# Patient Record
Sex: Female | Born: 1975 | Race: Black or African American | Hispanic: No | Marital: Single | Smoking: Current every day smoker
Health system: Southern US, Community
[De-identification: ages and names within clinical notes are randomized; demographics above are authoritative.]

---

## 2008-11-14 ENCOUNTER — Ambulatory Visit: Payer: Self-pay | Admitting: General Practice

## 2008-12-13 ENCOUNTER — Inpatient Hospital Stay: Payer: Self-pay | Admitting: Obstetrics and Gynecology

## 2011-05-26 IMAGING — US US OB US >=[ID] SNGL FETUS
1 series · 17 of 28 positions shown · non-contrast
Comparison: none

REASON FOR EXAM: vaginal bleeding   CALL report [id number]
COMMENTS:

[Series 1: us ob us >=(id) sngl fetus · 17 of 67 slices shown]
[im 1/67]
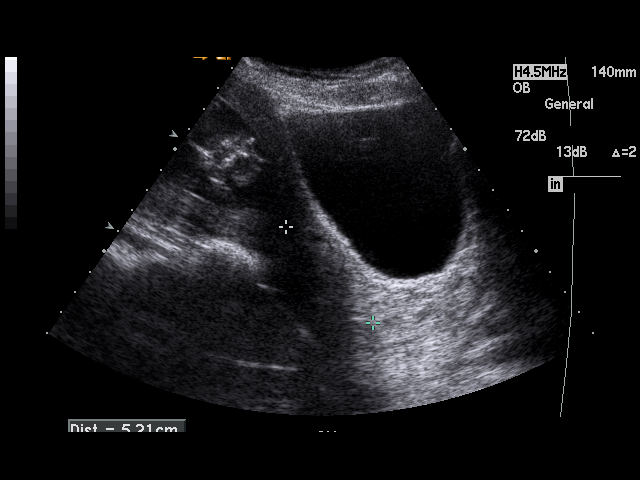
[im 5/67]
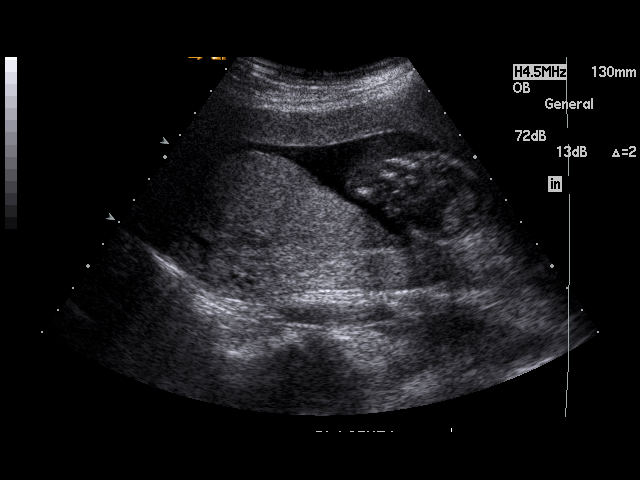
[im 10/67]
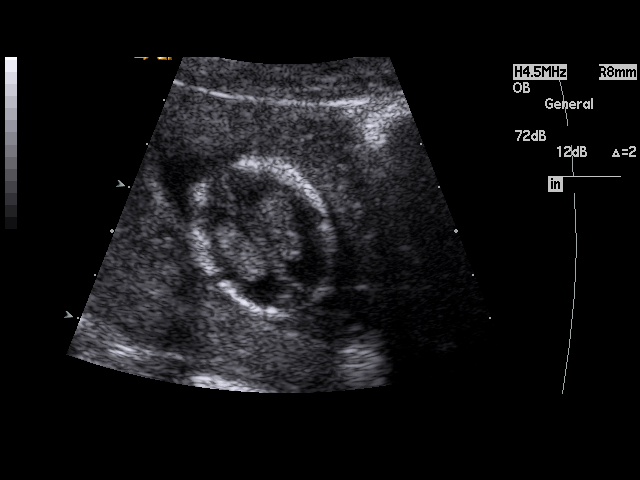
[im 13/67]
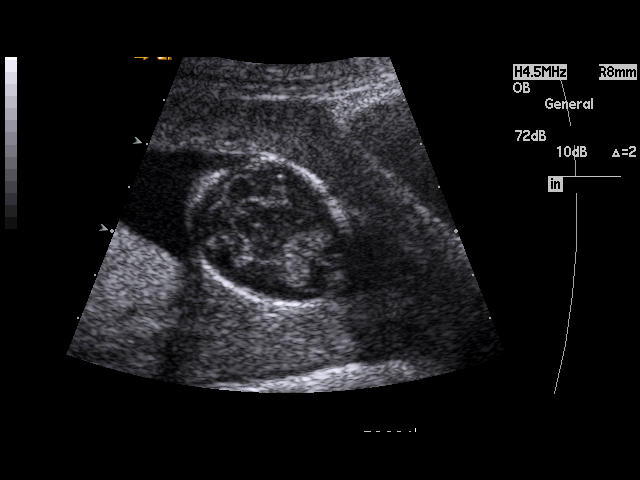
[im 18/67]
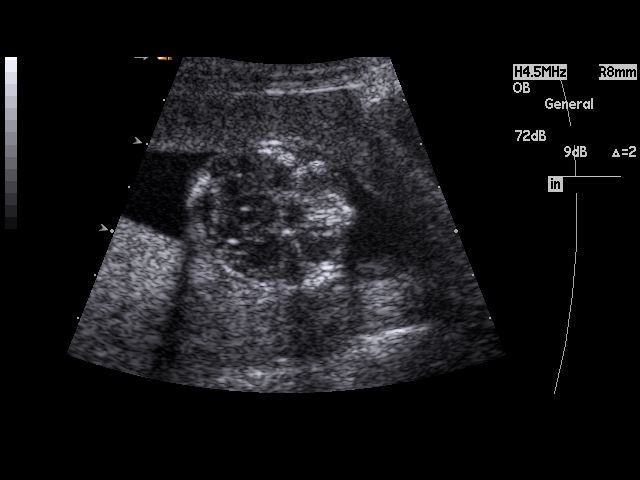
[im 23/67]
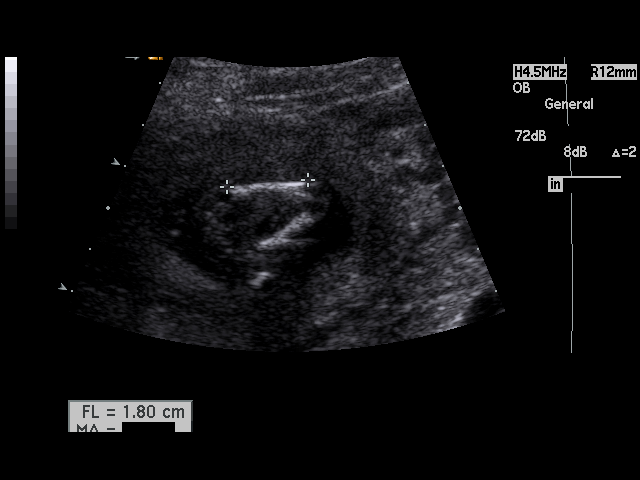
[im 25/67]
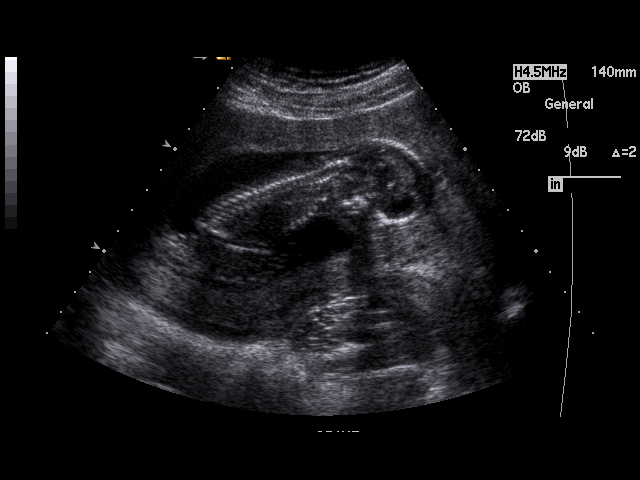
[im 30/67]
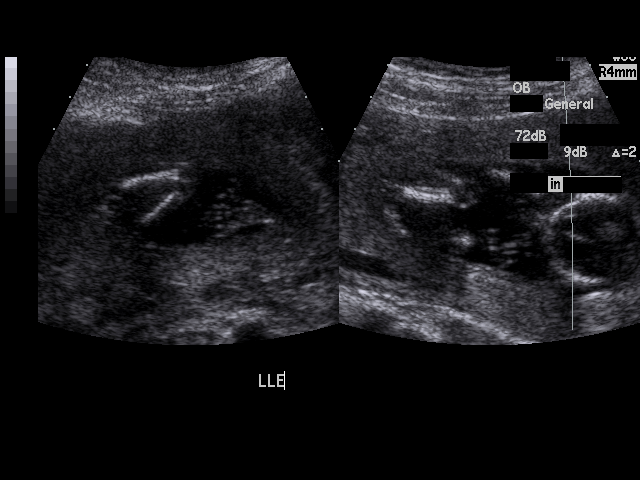
[im 35/67]
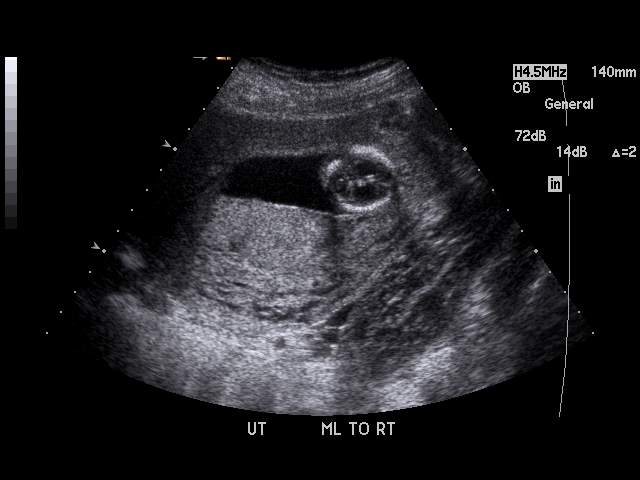
[im 37/67]
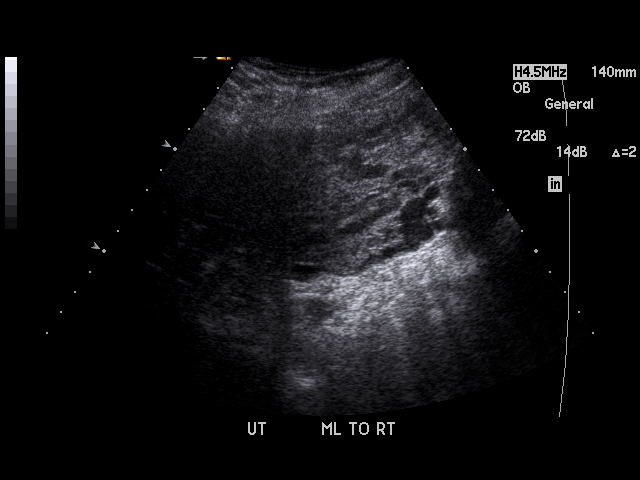
[im 42/67]
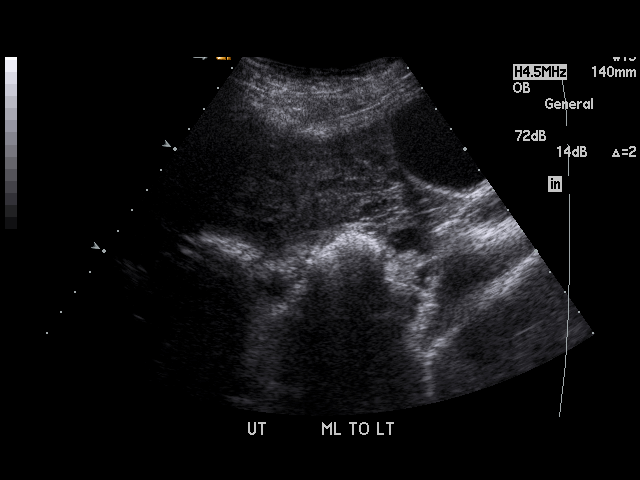
[im 45/67]
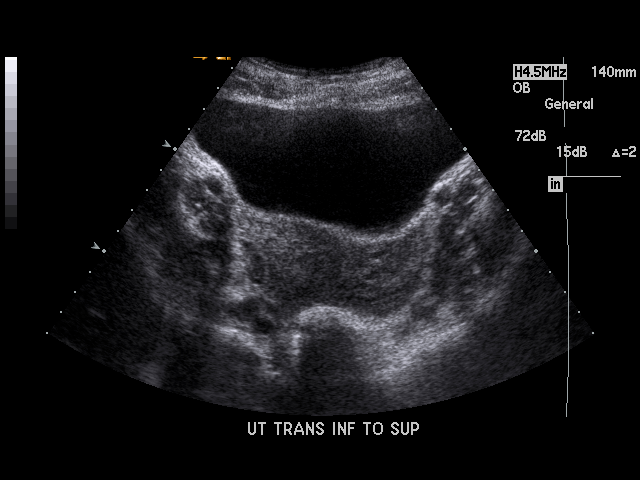
[im 49/67]
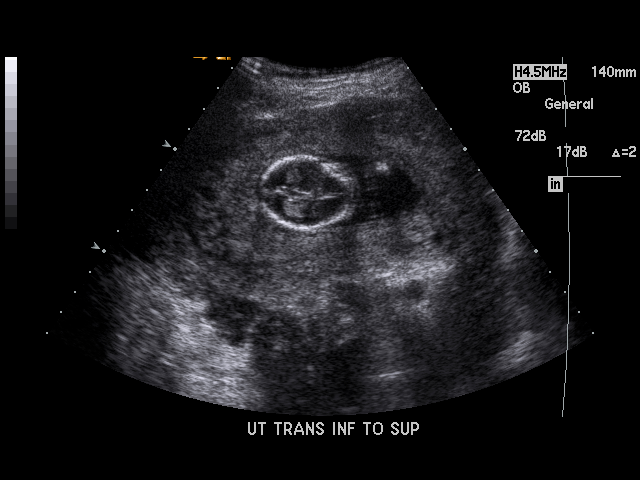
[im 54/67]
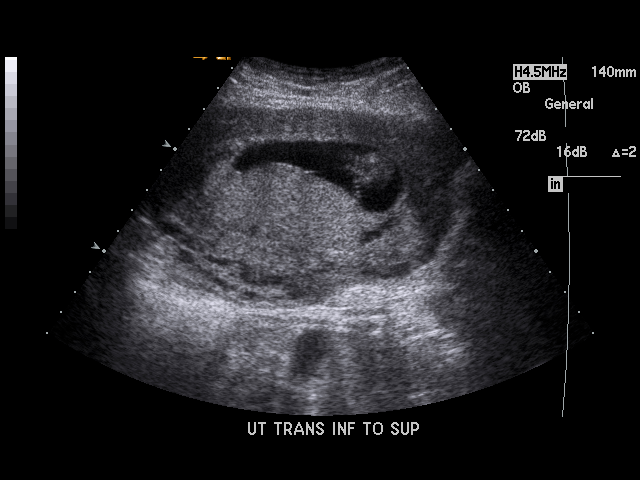
[im 57/67]
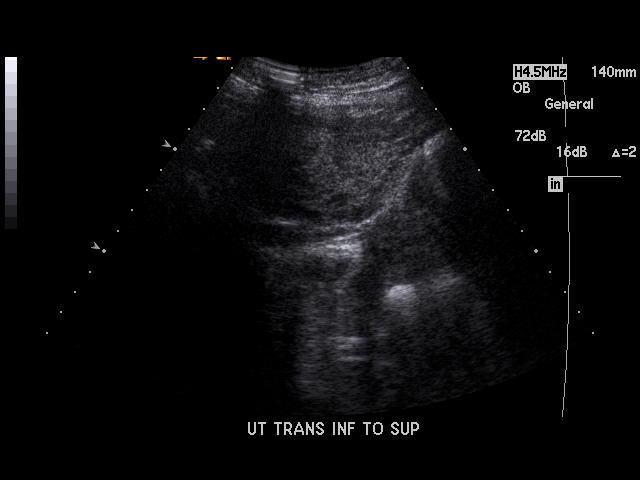
[im 62/67]
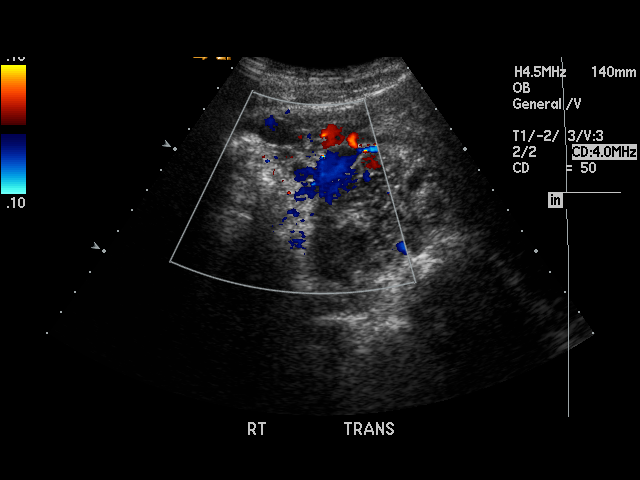
[im 67/67]
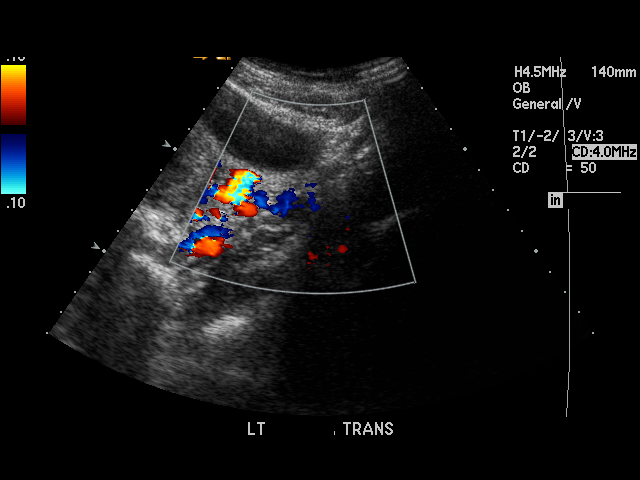

[17 of 28 positions shown; findings below may reference images not displayed]

PROCEDURE:     US  - US OB GREATER/OR EQUAL TO [id number]  - November 14, 2008  [DATE]

RESULT:     Ultrasound of the pelvis utilizing an OB protocol demonstrates a
single intrauterine fetus. The length of the cervix is 5.21 cm. The placenta
is posterior in position. The distance from the lower margin of the placenta
to the internal cervical os is 4.09 cm. Fetal cardiac, trunk and extremity
motion is demonstrated. The fetal heart rate is 153 beats per minute. There
is a cephalic presentation currently. The placenta is posterior in position
and grade 1. Gestational age is calculated to be 15 weeks one day with an
estimated fetal weight of 139 grams + / - 19 grams at this time. The fetus is
too small for accurate assessment of anatomy.  A gastric bubble is
demonstrated. Estimated delivery date sonographically is 05/07/2009.
IMPRESSION: No significant abnormality demonstrated. Single intrauterine fetus as
described. Routine followup is recommended.

## 2012-03-14 ENCOUNTER — Emergency Department: Payer: Self-pay | Admitting: Emergency Medicine

## 2013-05-14 ENCOUNTER — Emergency Department: Payer: Self-pay | Admitting: Emergency Medicine

## 2014-07-29 ENCOUNTER — Encounter: Payer: Self-pay | Admitting: Emergency Medicine

## 2014-07-29 ENCOUNTER — Emergency Department
Admission: EM | Admit: 2014-07-29 | Discharge: 2014-07-29 | Disposition: A | Discharge status: Home / Self Care | Payer: Self-pay | Attending: Emergency Medicine | Admitting: Emergency Medicine

## 2014-07-29 DIAGNOSIS — Z720 Tobacco use: Secondary | ICD-10-CM | POA: Insufficient documentation

## 2014-07-29 DIAGNOSIS — J039 Acute tonsillitis, unspecified: Secondary | ICD-10-CM

## 2014-07-29 MED ORDER — DEXAMETHASONE SODIUM PHOSPHATE 10 MG/ML IJ SOLN
10.0000 mg | Freq: Once | INTRAMUSCULAR | Status: DC
Start: 2014-07-29 — End: 2014-07-29

## 2014-07-29 MED ORDER — PENICILLIN G BENZATHINE & PROC 1200000 UNIT/2ML IM SUSP
1.2000 10*6.[IU] | Freq: Once | INTRAMUSCULAR | Status: DC
  Filled 2014-07-29: qty 2

## 2014-07-29 MED ORDER — LIDOCAINE VISCOUS 2 % MT SOLN: 20.0000 mL | OROMUCOSAL | Status: DC | PRN

## 2014-07-29 MED ORDER — DEXAMETHASONE SODIUM PHOSPHATE 10 MG/ML IJ SOLN
10.0000 mg | Freq: Once | INTRAMUSCULAR | Status: AC
  Administered 2014-07-29: 10 mg via INTRAMUSCULAR

## 2014-07-29 MED ORDER — PENICILLIN G BENZATHINE 1200000 UNIT/2ML IM SUSP
1.2000 10*6.[IU] | Freq: Once | INTRAMUSCULAR | Status: AC
  Administered 2014-07-29: 1.2 10*6.[IU] via INTRAMUSCULAR
  Filled 2014-07-29: qty 2

## 2014-07-29 MED ORDER — PENICILLIN G BENZATHINE & PROC 1200000 UNIT/2ML IM SUSP: 1.2000 10*6.[IU] | Freq: Once | INTRAMUSCULAR | Status: DC

## 2014-07-29 MED ORDER — PREDNISONE 10 MG PO TABS: ORAL_TABLET | ORAL | Status: DC

## 2014-07-29 NOTE — Discharge Instructions (Signed)

## 2014-07-29 NOTE — ED Notes (Signed)
Pt here with c/o sore throat, swollen tonsils x2 weeks. Pt reports difficulty swallowing, some difficulty breathing. Pt ambulatory to room, no distress noted.

## 2014-07-29 NOTE — ED Provider Notes (Signed)
[facility] Emergency Department Provider Note  ____________________________________________  Time seen: Approximately 1:59 PM  I have reviewed the triage vital signs and the nursing notes.   HISTORY  Chief Complaint Sore Throat    HPI Rebecca [name] is a 39 y.o. female who presents to the emergency room with complaints of a sore throat, swollen tonsils, with some difficulty swallowing and breathing. Symptoms onset about 2 weeks ago and have progressively gotten worse. She admits to having fever chills body aches.   History reviewed. No pertinent past medical history.  There are no active problems to display for this patient.   History reviewed. No pertinent past surgical history.  Current Outpatient Rx  Name  Route  Sig  Dispense  Refill  . lidocaine (XYLOCAINE) 2 % solution   Mouth/Throat   Use as directed 20 mLs in the mouth or throat as needed for mouth pain.   100 mL   0   . predniSONE (DELTASONE) 10 MG tablet      Take 6 tablets on day 1 then decrease by 1 tablet daily for six days.   21 tablet   0     Allergies Review of patient's allergies indicates no known allergies.  No family history on file.  Social History History  Substance Use Topics  . Smoking status: Current Every Day Smoker    Types: Cigarettes  . Smokeless tobacco: Not on file  . Alcohol Use: No    Review of Systems Constitutional: Positive fever, chills Eyes: No visual changes. ENT: Positive sore throat with swallowing. Cardiovascular: Denies chest pain. Respiratory: Denies shortness of breath. Gastrointestinal: No abdominal pain.  No nausea, no vomiting.  No diarrhea.  No constipation. Genitourinary: Negative for dysuria. Musculoskeletal: Negative for back pain. Skin: Negative for rash. Neurological: Negative for headaches, focal weakness or numbness.  10-point ROS otherwise negative.  ____________________________________________   PHYSICAL  EXAM:  VITAL SIGNS: ED Triage Vitals  Enc Vitals Group     BP 07/29/14 1344 151/100 mmHg     Pulse Rate 07/29/14 1344 88     Resp 07/29/14 1344 20     Temp 07/29/14 1344 101.1 F (38.4 C)     Temp Source 07/29/14 1344 Oral     SpO2 07/29/14 1344 98 %     Weight 07/29/14 1344 145 lb (65.772 kg)     Height 07/29/14 1344 5\' 5"  (1.651 m)     Head Cir --      Peak Flow --      Pain Score 07/29/14 1345 10     Pain Loc --      Pain Edu? --      Excl. in GC? --     Constitutional: Alert and oriented. Well appearing and in no acute distress. Eyes: Conjunctivae are normal. PERRL. EOMI. Head: Atraumatic. Nose: No congestion/rhinnorhea. Mouth/Throat: Mucous membranes are moist.  Oropharynx . Erythematous with 3+ tonsillar edema with exudate. Neck: No stridor.   Hematological/Lymphatic/Immunilogical: No cervical lymphadenopathy. Cardiovascular: Normal rate, regular rhythm. Grossly normal heart sounds.  Good peripheral circulation. Respiratory: Normal respiratory effort.  No retractions. Lungs CTAB. Gastrointestinal: Soft and nontender. No distention. No abdominal bruits. No CVA tenderness. Musculoskeletal: No lower extremity tenderness nor edema.  No joint effusions. Neurologic:  Normal speech and language. No gross focal neurologic deficits are appreciated. Speech is normal. No gait instability. Skin:  Skin is warm, dry and intact. No rash noted. Psychiatric: Mood and affect are normal. Speech and behavior are normal.  ____________________________________________   LABS (all labs ordered are listed, but only abnormal results are displayed)  Labs Reviewed - No data to display ____________________________________________  EKG  Deferred ____________________________________________  RADIOLOGY  Deferred ____________________________________________   PROCEDURES  Procedure(s) performed: None  Critical Care performed:  No  ____________________________________________   INITIAL IMPRESSION / ASSESSMENT AND PLAN / ED COURSE  Pertinent labs & imaging results that were available during my care of the patient were reviewed by me and considered in my medical decision making (see chart for details).  Bicillin 1.2 million units IM given. Decadron 10 mg IM given. Patient discharged home on 6 day tapering dose of prednisone and viscous lidocaine. She understands to return to the ER with worsening of symptoms. She voices no other emergency medical complaints at this time. ____________________________________________   FINAL CLINICAL IMPRESSION(S) / ED DIAGNOSES  Final diagnoses:  Tonsillitis with exudate      Evangeline Dakin, PA-C 07/29/14 1456  Sharman Cheek, MD 07/29/14 [phone]

## 2016-03-10 ENCOUNTER — Encounter: Payer: Self-pay | Admitting: *Deleted

## 2016-03-10 ENCOUNTER — Emergency Department
Admission: EM | Admit: 2016-03-10 | Discharge: 2016-03-10 | Disposition: A | Discharge status: Home / Self Care | Payer: Self-pay | Attending: Emergency Medicine | Admitting: Emergency Medicine

## 2016-03-10 DIAGNOSIS — F17210 Nicotine dependence, cigarettes, uncomplicated: Secondary | ICD-10-CM | POA: Insufficient documentation

## 2016-03-10 DIAGNOSIS — J111 Influenza due to unidentified influenza virus with other respiratory manifestations: Secondary | ICD-10-CM | POA: Insufficient documentation

## 2016-03-10 MED ORDER — PSEUDOEPH-BROMPHEN-DM 30-2-10 MG/5ML PO SYRP: 10.0000 mL | ORAL_SOLUTION | Freq: Four times a day (QID) | ORAL | 0 refills | Status: DC | PRN

## 2016-03-10 MED ORDER — FLUTICASONE PROPIONATE 50 MCG/ACT NA SUSP: 1.0000 | Freq: Two times a day (BID) | NASAL | 0 refills | Status: DC

## 2016-03-10 MED ORDER — OSELTAMIVIR PHOSPHATE 75 MG PO CAPS: 75.0000 mg | ORAL_CAPSULE | Freq: Two times a day (BID) | ORAL | 0 refills | Status: DC

## 2016-03-10 MED ORDER — ONDANSETRON 4 MG PO TBDP: 4.0000 mg | ORAL_TABLET | Freq: Three times a day (TID) | ORAL | 0 refills | Status: DC | PRN

## 2016-03-10 MED ORDER — ACETAMINOPHEN 325 MG PO TABS
ORAL_TABLET | ORAL | Status: DC
Start: 2016-03-10 — End: 2016-03-10
  Filled 2016-03-10: qty 2

## 2016-03-10 MED ORDER — ACETAMINOPHEN 325 MG PO TABS
650.0000 mg | ORAL_TABLET | Freq: Once | ORAL | Status: AC | PRN
  Administered 2016-03-10: 650 mg via ORAL

## 2016-03-10 NOTE — ED Notes (Signed)
Pt reports she has not taken anything for fever at home. PT in triage requesting food.

## 2016-03-10 NOTE — ED Provider Notes (Signed)
[facility] Emergency Department Provider Note  ____________________________________________  Time seen: Approximately 5:34 PM  I have reviewed the triage vital signs and the nursing notes.   HISTORY  Chief Complaint Influenza    HPI Rebecca [name] is a 41 y.o. female who presents emergency department complaining of sudden onset of fevers and chills, body aches, nasal congestion, sore throat, coughing, abdominal cramps. Patient reports that symptoms hit her suddenly yesterday. She has not taken any medications at home for this complaint. Patient did not get the flu shot this year. She denies any headache, visual changes, chest pain, shortness of breath, diarrhea. Patient states that she has had some nausea. She reports not having a bowel movement since yesterday.   No past medical history on file.  There are no active problems to display for this patient.   No past surgical history on file.  Prior to Admission medications   Medication Sig Start Date End Date Taking? Authorizing Provider  brompheniramine-pseudoephedrine-DM 30-2-10 MG/5ML syrup Take 10 mLs by mouth 4 (four) times daily as needed. 03/10/16   Delorise Royals Payten Beaumier, PA-C  fluticasone (FLONASE) 50 MCG/ACT nasal spray Place 1 spray into both nostrils 2 (two) times daily. 03/10/16   Delorise Royals Sohail Capraro, PA-C  lidocaine (XYLOCAINE) 2 % solution Use as directed 20 mLs in the mouth or throat as needed for mouth pain. 07/29/14   Charmayne Sheer Beers, PA-C  ondansetron (ZOFRAN-ODT) 4 MG disintegrating tablet Take 1 tablet (4 mg total) by mouth every 8 (eight) hours as needed for nausea or vomiting. 03/10/16   Delorise Royals Haiven Nardone, PA-C  oseltamivir (TAMIFLU) 75 MG capsule Take 1 capsule (75 mg total) by mouth 2 (two) times daily. 03/10/16   Delorise Royals Tyree Vandruff, PA-C  predniSONE (DELTASONE) 10 MG tablet Take 6 tablets on day 1 then decrease by 1 tablet daily for six days. 07/29/14   Evangeline Dakin, PA-C     Allergies Patient has no known allergies.  No family history on file.  Social History Social History  Substance Use Topics  . Smoking status: Current Every Day Smoker    Types: Cigarettes  . Smokeless tobacco: Never Used  . Alcohol use No     Review of Systems  Constitutional: Positive fever/chills Eyes: No visual changes.  ENT: Positive for nasal congestion and sore throat. Cardiovascular: no chest pain. Respiratory: Positive cough. No SOB. Gastrointestinal: Positive for abdominal cramps..  Positive for nausea but no vomiting.  No diarrhea.  No constipation. Genitourinary: Negative for dysuria. No hematuria Musculoskeletal: Positive for generalized body aches Skin: Negative for rash, abrasions, lacerations, ecchymosis. Neurological: Negative for headaches, focal weakness or numbness. 10-point ROS otherwise negative.  ____________________________________________   PHYSICAL EXAM:  VITAL SIGNS: ED Triage Vitals  Enc Vitals Group     BP 03/10/16 1724 124/83     Pulse Rate 03/10/16 1724 97     Resp 03/10/16 1724 16     Temp 03/10/16 1724 (!) 102.5 F (39.2 C)     Temp Source 03/10/16 1724 Oral     SpO2 03/10/16 1724 99 %     Weight 03/10/16 1725 135 lb (61.2 kg)     Height 03/10/16 1725 5\' 5"  (1.651 m)     Head Circumference --      Peak Flow --      Pain Score 03/10/16 1725 10     Pain Loc --      Pain Edu? --      Excl. in GC? --  Constitutional: Alert and oriented. Well appearing and in no acute distress. Eyes: Conjunctivae are normal. PERRL. EOMI. Head: Atraumatic. ENT:      Ears: EACs and TMs unremarkable bilaterally.      Nose: Moderate congestion/rhinnorhea.      Mouth/Throat: Mucous membranes are moist. Pharynx is mildly erythematous but nonedematous. Uvula is midline. Tonsils are unremarkable bilaterally. Neck: No stridor.  No cervical spine tenderness to palpation Hematological/Lymphatic/Immunilogical: Diffuse,, nontender anterior cervical  lymphadenopathy. Cardiovascular: Normal rate, regular rhythm. Normal S1 and S2.  Good peripheral circulation. Respiratory: Normal respiratory effort without tachypnea or retractions. Lungs CTAB. Good air entry to the bases with no decreased or absent breath sounds. Gastrointestinal: Bowel sounds 4 quadrants. Soft and nontender to palpation. No guarding or rigidity. No palpable masses. No distention. No CVA tenderness. Musculoskeletal: Full range of motion to all extremities. No gross deformities appreciated. Neurologic:  Normal speech and language. No gross focal neurologic deficits are appreciated.  Skin:  Skin is warm, dry and intact. No rash noted. Psychiatric: Mood and affect are normal. Speech and behavior are normal. Patient exhibits appropriate insight and judgement.   ____________________________________________   LABS (all labs ordered are listed, but only abnormal results are displayed)  Labs Reviewed - No data to display ____________________________________________  EKG   ____________________________________________  RADIOLOGY   No results found.  ____________________________________________    PROCEDURES  Procedure(s) performed:    Procedures    Medications  acetaminophen (TYLENOL) tablet 650 mg (650 mg Oral Given 03/10/16 1729)     ____________________________________________   INITIAL IMPRESSION / ASSESSMENT AND PLAN / ED COURSE  Pertinent labs & imaging results that were available during my care of the patient were reviewed by me and considered in my medical decision making (see chart for details).  Review of the [place] CSRS was performed in accordance of the [facility] prior to dispensing any controlled drugs.     Patient's diagnosis is consistent with Influenza. Patient had sudden onset of influenza-like symptoms. At this time, patient will be diagnosed and treated symptomatically. No testing undertaken at this time.. Patient will be discharged home  with prescriptions for Tamiflu, Flonase, cough syrup, Zofran. Patient is to follow up with primary care as needed or otherwise directed. Patient is given ED precautions to return to the ED for any worsening or new symptoms.     ____________________________________________  FINAL CLINICAL IMPRESSION(S) / ED DIAGNOSES  Final diagnoses:  Influenza      NEW MEDICATIONS STARTED DURING THIS VISIT:  New Prescriptions   BROMPHENIRAMINE-PSEUDOEPHEDRINE-DM 30-2-10 MG/5ML SYRUP    Take 10 mLs by mouth 4 (four) times daily as needed.   FLUTICASONE (FLONASE) 50 MCG/ACT NASAL SPRAY    Place 1 spray into both nostrils 2 (two) times daily.   ONDANSETRON (ZOFRAN-ODT) 4 MG DISINTEGRATING TABLET    Take 1 tablet (4 mg total) by mouth every 8 (eight) hours as needed for nausea or vomiting.   OSELTAMIVIR (TAMIFLU) 75 MG CAPSULE    Take 1 capsule (75 mg total) by mouth 2 (two) times daily.        This chart was dictated using voice recognition software/Dragon. Despite best efforts to proofread, errors can occur which can change the meaning. Any change was purely unintentional.    Racheal PatchesJonathan D Jnai Snellgrove, PA-C 03/10/16 1744    Arnaldo NatalPaul F Malinda, MD 03/10/16 2227

## 2016-03-10 NOTE — ED Notes (Signed)
See triage note  Fever today with body aches

## 2016-03-10 NOTE — ED Triage Notes (Signed)
Pt arrived to ED from home reporting flu like symptoms that began yesterday. Pt reports full body aches and chills, fevers at home and cough. Pt reports having 1 episode of emesis yesterday. Pt also reports she feels  constipated over the past two days. Pt having no SOB at this time.

## 2016-03-10 NOTE — ED Triage Notes (Signed)
Per EMS report, patient c/o flu-like symptoms since last night. Patient has 103.6 temp, c/o headache, body aches, sore throat and cough. Patient states she vomited yesterday.

## 2016-03-23 ENCOUNTER — Encounter: Payer: Self-pay | Admitting: Emergency Medicine

## 2016-03-23 ENCOUNTER — Emergency Department: Payer: Self-pay

## 2016-03-23 ENCOUNTER — Inpatient Hospital Stay
Admission: EM | Admit: 2016-03-23 | Discharge: 2016-03-24 | DRG: 871 | Disposition: A | Discharge status: Home / Self Care | Payer: Self-pay | Attending: Internal Medicine | Admitting: Internal Medicine

## 2016-03-23 DIAGNOSIS — J440 Chronic obstructive pulmonary disease with acute lower respiratory infection: Secondary | ICD-10-CM | POA: Diagnosis present

## 2016-03-23 DIAGNOSIS — F17210 Nicotine dependence, cigarettes, uncomplicated: Secondary | ICD-10-CM | POA: Diagnosis present

## 2016-03-23 DIAGNOSIS — J189 Pneumonia, unspecified organism: Secondary | ICD-10-CM

## 2016-03-23 DIAGNOSIS — R0902 Hypoxemia: Secondary | ICD-10-CM

## 2016-03-23 DIAGNOSIS — J181 Lobar pneumonia, unspecified organism: Secondary | ICD-10-CM

## 2016-03-23 DIAGNOSIS — Z590 Homelessness: Secondary | ICD-10-CM

## 2016-03-23 DIAGNOSIS — J9601 Acute respiratory failure with hypoxia: Secondary | ICD-10-CM | POA: Diagnosis present

## 2016-03-23 DIAGNOSIS — Z23 Encounter for immunization: Secondary | ICD-10-CM

## 2016-03-23 DIAGNOSIS — A419 Sepsis, unspecified organism: Principal | ICD-10-CM | POA: Diagnosis present

## 2016-03-23 DIAGNOSIS — J441 Chronic obstructive pulmonary disease with (acute) exacerbation: Secondary | ICD-10-CM | POA: Diagnosis present

## 2016-03-23 LAB — COMPREHENSIVE METABOLIC PANEL
ALBUMIN: 2.9 g/dL — AB (ref 3.5–5.0)
ALT: 16 U/L (ref 14–54)
AST: 23 U/L (ref 15–41)
Alkaline Phosphatase: 54 U/L (ref 38–126)
Anion gap: 9 (ref 5–15)
BUN: 11 mg/dL (ref 6–20)
CHLORIDE: 102 mmol/L (ref 101–111)
CO2: 27 mmol/L (ref 22–32)
Calcium: 8.9 mg/dL (ref 8.9–10.3)
Creatinine, Ser: 0.91 mg/dL (ref 0.44–1.00)
GFR calc non Af Amer: >60 mL/min (ref >60)
Glucose, Bld: 103 mg/dL — ABNORMAL HIGH (ref 65–99)
POTASSIUM: 3.9 mmol/L (ref 3.5–5.1)
SODIUM: 138 mmol/L (ref 135–145)
Total Bilirubin: 0.7 mg/dL (ref 0.3–1.2)
Total Protein: 8.5 g/dL — ABNORMAL HIGH (ref 6.5–8.1)

## 2016-03-23 LAB — INFLUENZA PANEL BY PCR (TYPE A & B)
INFLBPCR: NEGATIVE
Influenza A By PCR: NEGATIVE

## 2016-03-23 LAB — TSH: TSH: 0.924 u[IU]/mL (ref 0.350–4.500)

## 2016-03-23 LAB — CBC
HCT: 35.6 % (ref 35.0–47.0)
Hemoglobin: 12 g/dL (ref 12.0–16.0)
MCH: 29.2 pg (ref 26.0–34.0)
MCHC: 33.7 g/dL (ref 32.0–36.0)
MCV: 86.8 fL (ref 80.0–100.0)
PLATELETS: 525 10*3/uL — AB (ref 150–440)
RBC: 4.1 MIL/uL (ref 3.80–5.20)
RDW: 14.2 % (ref 11.5–14.5)
WBC: 12.5 10*3/uL — ABNORMAL HIGH (ref 3.6–11.0)

## 2016-03-23 LAB — LACTIC ACID, PLASMA
Lactic Acid, Venous: 0.8 mmol/L (ref 0.5–1.9)
Lactic Acid, Venous: 1.3 mmol/L (ref 0.5–1.9)

## 2016-03-23 LAB — TROPONIN I

## 2016-03-23 MED ORDER — MORPHINE SULFATE (PF) 4 MG/ML IV SOLN
4.0000 mg | Freq: Once | INTRAVENOUS | Status: DC
  Filled 2016-03-23: qty 1

## 2016-03-23 MED ORDER — ACETAMINOPHEN 650 MG RE SUPP
650.0000 mg | Freq: Four times a day (QID) | RECTAL | Status: DC | PRN
Start: 2016-03-23 — End: 2016-03-24

## 2016-03-23 MED ORDER — SODIUM CHLORIDE 0.9% FLUSH
3.0000 mL | Freq: Two times a day (BID) | INTRAVENOUS | Status: DC
  Administered 2016-03-23 – 2016-03-24 (×3): 3 mL via INTRAVENOUS

## 2016-03-23 MED ORDER — SODIUM CHLORIDE 0.9 % IV SOLN
INTRAVENOUS | Status: DC
  Administered 2016-03-23: 11:00:00 via INTRAVENOUS

## 2016-03-23 MED ORDER — ENOXAPARIN SODIUM 40 MG/0.4ML ~~LOC~~ SOLN
40.0000 mg | SUBCUTANEOUS | Status: DC
  Administered 2016-03-23 – 2016-03-24 (×2): 40 mg via SUBCUTANEOUS
  Filled 2016-03-23 (×2): qty 0.4

## 2016-03-23 MED ORDER — CEFTRIAXONE SODIUM-DEXTROSE 1-3.74 GM-% IV SOLR
1.0000 g | Freq: Once | INTRAVENOUS | Status: AC
  Administered 2016-03-23: 1 g via INTRAVENOUS
  Filled 2016-03-23: qty 50

## 2016-03-23 MED ORDER — IPRATROPIUM-ALBUTEROL 0.5-2.5 (3) MG/3ML IN SOLN
3.0000 mL | Freq: Once | RESPIRATORY_TRACT | Status: AC
  Administered 2016-03-23: 3 mL via RESPIRATORY_TRACT
  Filled 2016-03-23: qty 3

## 2016-03-23 MED ORDER — ONDANSETRON HCL 4 MG/2ML IJ SOLN
4.0000 mg | Freq: Once | INTRAMUSCULAR | Status: DC
Start: 2016-03-23 — End: 2016-03-23
  Filled 2016-03-23: qty 2

## 2016-03-23 MED ORDER — AZITHROMYCIN 250 MG PO TABS
250.0000 mg | ORAL_TABLET | Freq: Every day | ORAL | Status: DC
  Administered 2016-03-24: 04:00:00 250 mg via ORAL
  Filled 2016-03-23: qty 1

## 2016-03-23 MED ORDER — METHYLPREDNISOLONE SODIUM SUCC 125 MG IJ SOLR
60.0000 mg | INTRAMUSCULAR | Status: DC
  Administered 2016-03-23 – 2016-03-24 (×2): 60 mg via INTRAVENOUS
  Filled 2016-03-23 (×2): qty 2

## 2016-03-23 MED ORDER — FUROSEMIDE 10 MG/ML IJ SOLN
40.0000 mg | Freq: Once | INTRAMUSCULAR | Status: DC
  Filled 2016-03-23: qty 4

## 2016-03-23 MED ORDER — ONDANSETRON HCL 4 MG PO TABS: 4.0000 mg | ORAL_TABLET | Freq: Four times a day (QID) | ORAL | Status: DC | PRN

## 2016-03-23 MED ORDER — CEFTRIAXONE SODIUM-DEXTROSE 1-3.74 GM-% IV SOLR
1.0000 g | INTRAVENOUS | Status: DC
  Administered 2016-03-24: 1 g via INTRAVENOUS
  Filled 2016-03-23: qty 50

## 2016-03-23 MED ORDER — INFLUENZA VAC SPLIT QUAD 0.5 ML IM SUSY
0.5000 mL | PREFILLED_SYRINGE | INTRAMUSCULAR | Status: AC
  Administered 2016-03-24: 10:00:00 0.5 mL via INTRAMUSCULAR
  Filled 2016-03-23: qty 0.5

## 2016-03-23 MED ORDER — IPRATROPIUM-ALBUTEROL 0.5-2.5 (3) MG/3ML IN SOLN
3.0000 mL | RESPIRATORY_TRACT | Status: AC
  Administered 2016-03-23 (×2): 3 mL via RESPIRATORY_TRACT
  Filled 2016-03-23 (×4): qty 3

## 2016-03-23 MED ORDER — IPRATROPIUM-ALBUTEROL 0.5-2.5 (3) MG/3ML IN SOLN: 3.0000 mL | RESPIRATORY_TRACT | Status: DC

## 2016-03-23 MED ORDER — GUAIFENESIN-DM 100-10 MG/5ML PO SYRP
5.0000 mL | ORAL_SOLUTION | ORAL | Status: DC | PRN
  Administered 2016-03-24 (×2): 5 mL via ORAL
  Filled 2016-03-23 (×2): qty 5

## 2016-03-23 MED ORDER — PNEUMOCOCCAL VAC POLYVALENT 25 MCG/0.5ML IJ INJ
0.5000 mL | INJECTION | INTRAMUSCULAR | Status: AC
  Administered 2016-03-24: 10:00:00 0.5 mL via INTRAMUSCULAR
  Filled 2016-03-23: qty 0.5

## 2016-03-23 MED ORDER — ONDANSETRON HCL 4 MG/2ML IJ SOLN: 4.0000 mg | Freq: Four times a day (QID) | INTRAMUSCULAR | Status: DC | PRN

## 2016-03-23 MED ORDER — IPRATROPIUM-ALBUTEROL 0.5-2.5 (3) MG/3ML IN SOLN
3.0000 mL | Freq: Four times a day (QID) | RESPIRATORY_TRACT | Status: DC | PRN
  Administered 2016-03-24: 3 mL via RESPIRATORY_TRACT

## 2016-03-23 MED ORDER — DEXTROSE 5 % IV SOLN
500.0000 mg | Freq: Once | INTRAVENOUS | Status: AC
  Administered 2016-03-23: 500 mg via INTRAVENOUS
  Filled 2016-03-23: qty 500

## 2016-03-23 MED ORDER — ACETAMINOPHEN 325 MG PO TABS: 650.0000 mg | ORAL_TABLET | Freq: Four times a day (QID) | ORAL | Status: DC | PRN

## 2016-03-23 MED ORDER — DOCUSATE SODIUM 100 MG PO CAPS
100.0000 mg | ORAL_CAPSULE | Freq: Two times a day (BID) | ORAL | Status: DC
  Administered 2016-03-23: 100 mg via ORAL
  Filled 2016-03-23 (×3): qty 1

## 2016-03-23 MED ORDER — DEXTROSE 5 % IV SOLN: 1.0000 g | Freq: Once | INTRAVENOUS | Status: DC

## 2016-03-23 MED ORDER — CEFTRIAXONE SODIUM 1 G IJ SOLR: 1.0000 g | INTRAMUSCULAR | Status: DC

## 2016-03-23 NOTE — ED Notes (Signed)
Patient transported to X-ray 

## 2016-03-23 NOTE — ED Notes (Addendum)
Pt's oxygen saturation dropped to 98%, pt's oxygen increased to 3L.

## 2016-03-23 NOTE — H&P (Signed)
Rebecca [name] is an 41 y.o. female.   Chief Complaint: Shortness of breath HPI: The patient with no chronic medical illnesses presents emergency department complaining of shortness of breath. She states that for the last few days she has been unable to walk without stopping every 5 minutes due to dyspnea. She also thinks that she may have fainted at some point. The patient was treated for the flu last week but since that time has been coughing and bringing up thick white to yellow phlegm. In the emergency department the patient was found to be very tachypneic and rhonchorous. Chest x-ray confirmed bilateral patchy infiltrates as well as right lower lobe consolidation consistent with pneumonia. Oxygen saturations were 89% on room air with further desaturation with ambulation. Thus the emergency department staff called the hospitalist service for admission.  History reviewed. No pertinent past medical history. None  Past Surgical History:  Procedure Laterality Date  . CESAREAN SECTION      Family History  Problem Relation Age of Onset  . Cancer Mother    Social History:  reports that she has been smoking Cigarettes.  She has never used smokeless tobacco. She reports that she does not drink alcohol. Her drug history is not on file.  Allergies: No Known Allergies  Prior to Admission medications   Not on File   None  Results for orders placed or performed during the hospital encounter of 03/23/16 (from the past 48 hour(s))  CBC     Status: Abnormal   Collection Time: 03/23/16  2:09 AM  Result Value Ref Range   WBC 12.5 (H) 3.6 - 11.0 K/uL   RBC 4.10 3.80 - 5.20 MIL/uL   Hemoglobin 12.0 12.0 - 16.0 g/dL   HCT 35.6 35.0 - 47.0 %   MCV 86.8 80.0 - 100.0 fL   MCH 29.2 26.0 - 34.0 pg   MCHC 33.7 32.0 - 36.0 g/dL   RDW 14.2 11.5 - 14.5 %   Platelets 525 (H) 150 - 440 K/uL  Comprehensive metabolic panel     Status: Abnormal   Collection Time: 03/23/16  2:09 AM  Result Value Ref Range    Sodium 138 135 - 145 mmol/L   Potassium 3.9 3.5 - 5.1 mmol/L   Chloride 102 101 - 111 mmol/L   CO2 27 22 - 32 mmol/L   Glucose, Bld 103 (H) 65 - 99 mg/dL   BUN 11 6 - 20 mg/dL   Creatinine, Ser 0.91 0.44 - 1.00 mg/dL   Calcium 8.9 8.9 - 10.3 mg/dL   Total Protein 8.5 (H) 6.5 - 8.1 g/dL   Albumin 2.9 (L) 3.5 - 5.0 g/dL   AST 23 15 - 41 U/L   ALT 16 14 - 54 U/L   Alkaline Phosphatase 54 38 - 126 U/L   Total Bilirubin 0.7 0.3 - 1.2 mg/dL   GFR calc non Af Amer >60 >60 mL/min   GFR calc Af Amer >60 >60 mL/min    Comment: (NOTE) The eGFR has been calculated using the CKD EPI equation. This calculation has not been validated in all clinical situations. eGFR's persistently <60 mL/min signify possible Chronic Kidney Disease.    Anion gap 9 5 - 15  Troponin I     Status: None   Collection Time: 03/23/16  2:09 AM  Result Value Ref Range   Troponin I <0.03 <0.03 ng/mL  Influenza panel by PCR (type A & B)     Status: None   Collection Time: 03/23/16  2:33 AM  Result Value Ref Range   Influenza A By PCR NEGATIVE NEGATIVE   Influenza B By PCR NEGATIVE NEGATIVE    Comment: (NOTE) The Xpert Xpress Flu assay is intended as an aid in the diagnosis of  influenza and should not be used as a sole basis for treatment.  This  assay is FDA approved for nasopharyngeal swab specimens only. Nasal  washings and aspirates are unacceptable for Xpert Xpress Flu testing.   Lactic acid, plasma     Status: None   Collection Time: 03/23/16  6:42 AM  Result Value Ref Range   Lactic Acid, Venous 0.8 0.5 - 1.9 mmol/L   Dg Chest 2 View  Result Date: 03/23/2016 CLINICAL DATA:  Syncope, cough and fever x1 week EXAM: CHEST  2 VIEW COMPARISON:  None. FINDINGS: The heart size and mediastinal contours are within normal limits. Lungs are hyperinflated. Right upper lobe pneumonic consolidation is noted without effusion or pneumothorax. No overt pulmonary edema. The visualized skeletal structures are unremarkable.  IMPRESSION: Patchy airspace disease in the right upper lobe consistent with pneumonia. Emphysematous hyperinflation of lungs. Electronically Signed   By: Ashley Royalty M.D.   On: 03/23/2016 02:36    Review of Systems  Constitutional: Negative for chills and fever.  HENT: Negative for sore throat and tinnitus.   Eyes: Negative for blurred vision and redness.  Respiratory: Positive for cough, sputum production and shortness of breath.   Cardiovascular: Negative for chest pain, palpitations, orthopnea and PND.  Gastrointestinal: Negative for abdominal pain, diarrhea, nausea and vomiting.  Genitourinary: Negative for dysuria, frequency and urgency.  Musculoskeletal: Negative for joint pain and myalgias.  Skin: Negative for rash.       No lesions  Neurological: Negative for speech change, focal weakness and weakness.  Endo/Heme/Allergies: Does not bruise/bleed easily.       No temperature intolerance  Psychiatric/Behavioral: Negative for depression and suicidal ideas.    Blood pressure 114/87, pulse 98, temperature 98.6 F (37 C), temperature source Oral, resp. rate (!) 27, height _0  (1.651 m), weight 61.2 kg (135 lb), last menstrual period 03/10/2016, SpO2 93 %. Physical Exam  Vitals reviewed. Constitutional: She is oriented to person, place, and time. She appears well-developed and well-nourished. No distress.  HENT:  Head: Normocephalic and atraumatic.  Mouth/Throat: Oropharynx is clear and moist.  Eyes: Conjunctivae and EOM are normal. Pupils are equal, round, and reactive to light. No scleral icterus.  Neck: Normal range of motion. Neck supple. No JVD present. No tracheal deviation present. No thyromegaly present.  Cardiovascular: Normal rate, regular rhythm and normal heart sounds.  Exam reveals no gallop and no friction rub.   No murmur heard. Respiratory: Effort normal. Tachypnea noted. She has rhonchi in the left lower field.  GI: Soft. Bowel sounds are normal. She exhibits no  distension. There is no tenderness.  Genitourinary:  Genitourinary Comments: Deferred  Musculoskeletal: Normal range of motion. She exhibits no edema.  Lymphadenopathy:    She has no cervical adenopathy.  Neurological: She is alert and oriented to person, place, and time. No cranial nerve deficit. She exhibits normal muscle tone.  Skin: Skin is warm and dry. No rash noted. No erythema.  Psychiatric: She has a normal mood and affect. Her behavior is normal. Judgment and thought content normal.     Assessment/Plan This is a 40 year old female admitted for sepsis. 1. Sepsis: The patient meets criteria via intermittent tachycardia, tachypnea and leukocytosis. Likely source is pneumonia. She is hemodynamically  stable. Follow blood cultures for growth and sensitivities. 2. Pneumonia: Community-acquired; ceftriaxone and azithromycin on board. The patient is homeless and may be immunocompromised. Check HIV status. Supplemental oxygen as needed. 3. DVT prophylaxis: Lovenox 4. GI prophylaxis: None The patient is a full code. Time spent on admission orders and patient care approximately 45 minutes  Harrie Foreman, MD 03/23/2016, 7:32 AM

## 2016-03-23 NOTE — Plan of Care (Signed)
Problem: Physical Regulation: Goal: Signs and symptoms of infection will decrease Outcome: Progressing Pt admitted from the ED. Denies pain. VSS. Dyspnea with exertion. O2 sats 92%-93% on 2L O2 per [place].

## 2016-03-23 NOTE — ED Triage Notes (Signed)
Pt arrived via ems. Pt reports having a syncopal episode and her sister finding her on the ground. Pt reports having a cough and fever for 1 week. Pt alert and oriented upon arrival.  Pt very anxious and states feeling short of breath. Pt placed on 2L via nasal canula after an O2 reading of 90%.

## 2016-03-23 NOTE — ED Provider Notes (Signed)
[facility] Emergency Department Provider Note   ____________________________________________   First MD Initiated Contact with Patient 03/23/16 0206     (approximate)  I have reviewed the triage vital signs and the nursing notes.   HISTORY  Chief Complaint Shortness of Breath    HPI Rebecca [name] is a 41 y.o. female who comes into the hospital today with some chest pain, shortness of breath and heart racing. She reports that she's been having symptoms for the past couple of days. The patient has been sick since last week. She had temperatures to 103.6. She states she was given Tylenol and she was seen here and sent home. The symptoms went away but she reports that they came back. She has taken nothing for her chest pain. She has no insurance or she is unable to follow-up. The patient has had some nausea, vomiting and diarrhea. She reports her pain as a 10 out of 10 in intensity. It is at the mid chest. As to talk or cough her chest tightens up. The patient is here for evaluation.  History reviewed. No pertinent past medical history.  Patient Active Problem List   Diagnosis Date Noted  . Sepsis (HCC) 03/23/2016    History reviewed. No pertinent surgical history.  Prior to Admission medications   Not on File    Allergies Patient has no known allergies.  No family history on file.  Social History Social History  Substance Use Topics  . Smoking status: Current Every Day Smoker    Types: Cigarettes  . Smokeless tobacco: Never Used  . Alcohol use No    Review of Systems Constitutional:  fever/chills Eyes: No visual changes. ENT: No sore throat. Cardiovascular:  chest pain. Respiratory: cough, shortness of breath. Gastrointestinal: No abdominal pain.  No nausea, no vomiting.  No diarrhea.  No constipation. Genitourinary: Negative for dysuria. Musculoskeletal: Negative for back pain. Skin: Negative for rash. Neurological: Negative for  headaches, focal weakness or numbness.  10-point ROS otherwise negative.  ____________________________________________   PHYSICAL EXAM:  VITAL SIGNS: ED Triage Vitals  Enc Vitals Group     BP 03/23/16 0153 (!) 155/109     Pulse Rate 03/23/16 0153 (!) 107     Resp 03/23/16 0230 13     Temp 03/23/16 0153 100 F (37.8 C)     Temp Source 03/23/16 0153 Oral     SpO2 03/23/16 0153 91 %     Weight 03/23/16 0201 135 lb (61.2 kg)     Height 03/23/16 0201 5\' 5"  (1.651 m)     Head Circumference --      Peak Flow --      Pain Score 03/23/16 0202 10     Pain Loc --      Pain Edu? --      Excl. in GC? --     Constitutional: Alert and oriented. Well appearing and in no acute distress. Eyes: Conjunctivae are normal. PERRL. EOMI. Head: Atraumatic. Nose: No congestion/rhinnorhea. Mouth/Throat: Mucous membranes are moist.  Oropharynx non-erythematous. Cardiovascular: Normal rate, regular rhythm. Grossly normal heart sounds.  Good peripheral circulation. Respiratory: Normal respiratory effort.  No retractions. The patient has some expiratory wheezes in the left lobes. Gastrointestinal: Soft and nontender. No distention. Positive bowel sounds Musculoskeletal: No lower extremity tenderness nor edema.   Neurologic:  Normal speech and language.  Skin:  Skin is warm, dry and intact.  Psychiatric: Mood and affect are normal.   ____________________________________________   LABS (all labs  ordered are listed, but only abnormal results are displayed)  Labs Reviewed  CBC - Abnormal; Notable for the following:       Result Value   WBC 12.5 (*)    Platelets 525 (*)    All other components within normal limits  COMPREHENSIVE METABOLIC PANEL - Abnormal; Notable for the following:    Glucose, Bld 103 (*)    Total Protein 8.5 (*)    Albumin 2.9 (*)    All other components within normal limits  CULTURE, BLOOD (ROUTINE X 2)  CULTURE, BLOOD (ROUTINE X 2)  INFLUENZA PANEL BY PCR (TYPE A & B)    TROPONIN I  LACTIC ACID, PLASMA  LACTIC ACID, PLASMA   ____________________________________________  EKG  ED ECG REPORT I, Rebecka Apley, the attending physician, personally viewed and interpreted this ECG.   Date: 03/23/2016  EKG Time: 152  Rate: 105  Rhythm: sinus tachycardia  Axis: normal  Intervals:none  ST&T Change: none  ____________________________________________  RADIOLOGY  CXR ____________________________________________   PROCEDURES  Procedure(s) performed: None  Procedures  Critical Care performed: No  ____________________________________________   INITIAL IMPRESSION / ASSESSMENT AND PLAN / ED COURSE  Pertinent labs & imaging results that were available during my care of the patient were reviewed by me and considered in my medical decision making (see chart for details).  This is a 41 year old female who comes into the hospital today with cough, chest pain and feeling unwell. She reports that she is feeling short of breath as well. The patient does have some wheezing so I did give her a DuoNeb treatment. I ordered a chest x-ray which showed some patchy airspace disease in the right upper lobe. The patient did have some hypoxia so she was placed on O2. I'll give the patient some ceftriaxone and azithromycin for her pneumonia but she remained hypoxic after DuoNeb treatment. Her O2 sats were in the 89% range. The decision was made to admit the patient. She did have some mild tachycardia but that had improved. She does have some Sirs criteria but at this time does not meet sepsis criteria.  Clinical Course as of Mar 23 657  Wed Mar 23, 2016  0320 Patchy airspace disease in the right upper lobe consistent with pneumonia. Emphysematous hyperinflation of lungs.   DG Chest 2 View [AW]    Clinical Course User Index [AW] Rebecka Apley, MD     ____________________________________________   FINAL CLINICAL IMPRESSION(S) / ED DIAGNOSES  Final  diagnoses:  Community acquired pneumonia of right upper lobe of lung (HCC)  Hypoxia      NEW MEDICATIONS STARTED DURING THIS VISIT:  New Prescriptions   No medications on file     Note:  This document was prepared using Dragon voice recognition software and may include unintentional dictation errors.    Rebecka Apley, MD 03/23/16 [phone]

## 2016-03-23 NOTE — Care Management Note (Signed)
Case Management Note  Patient Details  Name: Rebecca [name] MRN: [id number] Date of Birth: [birth year]  Subjective/Objective:                  Met with patient to discuss discharge planning. Patient is homeless- CSW consult requested. She is using a friend/drug dealer's address (per patient) [address] [place] [address] and a friend named Dorita Sciara for phone [phone]. She states she still uses drugs from time to time. She has no transportation or PCP. She is not on any prescribed medications. She states she walks to wherever she goes and stays at random houses. 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 She pays 30$/month for cell phone on "prostitution money".  She has three daughter, one as young at 39, but all "were placed in foster care due to her drug addiction". Patient was not on O2 when I met with her.   Action/Plan: Referral to [facility] and Medication Management. This RNCM shared contact number for [facility] and register of deeds office as she states she was born in [place]- to assist with her birth certificate and to help her with a job/schooling. No further RNCM needs however if antibiotics are prescribed and patient discharges on Friday/weekend RNCM to follow for [facility] assistance. Patient is aware that we cannot assist her with narcotics.   Expected Discharge Date:                  Expected Discharge Plan:     In-House Referral:  Clinical Social Work  Discharge planning Services  CM Consult, Medication Assistance, [facility]  Post Acute Care Choice:    Choice offered to:  Patient  DME Arranged:    DME Agency:     HH Arranged:    [place] Agency:     Status of Service:  In process, will continue to follow  If discussed at Long Length of Stay Meetings, dates discussed:    Additional Comments:  Marshell Garfinkel, RN 03/23/2016, 1:40 PM

## 2016-03-24 LAB — HEMOGLOBIN A1C
Hgb A1c MFr Bld: 5.9 % — ABNORMAL HIGH (ref 4.8–5.6)
Mean Plasma Glucose: 123 mg/dL

## 2016-03-24 LAB — HIV ANTIBODY (ROUTINE TESTING W REFLEX): HIV Screen 4th Generation wRfx: NONREACTIVE

## 2016-03-24 MED ORDER — ALBUTEROL SULFATE HFA 108 (90 BASE) MCG/ACT IN AERS: 2.0000 | INHALATION_SPRAY | RESPIRATORY_TRACT | 0 refills | Status: DC | PRN

## 2016-03-24 MED ORDER — ALBUTEROL SULFATE (2.5 MG/3ML) 0.083% IN NEBU
3.0000 mL | INHALATION_SOLUTION | Freq: Four times a day (QID) | RESPIRATORY_TRACT | Status: DC
  Filled 2016-03-24 (×2): qty 3

## 2016-03-24 MED ORDER — LEVOFLOXACIN 750 MG PO TABS: 750.0000 mg | ORAL_TABLET | Freq: Every day | ORAL | 0 refills | Status: DC

## 2016-03-24 MED ORDER — PREDNISONE 20 MG PO TABS: 40.0000 mg | ORAL_TABLET | Freq: Every day | ORAL | 0 refills | Status: DC

## 2016-03-24 NOTE — Plan of Care (Signed)
Problem: Activity: Goal: Ability to tolerate increased activity will improve Outcome: Progressing Ambulated with 2L oxygen around nurses station x 2 without difficulty

## 2016-03-24 NOTE — Discharge Instructions (Signed)
QUIT SMOKING ° °Resume diet and activity as before °

## 2016-03-25 NOTE — Discharge Summary (Signed)
[facility] [place] [facility]   PATIENT NAME: Rebecca [name]    MR#:  [id number]  DATE OF BIRTH:  Jul 10, 1975  DATE OF ADMISSION:  03/23/2016 ADMITTING PHYSICIAN: Arnaldo Natal, MD  DATE OF DISCHARGE: 03/24/2016  2:39 PM  PRIMARY CARE PHYSICIAN: Pcp Not In System   ADMISSION DIAGNOSIS:  Hypoxia [R09.02] Community acquired pneumonia of right upper lobe of lung (HCC) [J18.1]  DISCHARGE DIAGNOSIS:  Active Problems:   Sepsis (HCC)   SECONDARY DIAGNOSIS:  History reviewed. No pertinent past medical history.   ADMITTING HISTORY  Chief Complaint: Shortness of breath HPI: The patient with no chronic medical illnesses presents emergency department complaining of shortness of breath. She states that for the last few days she has been unable to walk without stopping every 5 minutes due to dyspnea. She also thinks that she may have fainted at some point. The patient was treated for the flu last week but since that time has been coughing and bringing up thick white to yellow phlegm. In the emergency department the patient was found to be very tachypneic and rhonchorous. Chest x-ray confirmed bilateral patchy infiltrates as well as right lower lobe consolidation consistent with pneumonia. Oxygen saturations were 89% on room air with further desaturation with ambulation. Thus the emergency department staff called the hospitalist service for admission.  HOSPITAL COURSE:   * sepsis present on admission * community pneumonia * acute hypoxic respiratory failure * COPD exacerbation  Patient was treated with IV steroids, nebulizers and IV antibiotics. Oxygen was slowly weaned. By day of discharge patient is on waiting in the hallway on room air with saturations more than 95%. Wheezing has resolved. She feels close to normal other than having a cough. Patient is being discharged home on oral prednisone, Levaquin and Habitrol inhaler. Counseled to quit smoking.  Stable for  discharge home.  CONSULTS OBTAINED:    DRUG ALLERGIES:  No Known Allergies  DISCHARGE MEDICATIONS:   Discharge Medication List as of 03/24/2016 12:41 PM    START taking these medications   Details  albuterol (PROVENTIL HFA;VENTOLIN HFA) 108 (90 Base) MCG/ACT inhaler Inhale 2 puffs into the lungs every 4 (four) hours as needed for wheezing or shortness of breath., Starting Thu 03/24/2016, Print    levofloxacin (LEVAQUIN) 750 MG tablet Take 1 tablet (750 mg total) by mouth daily., Starting Thu 03/24/2016, Print    predniSONE (DELTASONE) 20 MG tablet Take 2 tablets (40 mg total) by mouth daily with breakfast., Starting Thu 03/24/2016, Print        Today   VITAL SIGNS:  Blood pressure (!) 145/95, pulse (!) 101, temperature 98.7 F (37.1 C), temperature source Oral, resp. rate 16, height 5\' 5"  (1.651 m), weight 60.8 kg (134 lb 1.6 oz), last menstrual period 03/10/2016, SpO2 95 %.  I/O:  No intake or output data in the 24 hours ending 03/25/16 1418  PHYSICAL EXAMINATION:  Physical Exam  GENERAL:  41 y.o.-year-old patient lying in the bed with no acute distress.  LUNGS: Normal breath sounds bilaterally, no wheezing, rales,rhonchi or crepitation. No use of accessory muscles of respiration.  CARDIOVASCULAR: S1, S2 normal. No murmurs, rubs, or gallops.  ABDOMEN: Soft, non-tender, non-distended. Bowel sounds present. No organomegaly or mass.  NEUROLOGIC: Moves all 4 extremities. PSYCHIATRIC: The patient is alert and oriented x 3.  SKIN: No obvious rash, lesion, or ulcer.   DATA REVIEW:   CBC  Recent Labs Lab 03/23/16 0209  WBC 12.5*  HGB 12.0  HCT  35.6  PLT 525*    Chemistries   Recent Labs Lab 03/23/16 0209  NA 138  K 3.9  CL 102  CO2 27  GLUCOSE 103*  BUN 11  CREATININE 0.91  CALCIUM 8.9  AST 23  ALT 16  ALKPHOS 54  BILITOT 0.7    Cardiac Enzymes  Recent Labs Lab 03/23/16 0209  TROPONINI <0.03    Microbiology Results  Results for orders placed or  performed during the hospital encounter of 03/23/16  Blood culture (routine x 2)     Status: None (Preliminary result)   Collection Time: 03/23/16  3:53 AM  Result Value Ref Range Status   Specimen Description BLOOD LEFT ANTECUBITAL  Final   Special Requests BOTTLES DRAWN AEROBIC AND ANAEROBIC  BCHV  Final   Culture NO GROWTH 2 DAYS  Final   Report Status PENDING  Incomplete  Blood culture (routine x 2)     Status: None (Preliminary result)   Collection Time: 03/23/16  3:53 AM  Result Value Ref Range Status   Specimen Description BLOOD RIGHT HAND  Final   Special Requests BOTTLES DRAWN AEROBIC AND ANAEROBIC  BCAV  Final   Culture NO GROWTH 2 DAYS  Final   Report Status PENDING  Incomplete    RADIOLOGY:  No results found.  Follow up with PCP in 1 week.  Management plans discussed with the patient, family and they are in agreement.  CODE STATUS:  Code Status History    Date Active Date Inactive Code Status Order ID Comments User Context   03/23/2016 10:14 AM 03/24/2016  5:46 PM Full Code [id number]  Arnaldo NatalMichael S Diamond, MD Inpatient      TOTAL TIME TAKING CARE OF THIS PATIENT ON DAY OF DISCHARGE: more than 30 minutes.   Milagros LollSudini, Milfred Krammes R M.D on 03/25/2016 at 2:18 PM  Between 7am to 6pm - Pager - [phone]  After 6pm go to [email or web address] - password [email or web address]  [facility] [place] [facility]  Office  [phone]  CC: Primary care physician; Pcp Not In System  Note: This dictation was prepared with Dragon dictation along with smaller phrase technology. Any transcriptional errors that result from this process are unintentional.

## 2016-03-28 LAB — CULTURE, BLOOD (ROUTINE X 2)
CULTURE: NO GROWTH
CULTURE: NO GROWTH

## 2017-05-12 ENCOUNTER — Emergency Department
Admission: EM | Admit: 2017-05-12 | Discharge: 2017-05-13 | Disposition: A | Discharge status: Other Acute Inpatient Hospital | Payer: Self-pay | Attending: Emergency Medicine | Admitting: Emergency Medicine

## 2017-05-12 ENCOUNTER — Encounter: Payer: Self-pay | Admitting: Emergency Medicine

## 2017-05-12 DIAGNOSIS — F1910 Other psychoactive substance abuse, uncomplicated: Secondary | ICD-10-CM

## 2017-05-12 DIAGNOSIS — F1994 Other psychoactive substance use, unspecified with psychoactive substance-induced mood disorder: Secondary | ICD-10-CM | POA: Insufficient documentation

## 2017-05-12 DIAGNOSIS — F1410 Cocaine abuse, uncomplicated: Secondary | ICD-10-CM | POA: Insufficient documentation

## 2017-05-12 DIAGNOSIS — F17210 Nicotine dependence, cigarettes, uncomplicated: Secondary | ICD-10-CM | POA: Insufficient documentation

## 2017-05-12 DIAGNOSIS — F329 Major depressive disorder, single episode, unspecified: Secondary | ICD-10-CM | POA: Insufficient documentation

## 2017-05-12 DIAGNOSIS — R45851 Suicidal ideations: Secondary | ICD-10-CM

## 2017-05-12 LAB — COMPREHENSIVE METABOLIC PANEL
ALK PHOS: 94 U/L (ref 38–126)
ALT: 15 U/L (ref 14–54)
AST: 22 U/L (ref 15–41)
Albumin: 3.8 g/dL (ref 3.5–5.0)
Anion gap: 8 (ref 5–15)
BUN: 15 mg/dL (ref 6–20)
CHLORIDE: 107 mmol/L (ref 101–111)
CO2: 25 mmol/L (ref 22–32)
Calcium: 9.2 mg/dL (ref 8.9–10.3)
Creatinine, Ser: 0.87 mg/dL (ref 0.44–1.00)
GFR calc non Af Amer: >60 mL/min (ref >60)
GLUCOSE: 94 mg/dL (ref 65–99)
Potassium: 3.8 mmol/L (ref 3.5–5.1)
SODIUM: 140 mmol/L (ref 135–145)
Total Bilirubin: 0.5 mg/dL (ref 0.3–1.2)
Total Protein: 7.5 g/dL (ref 6.5–8.1)

## 2017-05-12 LAB — URINE DRUG SCREEN, QUALITATIVE (ARMC ONLY)
AMPHETAMINES, UR SCREEN: NOT DETECTED
BENZODIAZEPINE, UR SCRN: NOT DETECTED
Barbiturates, Ur Screen: NOT DETECTED
Cannabinoid 50 Ng, Ur ~~LOC~~: NOT DETECTED
Cocaine Metabolite,Ur ~~LOC~~: POSITIVE — AB
MDMA (Ecstasy)Ur Screen: NOT DETECTED
METHADONE SCREEN, URINE: NOT DETECTED
Opiate, Ur Screen: NOT DETECTED
Phencyclidine (PCP) Ur S: NOT DETECTED
Tricyclic, Ur Screen: NOT DETECTED

## 2017-05-12 LAB — CBC
HCT: 41.5 % (ref 35.0–47.0)
Hemoglobin: 13.7 g/dL (ref 12.0–16.0)
MCH: 29.5 pg (ref 26.0–34.0)
MCHC: 33.1 g/dL (ref 32.0–36.0)
MCV: 89.3 fL (ref 80.0–100.0)
PLATELETS: 344 10*3/uL (ref 150–440)
RBC: 4.65 MIL/uL (ref 3.80–5.20)
RDW: 15.5 % — AB (ref 11.5–14.5)
WBC: 4.9 10*3/uL (ref 3.6–11.0)

## 2017-05-12 LAB — SALICYLATE LEVEL

## 2017-05-12 LAB — ETHANOL

## 2017-05-12 LAB — ACETAMINOPHEN LEVEL

## 2017-05-12 LAB — POCT PREGNANCY, URINE: PREG TEST UR: NEGATIVE

## 2017-05-12 MED ORDER — NICOTINE 21 MG/24HR TD PT24
21.0000 mg | MEDICATED_PATCH | Freq: Once | TRANSDERMAL | Status: DC
Start: 2017-05-12 — End: 2017-05-13
  Administered 2017-05-12: 21 mg via TRANSDERMAL
  Filled 2017-05-12: qty 1

## 2017-05-12 MED ORDER — IBUPROFEN 600 MG PO TABS
600.0000 mg | ORAL_TABLET | Freq: Once | ORAL | Status: AC
  Administered 2017-05-12: 600 mg via ORAL
  Filled 2017-05-12: qty 1

## 2017-05-12 NOTE — ED Notes (Signed)
IVC PAPERS INITIATED BY MD SCHAEVITZ/RN AMY T. AND ODS SECURITY MADE AWARE.

## 2017-05-12 NOTE — ED Notes (Signed)

## 2017-05-12 NOTE — ED Notes (Signed)

## 2017-05-12 NOTE — ED Notes (Signed)
MD Clapacs is currently at bedside

## 2017-05-12 NOTE — ED Notes (Signed)
BEHAVIORAL HEALTH ROUNDING Patient sleeping: No. Patient alert and oriented: yes Behavior appropriate: Yes.  ; If no, describe:  Nutrition and fluids offered: yes Toileting and hygiene offered: Yes  Sitter present: q15 minute observations and security monitoring Law enforcement present: Yes    
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
## 2017-05-12 NOTE — ED Triage Notes (Signed)
Patient presents to the ED with suicidal ideation x 1 week.  Patient is tearful in triage.  Patient reports her mother passed away 2 years ago and she feels alone.  Patient reports she is addicted to cocaine.  Patient denies any physical pain or issues.

## 2017-05-12 NOTE — ED Notes (Signed)
Pt. Moved in BHU #5.  Pt. Given tour of unit and advised of security cameras and 15 min safety checks.

## 2017-05-12 NOTE — BH Assessment (Addendum)
Pt info faxed to [facility] for detox services referral along with signed consent. [phone]

## 2017-05-12 NOTE — Consult Note (Signed)
[facility] Consult   Reason for Consult: Consult for 42 year old woman who comes to the emergency room woman comes to the emergency room with concerns about substance abuse Referring Physician: Clearnce Hasten Patient Identification: Rebecca [name] MRN:  [id number] Principal Diagnosis: Cocaine abuse [facility]) Diagnosis:   Patient Active Problem List   Diagnosis Date Noted  . Cocaine abuse ([place]) [F14.10] 05/12/2017  . Substance induced mood disorder ([place]) [F19.94] 05/12/2017  . Sepsis ([place]) [A41.9] 03/23/2016    Total Time spent with patient: 1 hour  Subjective:   Rebecca [name] is a 42 y.o. female patient admitted with "I had suicidal thoughts and I really want to stop using cocaine".  HPI: Patient seen chart reviewed.  43 year old woman presents to the emergency room initially saying she was having suicidal ideation.  On interview I found the patient smiling upbeat and denies that she actually wants to die.  She says that she said that she had suicidal ideation because she is tired of being out in the streets using drugs and wants to get some help.  She says she is tired of using crack cocaine all the time.  Says she uses crack cocaine daily.  Longest sobriety in the past was only when she was incarcerated.  Denies that she is using any other drugs regularly.  Patient says her mood is irritable and down and negative much of the time.  Sleep is irregular.  Appetite irregular.  No report of any psychotic symptoms.  Not receiving any kind of mental health treatment.  Social history: Patient lives out on the streets going from place to place because of her drug addiction.  Her mother died a couple years ago leaving her with no family that she feels she trusts.  Patient has 3 children 2 of them adults the other one not in her custody.  Medical history: Has had sore throats and pneumonia in the past no ongoing medical issues.  Substance abuse history: Cocaine addiction for years.   Never been engage seriously in any treatment.  Never made any serious attempt to stop using.  Little or no familiarity with modalities of treatment.  Denies other drug abuse    Past Psychiatric History: No history of psychiatric treatment or medication.  She says a couple days ago she took a handful of pills for no really logical reason.  She says she thinks it was because she wanted to get help although she did not actually tell anybody about it at the time.  Denies having actually made any effort to try to kill herself denies violence.  Risk to Self: Is patient at risk for suicide?: Yes Risk to Others:   Prior Inpatient Therapy:   Prior Outpatient Therapy:    Past Medical History: History reviewed. No pertinent past medical history.  Past Surgical History:  Procedure Laterality Date  . CESAREAN SECTION     Family History:  Family History  Problem Relation Age of Onset  . Cancer Mother    Family Psychiatric  History: None known Social History:  Social History   Substance and Sexual Activity  Alcohol Use Yes   Comment: Bottle of wine/week     Social History   Substance and Sexual Activity  Drug Use Yes  . Frequency: 3.0 times per week  . Types: Cocaine, "Crack" cocaine    Social History   Socioeconomic History  . Marital status: Single    Spouse name: Not on file  . Number of children: Not on file  .  Years of education: Not on file  . Highest education level: Not on file  Occupational History  . Not on file  Social Needs  . Financial resource strain: Not on file  . Food insecurity:    Worry: Not on file    Inability: Not on file  . Transportation needs:    Medical: Not on file    Non-medical: Not on file  Tobacco Use  . Smoking status: Current Every Day Smoker    Packs/day: 0.50    Years: 15.00    Pack years: 7.50    Types: Cigarettes  . Smokeless tobacco: Never Used  Substance and Sexual Activity  . Alcohol use: Yes    Comment: Bottle of wine/week  .  Drug use: Yes    Frequency: 3.0 times per week    Types: Cocaine, "Crack" cocaine  . Sexual activity: Not on file  Lifestyle  . Physical activity:    Days per week: Not on file    Minutes per session: Not on file  . Stress: Not on file  Relationships  . Social connections:    Talks on phone: Not on file    Gets together: Not on file    Attends religious service: Not on file    Active member of club or organization: Not on file    Attends meetings of clubs or organizations: Not on file    Relationship status: Not on file  Other Topics Concern  . Not on file  Social History Narrative  . Not on file   Additional Social History:    Allergies:  No Known Allergies  Labs:  Results for orders placed or performed during the hospital encounter of 05/12/17 (from the past 48 hour(s))  Comprehensive metabolic panel     Status: None   Collection Time: 05/12/17  3:15 PM  Result Value Ref Range   Sodium 140 135 - 145 mmol/L   Potassium 3.8 3.5 - 5.1 mmol/L   Chloride 107 101 - 111 mmol/L   CO2 25 22 - 32 mmol/L   Glucose, Bld 94 65 - 99 mg/dL   BUN 15 6 - 20 mg/dL   Creatinine, Ser 0.87 0.44 - 1.00 mg/dL   Calcium 9.2 8.9 - 10.3 mg/dL   Total Protein 7.5 6.5 - 8.1 g/dL   Albumin 3.8 3.5 - 5.0 g/dL   AST 22 15 - 41 U/L   ALT 15 14 - 54 U/L   Alkaline Phosphatase 94 38 - 126 U/L   Total Bilirubin 0.5 0.3 - 1.2 mg/dL   GFR calc non Af Amer >60 >60 mL/min   GFR calc Af Amer >60 >60 mL/min    Comment: (NOTE) The eGFR has been calculated using the CKD EPI equation. This calculation has not been validated in all clinical situations. eGFR's persistently <60 mL/min signify possible Chronic Kidney Disease.    Anion gap 8 5 - 15    Comment: Performed at [facility], [address] [place], [place] [address]  Ethanol     Status: None   Collection Time: 05/12/17  3:15 PM  Result Value Ref Range   Alcohol, Ethyl (B) <10 <10 mg/dL    Comment:        LOWEST DETECTABLE LIMIT  FOR SERUM ALCOHOL IS 10 mg/dL FOR MEDICAL PURPOSES ONLY Performed at [facility], [address] [place], [place] [address]   Salicylate level     Status: None   Collection Time: 05/12/17  3:15 PM  Result Value Ref Range   Salicylate Lvl <0.7 2.8 - 30.0 mg/dL    Comment: Performed at [facility], [address] [place], [place] [address]  Acetaminophen level     Status: Abnormal   Collection Time: 05/12/17  3:15 PM  Result Value Ref Range   Acetaminophen (Tylenol), Serum <10 (L) 10 - 30 ug/mL    Comment:        THERAPEUTIC CONCENTRATIONS VARY SIGNIFICANTLY. A RANGE OF 10-30 ug/mL MAY BE AN EFFECTIVE CONCENTRATION FOR MANY PATIENTS. HOWEVER, SOME ARE BEST TREATED AT CONCENTRATIONS OUTSIDE THIS RANGE. ACETAMINOPHEN CONCENTRATIONS >150 ug/mL AT 4 HOURS AFTER INGESTION AND >50 ug/mL AT 12 HOURS AFTER INGESTION ARE OFTEN ASSOCIATED WITH TOXIC REACTIONS. Performed at [facility], [address] [place], [place] [address]   cbc     Status: Abnormal   Collection Time: 05/12/17  3:15 PM  Result Value Ref Range   WBC 4.9 3.6 - 11.0 K/uL   RBC 4.65 3.80 - 5.20 MIL/uL   Hemoglobin 13.7 12.0 - 16.0 g/dL   HCT 41.5 35.0 - 47.0 %   MCV 89.3 80.0 - 100.0 fL   MCH 29.5 26.0 - 34.0 pg   MCHC 33.1 32.0 - 36.0 g/dL   RDW 15.5 (H) 11.5 - 14.5 %   Platelets 344 150 - 440 K/uL    Comment: Performed at [facility], [address], [place], [place] [address]  Urine Drug Screen, Qualitative     Status: Abnormal   Collection Time: 05/12/17  6:06 PM  Result Value Ref Range   Tricyclic, Ur Screen NONE DETECTED NONE DETECTED   Amphetamines, Ur Screen NONE DETECTED NONE DETECTED   MDMA (Ecstasy)Ur Screen NONE DETECTED NONE DETECTED   Cocaine Metabolite,Ur [place] POSITIVE (A) NONE DETECTED   Opiate, Ur Screen NONE DETECTED NONE DETECTED   Phencyclidine (PCP) Ur S NONE DETECTED NONE DETECTED   Cannabinoid 50 Ng, Ur [place] NONE DETECTED NONE DETECTED    Barbiturates, Ur Screen NONE DETECTED NONE DETECTED   Benzodiazepine, Ur Scrn NONE DETECTED NONE DETECTED   Methadone Scn, Ur NONE DETECTED NONE DETECTED    Comment: (NOTE) Tricyclics + metabolites, urine    Cutoff 1000 ng/mL Amphetamines + metabolites, urine  Cutoff 1000 ng/mL MDMA (Ecstasy), urine              Cutoff 500 ng/mL Cocaine Metabolite, urine          Cutoff 300 ng/mL Opiate + metabolites, urine        Cutoff 300 ng/mL Phencyclidine (PCP), urine         Cutoff 25 ng/mL Cannabinoid, urine                 Cutoff 50 ng/mL Barbiturates + metabolites, urine  Cutoff 200 ng/mL Benzodiazepine, urine              Cutoff 200 ng/mL Methadone, urine                   Cutoff 300 ng/mL The urine drug screen provides only a preliminary, unconfirmed analytical test result and should not be used for non-medical purposes. Clinical consideration and professional judgment should be applied to any positive drug screen result due to possible interfering substances. A more specific alternate chemical method must be used in order to obtain a confirmed analytical result. Gas chromatography / mass spectrometry (GC/MS) is the preferred confirmat ory method. Performed at [facility], [address], [place], [place] [address]  Pregnancy, urine POC     Status: None   Collection Time: 05/12/17  6:13 PM  Result Value Ref Range   Preg Test, Ur NEGATIVE NEGATIVE    Comment:        THE SENSITIVITY OF THIS METHODOLOGY IS >24 mIU/mL     Current Facility-Administered Medications  Medication Dose Route Frequency Provider Last Rate Last Dose  . nicotine (NICODERM CQ - dosed in mg/24 hours) patch 21 mg  21 mg Transdermal Once Orbie Pyo, MD   21 mg at 05/12/17 1722   Current Outpatient Medications  Medication Sig Dispense Refill  . albuterol (PROVENTIL HFA;VENTOLIN HFA) 108 (90 Base) MCG/ACT inhaler Inhale 2 puffs into the lungs every 4 (four) hours as needed for wheezing or  shortness of breath. 1 Inhaler 0  . levofloxacin (LEVAQUIN) 750 MG tablet Take 1 tablet (750 mg total) by mouth daily. 5 tablet 0  . predniSONE (DELTASONE) 20 MG tablet Take 2 tablets (40 mg total) by mouth daily with breakfast. 8 tablet 0    Musculoskeletal: Strength & Muscle Tone: within normal limits Gait & Station: normal Patient leans: N/A  Psychiatric Specialty Exam: Physical Exam  Nursing note and vitals reviewed. Constitutional: She appears well-developed and well-nourished.  HENT:  Head: Normocephalic and atraumatic.  Eyes: Pupils are equal, round, and reactive to light. Conjunctivae are normal.  Neck: Normal range of motion.  Cardiovascular: Normal heart sounds.  Respiratory: Effort normal.  GI: Soft.  Musculoskeletal: Normal range of motion.  Neurological: She is alert.  Skin: Skin is warm and dry.  Psychiatric: Her speech is normal and behavior is normal. Thought content normal. Cognition and memory are normal. She expresses impulsivity.    Review of Systems  Constitutional: Negative.   HENT: Negative.   Eyes: Negative.   Respiratory: Negative.   Cardiovascular: Negative.   Gastrointestinal: Negative.   Musculoskeletal: Negative.   Skin: Negative.   Neurological: Negative.   Psychiatric/Behavioral: Positive for substance abuse. Negative for depression, hallucinations, memory loss and suicidal ideas. The patient has insomnia. The patient is not nervous/anxious.     Blood pressure (!) 190/111, pulse 84, temperature 98.9 F (37.2 C), resp. rate 16, height 5' 5"  (1.651 m), weight 61.2 kg (135 lb), last menstrual period 04/11/2017, SpO2 98 %.Body mass index is 22.47 kg/m.  General Appearance: Disheveled  Eye Contact:  Fair  Speech:  Clear and Coherent  Volume:  Decreased  Mood:  Euthymic  Affect:  Congruent  Thought Process:  Goal Directed  Orientation:  Full (Time, Place, and Person)  Thought Content:  Logical  Suicidal Thoughts:  No  Homicidal Thoughts:  No   Memory:  Immediate;   Fair Recent;   Fair Remote;   Fair  Judgement:  Impaired  Insight:  Shallow  Psychomotor Activity:  Decreased  Concentration:  Concentration: Fair  Recall:  [employer] of Knowledge:  Fair  Language:  Fair  Akathisia:  No  Handed:  Right  AIMS (if indicated):     Assets:  Desire for Improvement  ADL's:  Impaired  Cognition:  WNL  Sleep:        Treatment Plan Summary: Plan 42 year old woman who came to the hospital initially saying she was having suicidal ideation but during my interview it became clear to me that she did not actually want to die and was not seriously thinking of killing herself.  Instead she makes it very clear that what she wants his substance abuse treatment.  Patient has limited  experience or knowledge of appropriate substance abuse treatment.  Not currently psychotic.  Does not meet commitment criteria.  I have discontinued the IV C.  Case reviewed with emergency room physician and TTS and with the patient.  We can look into whether there is any kind of appropriate substance abuse referral that can be made at this time but she does not require inpatient psychiatric admission or specific medicine.  Disposition: No evidence of imminent risk to self or others at present.   Patient does not meet criteria for psychiatric inpatient admission. Supportive therapy provided about ongoing stressors.  Alethia Berthold, MD 05/12/2017 7:07 PM

## 2017-05-12 NOTE — ED Provider Notes (Addendum)
[facility] Emergency Department Provider Note  ____________________________________________   First MD Initiated Contact with Patient 05/12/17 1512     (approximate)  I have reviewed the triage vital signs and the nursing notes.   HISTORY  Chief Complaint Suicidal   HPI Rebecca [name] is a 42 y.o. female who is presenting to the emergency department today with suicidal ideation.  Says that she is still struggling with the death of her mother, who died 2 years ago.  Says that 2 days ago she last used crack cocaine and became very depressed after she "ran out."  Says that she smokes the crack and does not use IV drugs.  She says that at that point she took multiple pills but she is unsure of which pills she took.  Says that she took the pills in an attempt to kill herself.  She has now come in for further evaluation.   History reviewed. No pertinent past medical history.  Patient Active Problem List   Diagnosis Date Noted  . Sepsis (H[facility]) 03/23/2016    Past Surgical History:  Procedure Laterality Date  . CESAREAN SECTION      Prior to Admission medications   Medication Sig Start Date End Date Taking? Authorizing Provider  albuterol (PROVENTIL HFA;VENTOLIN HFA) 108 (90 Base) MCG/ACT inhaler Inhale 2 puffs into the lungs every 4 (four) hours as needed for wheezing or shortness of breath. 03/24/16   Milagros Loll, MD  levofloxacin (LEVAQUIN) 750 MG tablet Take 1 tablet (750 mg total) by mouth daily. 03/24/16   Milagros Loll, MD  predniSONE (DELTASONE) 20 MG tablet Take 2 tablets (40 mg total) by mouth daily with breakfast. 03/24/16   Milagros Loll, MD    Allergies Patient has no known allergies.  Family History  Problem Relation Age of Onset  . Cancer Mother     Social History Social History   Tobacco Use  . Smoking status: Current Every Day Smoker    Packs/day: 0.50    Years: 15.00    Pack years: 7.50    Types: Cigarettes  . Smokeless  tobacco: Never Used  Substance Use Topics  . Alcohol use: Yes    Comment: Bottle of wine/week  . Drug use: Yes    Frequency: 3.0 times per week    Types: Cocaine, "Crack" cocaine    Review of Systems  Constitutional: No fever/chills Eyes: No visual changes. ENT: No sore throat. Cardiovascular: Denies chest pain. Respiratory: Denies shortness of breath. Gastrointestinal: No abdominal pain.  No nausea, no vomiting.  No diarrhea.  No constipation. Genitourinary: Negative for dysuria. Musculoskeletal: Negative for back pain. Skin: Negative for rash. Neurological: Negative for headaches, focal weakness or numbness.   ____________________________________________   PHYSICAL EXAM:  VITAL SIGNS: ED Triage Vitals  Enc Vitals Group     BP 05/12/17 1454 (!) 190/111     Pulse Rate 05/12/17 1454 84     Resp 05/12/17 1454 16     Temp 05/12/17 1454 98.9 F (37.2 C)     Temp src --      SpO2 05/12/17 1454 98 %     Weight 05/12/17 1455 135 lb (61.2 kg)     Height 05/12/17 1455 5\' 5"  (1.651 m)     Head Circumference --      Peak Flow --      Pain Score 05/12/17 1455 0     Pain Loc --      Pain Edu? --  Excl. in GC? --     Constitutional: Alert and oriented. Well appearing and in no acute distress. Eyes: Conjunctivae are normal.  Head: Atraumatic. Nose: No congestion/rhinnorhea. Mouth/Throat: Mucous membranes are moist.  Neck: No stridor.   Cardiovascular: Normal rate, regular rhythm. Grossly normal heart sounds. Respiratory: Normal respiratory effort.  No retractions. Lungs CTAB. Gastrointestinal: Soft and nontender. No distention. Musculoskeletal: No lower extremity tenderness nor edema.   Neurologic:  Normal speech and language. No gross focal neurologic deficits are appreciated. Skin:  Skin is warm, dry and intact. No rash noted. Psychiatric: Mood and affect are normal. Speech and behavior are normal.  ____________________________________________   LABS (all labs  ordered are listed, but only abnormal results are displayed)  Labs Reviewed  CBC - Abnormal; Notable for the following components:      Result Value   RDW 15.5 (*)    All other components within normal limits  COMPREHENSIVE METABOLIC PANEL  ETHANOL  SALICYLATE LEVEL  ACETAMINOPHEN LEVEL  URINE DRUG SCREEN, QUALITATIVE ([facility] ONLY)  POC URINE PREG, ED   ____________________________________________  EKG   ____________________________________________  RADIOLOGY   ____________________________________________   PROCEDURES  Procedure(s) performed:   Procedures  Critical Care performed:   ____________________________________________   INITIAL IMPRESSION / ASSESSMENT AND PLAN / ED COURSE  Pertinent labs & imaging results that were available during my care of the patient were reviewed by me and considered in my medical decision making (see chart for details).  DDX: Psychosis, schizophrenia, bipolar disorder, depression, adjustment disorder, polysubstance abuse, suicidal ideation, suicide attempt As part of my medical decision making, I reviewed the following data within the electronic MEDICAL RECORD NUMBER Notes from prior ED visits  Patient will be placed under IVC and will be evaluated by psychiatry.  Patient is understanding of this plan and willing to comply.  We will also trend the patient's blood pressure.  Patient does not of any history of hypertension. ____________________________________________   FINAL CLINICAL IMPRESSION(S) / ED DIAGNOSES  Suicide attempt.  Suicidal ideation.    NEW MEDICATIONS STARTED DURING THIS VISIT:  New Prescriptions   No medications on file     Note:  This document was prepared using Dragon voice recognition software and may include unintentional dictation errors.     Myrna BlazerSchaevitz, Mikyah Alamo Matthew, MD 05/12/17 [phone]   ED ECG REPORT I, Arelia Longestavid M Theta Leaf, the attending physician, personally viewed and interpreted this ECG.   Date:  05/12/2017  EKG Time: 1943  Rate: 87  Rhythm: normal sinus rhythm  Axis: normal  Intervals:none  ST&T Change: no st segment elevation or depression.  No abnormal t wave inversions.     Myrna BlazerSchaevitz, Enio Hornback Matthew, MD 05/22/17 [phone]

## 2017-05-12 NOTE — ED Notes (Signed)
BEHAVIORAL HEALTH ROUNDING  Patient sleeping: No.  Patient alert and oriented: yes  Behavior appropriate: Yes. ; If no, describe:  Nutrition and fluids offered: Yes  Toileting and hygiene offered: Yes  Sitter present: not applicable, Q 15 min safety rounds and observation.  Law enforcement present: Yes ODS  

## 2017-05-13 MED ORDER — HYDROCHLOROTHIAZIDE 12.5 MG PO CAPS
12.5000 mg | ORAL_CAPSULE | Freq: Every day | ORAL | Status: DC
  Administered 2017-05-13: 12.5 mg via ORAL
  Filled 2017-05-13 (×2): qty 1

## 2017-05-13 MED ORDER — HYDROCHLOROTHIAZIDE 12.5 MG PO CAPS: 12.5000 mg | ORAL_CAPSULE | Freq: Every day | ORAL | 1 refills | Status: DC

## 2017-05-13 MED ORDER — IBUPROFEN 600 MG PO TABS
600.0000 mg | ORAL_TABLET | Freq: Once | ORAL | Status: AC
  Administered 2017-05-13: 600 mg via ORAL
  Filled 2017-05-13: qty 1

## 2017-05-13 MED ORDER — IBUPROFEN 600 MG PO TABS
ORAL_TABLET | ORAL | Status: AC
  Administered 2017-05-13: 600 mg via ORAL
  Filled 2017-05-13: qty 1

## 2017-05-13 MED ORDER — IBUPROFEN 600 MG PO TABS: 600.0000 mg | ORAL_TABLET | Freq: Once | ORAL | Status: DC

## 2017-05-13 NOTE — ED Notes (Signed)
EDP notified of patient's elevated BP. Medication administered as ordered.  Pt reported a 6/10 for tooth pain. Medication for pain administered as ordered. Maintained on 15 minute checks and observation by security camera for safety.

## 2017-05-13 NOTE — ED Notes (Signed)
Pt. Stated "My tooth is hurting again".  Pt. Stated "Can I have some ibuprofen".  Dr. Manson PasseyBrown informed.

## 2017-05-13 NOTE — BH Assessment (Signed)
Patient has been accepted to [facility]  Patient assigned to their detox facility Accepting physician is Dr. Lahoma RockerSherman.  Was told by [facility] their was no need to call report.  Representative was Kayleen Memosarol W.   ER Staff is aware of it:  [facility], ER Sectary.  Amy B., Patient's Nurse  Address: [facility] [address] [place], [place] [id number]

## 2017-05-13 NOTE — ED Notes (Signed)
Patient discharged voluntarily to [facility]. Prescription given to transport service (for hypertension). All belongings sent with patient. BP elevated. EDP aware. Medication given prior to discharge.

## 2017-05-13 NOTE — BH Assessment (Signed)
Writer followed up with RTS. Learned that new admits will occur late Sunday or possibly Monday. Writer faxed referral information to [facility]) after learning of two bed openings [phone]). Consent signed and placed in ED chart

## 2017-05-13 NOTE — BH Assessment (Signed)
Writer followed up with referral to [facility] ([phone]). Per [facility], patient information is pending review and they had questions about the patient being homeless and her not having transportation back to [place]. Writer spoke with patient about her plans following discharge, in the event she is accepted to [facility]. Patient states, she is okay with not coming back to [facility], she "whether not come back here."  Writer called [facility] [phone]) and updated them what the patient stated. Per [facility], the doctor will review the referral and will call TTS with disposition.

## 2019-12-24 ENCOUNTER — Encounter: Payer: Self-pay | Admitting: Physician Assistant

## 2019-12-24 ENCOUNTER — Emergency Department
Admission: EM | Admit: 2019-12-24 | Discharge: 2019-12-24 | Disposition: A | Discharge status: Home / Self Care | Payer: Self-pay | Attending: Emergency Medicine | Admitting: Emergency Medicine

## 2019-12-24 ENCOUNTER — Emergency Department: Payer: Self-pay

## 2019-12-24 ENCOUNTER — Other Ambulatory Visit: Payer: Self-pay

## 2019-12-24 DIAGNOSIS — J069 Acute upper respiratory infection, unspecified: Secondary | ICD-10-CM | POA: Insufficient documentation

## 2019-12-24 DIAGNOSIS — Z20822 Contact with and (suspected) exposure to covid-19: Secondary | ICD-10-CM | POA: Insufficient documentation

## 2019-12-24 DIAGNOSIS — F17210 Nicotine dependence, cigarettes, uncomplicated: Secondary | ICD-10-CM | POA: Insufficient documentation

## 2019-12-24 LAB — CBC WITH DIFFERENTIAL/PLATELET
Abs Immature Granulocytes: 0.04 10*3/uL (ref 0.00–0.07)
Basophils Absolute: 0 10*3/uL (ref 0.0–0.1)
Basophils Relative: 0 %
Eosinophils Absolute: 0.1 10*3/uL (ref 0.0–0.5)
Eosinophils Relative: 1 %
HCT: 43 % (ref 36.0–46.0)
Hemoglobin: 14.4 g/dL (ref 12.0–15.0)
Immature Granulocytes: 0 %
Lymphocytes Relative: 15 %
Lymphs Abs: 1.6 10*3/uL (ref 0.7–4.0)
MCH: 30.7 pg (ref 26.0–34.0)
MCHC: 33.5 g/dL (ref 30.0–36.0)
MCV: 91.7 fL (ref 80.0–100.0)
Monocytes Absolute: 0.7 10*3/uL (ref 0.1–1.0)
Monocytes Relative: 6 %
Neutro Abs: 8.2 10*3/uL — ABNORMAL HIGH (ref 1.7–7.7)
Neutrophils Relative %: 78 %
Platelets: 335 10*3/uL (ref 150–400)
RBC: 4.69 MIL/uL (ref 3.87–5.11)
RDW: 13.2 % (ref 11.5–15.5)
WBC: 10.6 10*3/uL — ABNORMAL HIGH (ref 4.0–10.5)
nRBC: 0 % (ref 0.0–0.2)

## 2019-12-24 LAB — BASIC METABOLIC PANEL
Anion gap: 13 (ref 5–15)
BUN: 11 mg/dL (ref 6–20)
CO2: 24 mmol/L (ref 22–32)
Calcium: 9 mg/dL (ref 8.9–10.3)
Chloride: 102 mmol/L (ref 98–111)
Creatinine, Ser: 0.83 mg/dL (ref 0.44–1.00)
GFR, Estimated: >60 mL/min (ref >60)
Glucose, Bld: 131 mg/dL — ABNORMAL HIGH (ref 70–99)
Potassium: 2.9 mmol/L — ABNORMAL LOW (ref 3.5–5.1)
Sodium: 139 mmol/L (ref 135–145)

## 2019-12-24 LAB — RESPIRATORY PANEL BY RT PCR (FLU A&B, COVID)
Influenza A by PCR: NEGATIVE
Influenza B by PCR: NEGATIVE
SARS Coronavirus 2 by RT PCR: NEGATIVE

## 2019-12-24 MED ORDER — HYDROCHLOROTHIAZIDE 12.5 MG PO CAPS: 12.5000 mg | ORAL_CAPSULE | Freq: Every day | ORAL | 3 refills | Status: DC

## 2019-12-24 MED ORDER — PREDNISONE 20 MG PO TABS
60.0000 mg | ORAL_TABLET | Freq: Once | ORAL | Status: AC
  Administered 2019-12-24: 60 mg via ORAL
  Filled 2019-12-24: qty 3

## 2019-12-24 MED ORDER — ALBUTEROL SULFATE HFA 108 (90 BASE) MCG/ACT IN AERS: 2.0000 | INHALATION_SPRAY | RESPIRATORY_TRACT | 1 refills | Status: DC | PRN

## 2019-12-24 MED ORDER — ACETAMINOPHEN 325 MG PO TABS
650.0000 mg | ORAL_TABLET | Freq: Once | ORAL | Status: AC
  Administered 2019-12-24: 650 mg via ORAL
  Filled 2019-12-24: qty 2

## 2019-12-24 MED ORDER — BENZONATATE 100 MG PO CAPS
200.0000 mg | ORAL_CAPSULE | Freq: Once | ORAL | Status: AC
  Administered 2019-12-24: 200 mg via ORAL
  Filled 2019-12-24: qty 2

## 2019-12-24 MED ORDER — PREDNISONE 20 MG PO TABS: 40.0000 mg | ORAL_TABLET | Freq: Every day | ORAL | 0 refills | Status: AC

## 2019-12-24 MED ORDER — ALBUTEROL SULFATE HFA 108 (90 BASE) MCG/ACT IN AERS
2.0000 | INHALATION_SPRAY | Freq: Four times a day (QID) | RESPIRATORY_TRACT | Status: DC
  Administered 2019-12-24: 2 via RESPIRATORY_TRACT
  Filled 2019-12-24: qty 6.7

## 2019-12-24 MED ORDER — POTASSIUM CHLORIDE CRYS ER 20 MEQ PO TBCR
20.0000 meq | EXTENDED_RELEASE_TABLET | Freq: Once | ORAL | Status: AC
  Administered 2019-12-24: 20 meq via ORAL
  Filled 2019-12-24: qty 1

## 2019-12-24 NOTE — ED Triage Notes (Signed)
Pt reports that she started with a cough 2 days ago, productive and yellow in color. Pt reports receiving her J&J vaccine in June. Denies any known exposure. Pt reports last tested in Oct and was negative

## 2019-12-24 NOTE — ED Triage Notes (Signed)
Pt in via EMS from home with c/o a cough since last pm, 98% RA, HR 10, 98.3 temp, 148/94. Pt with hx of asthma

## 2019-12-24 NOTE — Discharge Instructions (Signed)
Your exam, labs, and CXR are normal at this time. Take the prescription meds as directed. Follow-up with a local community clinic for ongoing symptoms.

## 2019-12-24 NOTE — ED Provider Notes (Signed)
[facility] Emergency Department Provider Note ____________________________________________  Time seen: 1121  I have reviewed the triage vital signs and the nursing notes.  HISTORY  Chief Complaint  Cough  HPI Rebecca [name] is a 44 y.o. female presents to the ED of EMS from "home" with a 2-day complaint of productive cough. She reports being in transition after her recent release from prison. The patient received [employer] Covid vaccine in June. She denies any recent sick contacts, recent travel or other high risk Covid exposures. She tested for Covid in October, prior from release from prison, and that test was negative. She gives a history of asthma.  She denies any frank chest pain, abdominal pain, nausea vomiting, or dizziness patient also denies any taste/smell sensation.  History reviewed. No pertinent past medical history.  Patient Active Problem List   Diagnosis Date Noted  . Cocaine abuse (HCC) 05/12/2017  . Substance induced mood disorder (HCC) 05/12/2017  . Sepsis (HCC) 03/23/2016    Past Surgical History:  Procedure Laterality Date  . CESAREAN SECTION      Prior to Admission medications   Medication Sig Start Date End Date Taking? Authorizing Provider  albuterol (PROVENTIL HFA;VENTOLIN HFA) 108 (90 Base) MCG/ACT inhaler Inhale 2 puffs into the lungs every 4 (four) hours as needed for wheezing or shortness of breath. 03/24/16   Milagros Loll, MD  hydrochlorothiazide (MICROZIDE) 12.5 MG capsule Take 1 capsule (12.5 mg total) by mouth daily. 05/13/17   Nita Sickle, MD    Allergies Patient has no known allergies.  Family History  Problem Relation Age of Onset  . Cancer Mother     Social History Social History   Tobacco Use  . Smoking status: Current Every Day Smoker    Packs/day: 0.50    Years: 15.00    Pack years: 7.50    Types: Cigarettes  . Smokeless tobacco: Never Used  Substance Use Topics  . Alcohol use: Yes     Comment: Bottle of wine/week  . Drug use: Yes    Frequency: 3.0 times per week    Types: Cocaine, "Crack" cocaine    Review of Systems  Constitutional: Negative for fever. Eyes: Negative for visual changes. ENT: Negative for sore throat. Cardiovascular: Negative for chest pain. Respiratory: Negative for shortness of breath. Reports cough as above. Gastrointestinal: Negative for abdominal pain, vomiting and diarrhea. Genitourinary: Negative for dysuria. Musculoskeletal: Negative for back pain. Skin: Negative for rash. Neurological: Negative for headaches, focal weakness or numbness. ____________________________________________  PHYSICAL EXAM:  VITAL SIGNS: ED Triage Vitals  Enc Vitals Group     BP 12/24/19 1109 (!) 146/83     Pulse Rate 12/24/19 1109 (!) 103     Resp 12/24/19 1109 20     Temp 12/24/19 1109 99.5 F (37.5 C)     Temp Source 12/24/19 1109 Oral     SpO2 12/24/19 1109 97 %     Weight 12/24/19 1110 155 lb (70.3 kg)     Height 12/24/19 1110 5\' 5"  (1.651 m)     Head Circumference --      Peak Flow --      Pain Score 12/24/19 1113 10     Pain Loc --      Pain Edu? --      Excl. in GC? --     Constitutional: Alert and oriented. Well appearing and in no distress. Head: Normocephalic and atraumatic. Eyes: Conjunctivae are normal. Normal extraocular movements Cardiovascular: Normal rate, regular  rhythm. Normal distal pulses. Respiratory: Normal respiratory effort. No rales/rhonchi. Mild end-expiratory wheeze noted Gastrointestinal: Soft and nontender. No distention. Musculoskeletal: Nontender with normal range of motion in all extremities.  Neurologic:  Normal gait without ataxia. Normal speech and language. No gross focal neurologic deficits are appreciated. Skin:  Skin is warm, dry and intact. No rash noted. Psychiatric: Mood and affect are normal. Patient exhibits appropriate insight and judgment. ____________________________________________   LABS  (pertinent positives/negatives) Labs Reviewed  BASIC METABOLIC PANEL - Abnormal; Notable for the following components:      Result Value   Potassium 2.9 (*)    Glucose, Bld 131 (*)    All other components within normal limits  CBC WITH DIFFERENTIAL/PLATELET - Abnormal; Notable for the following components:   WBC 10.6 (*)    Neutro Abs 8.2 (*)    All other components within normal limits  RESPIRATORY PANEL BY RT PCR (FLU A&B, COVID)  ____________________________________________   RADIOLOGY  CXR  IMPRESSION: No active disease. ____________________________________________  PROCEDURES  Tylenol 650 mg PO Potassium CR 20 mEq PO Prednisone 60 mg PO Tessalon Perle 200 mg PO  Procedures ____________________________________________  INITIAL IMPRESSION / ASSESSMENT AND PLAN / ED COURSE  DDX: CAP, COVID, flu, viral URI  Patient with ED evaluation of a 2-day complaint of productive cough.  Patient clinically was stable without signs of tachycardia, fever, or respiratory distress.  There is also no indication of any acute dehydration or septic appearance. She was found to have a mild hypokalemia at 2.9, and was given PO potassium to correct K+.  Patient's x-rays negative for any acute intrathoracic findings.  She is reassured by her negative viral screen, and will be treated for viral etiology.  Social work consult was also placed for the patient, to offer resources in the community for primary care, medicine, and housing.  ----------------------------------------- 6:14 PM on 12/24/2019 ----------------------------------------- Patient's female companion, adenovirus of as patient's wife, presents to transfer the patient home after discharge. Social worker Restaurant manager, fast food) called back to relay that she had compiled a list of resources for the patient, she would email them to me so that I can print them and present them to the patient at discharge.  Rebecca [name] was evaluated in Emergency  Department on 12/24/2019 for the symptoms described in the history of present illness. She was evaluated in the context of the global COVID-19 pandemic, which necessitated consideration that the patient might be at risk for infection with the SARS-CoV-2 virus that causes COVID-19. Institutional protocols and algorithms that pertain to the evaluation of patients at risk for COVID-19 are in a state of rapid change based on information released by regulatory bodies including the CDC and federal and state organizations. These policies and algorithms were followed during the patient's care in the ED. ____________________________________________  FINAL CLINICAL IMPRESSION(S) / ED DIAGNOSES  Final diagnoses:  Viral URI with cough      Jadian Karman, Charlesetta Ivory, PA-C 12/24/19 1815    Dionne Bucy, MD 12/25/19 [phone]

## 2019-12-24 NOTE — Clinical Social Work Note (Signed)
Transition of Care [facility]) - Emergency Department Mini Assessment  Patient Details  Name: Rebecca [name] MRN: [id number] Date of Birth: 20-May-1975  Transition of Care [facility]) CM/SW Contact:    Villa Herb, LCSWA Phone Number: [phone] 12/24/2019, 6:25 PM  Clinical Narrative: TOC received consult for pt needing housing resources. CSW spoke with pt to confirm that pt was interested in going to a shelter, pt agreeable. CSW informed pt that will call back once finding more information about shelters. CSW called [employer], [employer] for the [employer], [employer], and the [employer]. CSW left multiple messages and called facilities multiple times. CSW was unable to get in touch with any facility due to it being after 5pm. CSW spoke with on call [facility] supervisor Dagoberto Reef who confirms that if pt is in need of placement TOC will have to follow in the morning due to it being after 5pm.   CSW spoke with pt regarding inability to find a shelter for tonight and if pt would be accepting of CSW getting transportation setup back to pts residence at [address] [place], [place], [address]. Pt states that she will be acceptable to getting transportation back to her residence. CSW to compile list of local resources for pt to have available at discharge.  CSW spoke with pts PA Emelda Brothers Menshew about having resources to provide to pt. Ms. Tomasa Blase asked that CSW send resource document over and she would print and provide to pt. CSW informed that pt does not need transportation and that pt has someone at ED to provide transportation to residence. TOC signing off.   ED Mini Assessment: What brought you to the Emergency Department? : Cough  Barriers to Discharge: Inadequate or no insurance, ED Barriers Resolved  Barrier interventions: Administrator, Civil Service provided  Means of departure: Car  Interventions which prevented an admission or readmission: Other (must enter  comment) Soil scientist resources provided)  Nurse, children's and Communications   Patient states their goals for this hospitalization and ongoing recovery are:: Return home   Choice offered to / list presented to : NA  Admission diagnosis:  cough ems Patient Active Problem List   Diagnosis Date Noted  . Cocaine abuse (HCC) 05/12/2017  . Substance induced mood disorder (HCC) 05/12/2017  . Sepsis (HCC) 03/23/2016   PCP:  Patient, No Pcp Per Pharmacy:  No Pharmacies Listed

## 2019-12-24 NOTE — ED Notes (Signed)
Social worker speaking to patient on phone.

## 2020-10-21 ENCOUNTER — Emergency Department: Payer: Self-pay

## 2020-10-21 ENCOUNTER — Encounter: Payer: Self-pay | Admitting: *Deleted

## 2020-10-21 ENCOUNTER — Other Ambulatory Visit: Payer: Self-pay

## 2020-10-21 DIAGNOSIS — Z5321 Procedure and treatment not carried out due to patient leaving prior to being seen by health care provider: Secondary | ICD-10-CM | POA: Insufficient documentation

## 2020-10-21 DIAGNOSIS — R21 Rash and other nonspecific skin eruption: Secondary | ICD-10-CM | POA: Insufficient documentation

## 2020-10-21 DIAGNOSIS — R059 Cough, unspecified: Secondary | ICD-10-CM | POA: Insufficient documentation

## 2020-10-21 LAB — BASIC METABOLIC PANEL
Anion gap: 7 (ref 5–15)
BUN: 18 mg/dL (ref 6–20)
CO2: 26 mmol/L (ref 22–32)
Calcium: 9.3 mg/dL (ref 8.9–10.3)
Chloride: 102 mmol/L (ref 98–111)
Creatinine, Ser: 0.87 mg/dL (ref 0.44–1.00)
GFR, Estimated: >60 mL/min (ref >60)
Glucose, Bld: 107 mg/dL — ABNORMAL HIGH (ref 70–99)
Potassium: 4.3 mmol/L (ref 3.5–5.1)
Sodium: 135 mmol/L (ref 135–145)

## 2020-10-21 LAB — CBC
HCT: 37.2 % (ref 36.0–46.0)
Hemoglobin: 12.4 g/dL (ref 12.0–15.0)
MCH: 27.9 pg (ref 26.0–34.0)
MCHC: 33.3 g/dL (ref 30.0–36.0)
MCV: 83.6 fL (ref 80.0–100.0)
Platelets: 346 10*3/uL (ref 150–400)
RBC: 4.45 MIL/uL (ref 3.87–5.11)
RDW: 15.9 % — ABNORMAL HIGH (ref 11.5–15.5)
WBC: 7.5 10*3/uL (ref 4.0–10.5)
nRBC: 0 % (ref 0.0–0.2)

## 2020-10-21 NOTE — ED Triage Notes (Signed)
Pt has a cough for 2 weeks.  States coughing up clear and green phlegm.  Pt also has a rash on both legs. No itching  rash began today.  Pt alert  speech clear

## 2020-10-22 ENCOUNTER — Emergency Department
Admission: EM | Admit: 2020-10-22 | Discharge: 2020-10-22 | Disposition: A | Discharge status: Home / Self Care | Payer: Self-pay | Attending: Emergency Medicine | Admitting: Emergency Medicine

## 2020-10-22 NOTE — ED Notes (Signed)
No answer when called several times from lobby 

## 2020-11-20 ENCOUNTER — Encounter (HOSPITAL_COMMUNITY): Payer: Self-pay | Admitting: Radiology

## 2022-06-17 ENCOUNTER — Inpatient Hospital Stay (HOSPITAL_COMMUNITY): Payer: Medicaid Other

## 2022-06-17 ENCOUNTER — Emergency Department: Payer: Medicaid Other

## 2022-06-17 ENCOUNTER — Inpatient Hospital Stay (HOSPITAL_COMMUNITY)
Admission: EM | Admit: 2022-06-17 | Discharge: 2022-07-09 | DRG: 004 | Disposition: E | Payer: Medicaid Other | Source: Other Acute Inpatient Hospital | Attending: Internal Medicine | Admitting: Internal Medicine

## 2022-06-17 ENCOUNTER — Encounter (HOSPITAL_COMMUNITY): Payer: Self-pay

## 2022-06-17 ENCOUNTER — Emergency Department
Admission: EM | Admit: 2022-06-17 | Discharge: 2022-06-17 | Disposition: A | Discharge status: Home / Self Care | Payer: Medicaid Other | Attending: Emergency Medicine | Admitting: Emergency Medicine

## 2022-06-17 DIAGNOSIS — N390 Urinary tract infection, site not specified: Secondary | ICD-10-CM | POA: Diagnosis present

## 2022-06-17 DIAGNOSIS — G08 Intracranial and intraspinal phlebitis and thrombophlebitis: Secondary | ICD-10-CM | POA: Diagnosis not present

## 2022-06-17 DIAGNOSIS — G935 Compression of brain: Secondary | ICD-10-CM | POA: Diagnosis present

## 2022-06-17 DIAGNOSIS — I614 Nontraumatic intracerebral hemorrhage in cerebellum: Secondary | ICD-10-CM | POA: Diagnosis not present

## 2022-06-17 DIAGNOSIS — I953 Hypotension of hemodialysis: Secondary | ICD-10-CM | POA: Diagnosis not present

## 2022-06-17 DIAGNOSIS — T405X1A Poisoning by cocaine, accidental (unintentional), initial encounter: Secondary | ICD-10-CM | POA: Diagnosis present

## 2022-06-17 DIAGNOSIS — S06895A Other specified intracranial injury with loss of consciousness greater than 24 hours with return to pre-existing conscious level, initial encounter: Secondary | ICD-10-CM | POA: Insufficient documentation

## 2022-06-17 DIAGNOSIS — S0990XA Unspecified injury of head, initial encounter: Secondary | ICD-10-CM | POA: Diagnosis present

## 2022-06-17 DIAGNOSIS — N179 Acute kidney failure, unspecified: Secondary | ICD-10-CM | POA: Diagnosis present

## 2022-06-17 DIAGNOSIS — Z9689 Presence of other specified functional implants: Secondary | ICD-10-CM | POA: Diagnosis not present

## 2022-06-17 DIAGNOSIS — S06316A Contusion and laceration of right cerebrum with loss of consciousness greater than 24 hours without return to pre-existing conscious level with patient surviving, initial encounter: Secondary | ICD-10-CM

## 2022-06-17 DIAGNOSIS — E872 Acidosis, unspecified: Secondary | ICD-10-CM | POA: Diagnosis present

## 2022-06-17 DIAGNOSIS — S0292XA Unspecified fracture of facial bones, initial encounter for closed fracture: Secondary | ICD-10-CM | POA: Insufficient documentation

## 2022-06-17 DIAGNOSIS — R569 Unspecified convulsions: Secondary | ICD-10-CM | POA: Diagnosis not present

## 2022-06-17 DIAGNOSIS — J9602 Acute respiratory failure with hypercapnia: Secondary | ICD-10-CM | POA: Diagnosis not present

## 2022-06-17 DIAGNOSIS — T502X5A Adverse effect of carbonic-anhydrase inhibitors, benzothiadiazides and other diuretics, initial encounter: Secondary | ICD-10-CM | POA: Diagnosis not present

## 2022-06-17 DIAGNOSIS — G8194 Hemiplegia, unspecified affecting left nondominant side: Secondary | ICD-10-CM | POA: Diagnosis present

## 2022-06-17 DIAGNOSIS — I63433 Cerebral infarction due to embolism of bilateral posterior cerebral arteries: Secondary | ICD-10-CM | POA: Diagnosis not present

## 2022-06-17 DIAGNOSIS — Z66 Do not resuscitate: Secondary | ICD-10-CM | POA: Diagnosis not present

## 2022-06-17 DIAGNOSIS — D6489 Other specified anemias: Secondary | ICD-10-CM | POA: Diagnosis present

## 2022-06-17 DIAGNOSIS — R29726 NIHSS score 26: Secondary | ICD-10-CM | POA: Diagnosis present

## 2022-06-17 DIAGNOSIS — T17998A Other foreign object in respiratory tract, part unspecified causing other injury, initial encounter: Secondary | ICD-10-CM | POA: Diagnosis not present

## 2022-06-17 DIAGNOSIS — Z8619 Personal history of other infectious and parasitic diseases: Secondary | ICD-10-CM

## 2022-06-17 DIAGNOSIS — J939 Pneumothorax, unspecified: Secondary | ICD-10-CM | POA: Diagnosis not present

## 2022-06-17 DIAGNOSIS — F1410 Cocaine abuse, uncomplicated: Secondary | ICD-10-CM | POA: Diagnosis present

## 2022-06-17 DIAGNOSIS — X58XXXA Exposure to other specified factors, initial encounter: Secondary | ICD-10-CM | POA: Diagnosis not present

## 2022-06-17 DIAGNOSIS — I82C11 Acute embolism and thrombosis of right internal jugular vein: Secondary | ICD-10-CM | POA: Diagnosis present

## 2022-06-17 DIAGNOSIS — I5032 Chronic diastolic (congestive) heart failure: Secondary | ICD-10-CM | POA: Diagnosis present

## 2022-06-17 DIAGNOSIS — G902 Horner's syndrome: Secondary | ICD-10-CM | POA: Diagnosis present

## 2022-06-17 DIAGNOSIS — J988 Other specified respiratory disorders: Secondary | ICD-10-CM

## 2022-06-17 DIAGNOSIS — I4719 Other supraventricular tachycardia: Secondary | ICD-10-CM | POA: Diagnosis not present

## 2022-06-17 DIAGNOSIS — I110 Hypertensive heart disease with heart failure: Secondary | ICD-10-CM | POA: Diagnosis present

## 2022-06-17 DIAGNOSIS — B954 Other streptococcus as the cause of diseases classified elsewhere: Secondary | ICD-10-CM | POA: Diagnosis present

## 2022-06-17 DIAGNOSIS — R402432 Glasgow coma scale score 3-8, at arrival to emergency department: Secondary | ICD-10-CM | POA: Diagnosis not present

## 2022-06-17 DIAGNOSIS — I161 Hypertensive emergency: Secondary | ICD-10-CM | POA: Diagnosis not present

## 2022-06-17 DIAGNOSIS — E861 Hypovolemia: Secondary | ICD-10-CM | POA: Diagnosis present

## 2022-06-17 DIAGNOSIS — Z515 Encounter for palliative care: Secondary | ICD-10-CM

## 2022-06-17 DIAGNOSIS — R0681 Apnea, not elsewhere classified: Secondary | ICD-10-CM | POA: Diagnosis not present

## 2022-06-17 DIAGNOSIS — J9601 Acute respiratory failure with hypoxia: Secondary | ICD-10-CM

## 2022-06-17 DIAGNOSIS — G936 Cerebral edema: Secondary | ICD-10-CM | POA: Diagnosis present

## 2022-06-17 DIAGNOSIS — E878 Other disorders of electrolyte and fluid balance, not elsewhere classified: Secondary | ICD-10-CM | POA: Diagnosis not present

## 2022-06-17 DIAGNOSIS — H5702 Anisocoria: Secondary | ICD-10-CM | POA: Diagnosis present

## 2022-06-17 DIAGNOSIS — I629 Nontraumatic intracranial hemorrhage, unspecified: Secondary | ICD-10-CM | POA: Diagnosis not present

## 2022-06-17 DIAGNOSIS — D509 Iron deficiency anemia, unspecified: Secondary | ICD-10-CM | POA: Diagnosis not present

## 2022-06-17 DIAGNOSIS — R4182 Altered mental status, unspecified: Secondary | ICD-10-CM | POA: Diagnosis present

## 2022-06-17 DIAGNOSIS — I158 Other secondary hypertension: Secondary | ICD-10-CM | POA: Diagnosis present

## 2022-06-17 DIAGNOSIS — E870 Hyperosmolality and hypernatremia: Secondary | ICD-10-CM | POA: Diagnosis not present

## 2022-06-17 DIAGNOSIS — F1490 Cocaine use, unspecified, uncomplicated: Secondary | ICD-10-CM

## 2022-06-17 DIAGNOSIS — R1310 Dysphagia, unspecified: Secondary | ICD-10-CM | POA: Diagnosis not present

## 2022-06-17 DIAGNOSIS — I2489 Other forms of acute ischemic heart disease: Secondary | ICD-10-CM | POA: Diagnosis not present

## 2022-06-17 DIAGNOSIS — I6389 Other cerebral infarction: Secondary | ICD-10-CM

## 2022-06-17 DIAGNOSIS — E860 Dehydration: Secondary | ICD-10-CM | POA: Diagnosis present

## 2022-06-17 DIAGNOSIS — S06366A Traumatic hemorrhage of cerebrum, unspecified, with loss of consciousness greater than 24 hours without return to pre-existing conscious level with patient surviving, initial encounter: Secondary | ICD-10-CM | POA: Diagnosis not present

## 2022-06-17 DIAGNOSIS — E871 Hypo-osmolality and hyponatremia: Secondary | ICD-10-CM | POA: Diagnosis not present

## 2022-06-17 DIAGNOSIS — B9562 Methicillin resistant Staphylococcus aureus infection as the cause of diseases classified elsewhere: Secondary | ICD-10-CM | POA: Diagnosis not present

## 2022-06-17 DIAGNOSIS — G9349 Other encephalopathy: Secondary | ICD-10-CM | POA: Diagnosis present

## 2022-06-17 DIAGNOSIS — I63411 Cerebral infarction due to embolism of right middle cerebral artery: Secondary | ICD-10-CM | POA: Diagnosis not present

## 2022-06-17 DIAGNOSIS — E876 Hypokalemia: Secondary | ICD-10-CM | POA: Diagnosis present

## 2022-06-17 DIAGNOSIS — G9340 Encephalopathy, unspecified: Secondary | ICD-10-CM | POA: Diagnosis not present

## 2022-06-17 DIAGNOSIS — J15212 Pneumonia due to Methicillin resistant Staphylococcus aureus: Secondary | ICD-10-CM | POA: Diagnosis not present

## 2022-06-17 DIAGNOSIS — Z79899 Other long term (current) drug therapy: Secondary | ICD-10-CM

## 2022-06-17 DIAGNOSIS — I610 Nontraumatic intracerebral hemorrhage in hemisphere, subcortical: Secondary | ICD-10-CM | POA: Diagnosis present

## 2022-06-17 DIAGNOSIS — I619 Nontraumatic intracerebral hemorrhage, unspecified: Principal | ICD-10-CM | POA: Diagnosis present

## 2022-06-17 DIAGNOSIS — J690 Pneumonitis due to inhalation of food and vomit: Secondary | ICD-10-CM | POA: Diagnosis not present

## 2022-06-17 DIAGNOSIS — Z7189 Other specified counseling: Secondary | ICD-10-CM | POA: Diagnosis not present

## 2022-06-17 DIAGNOSIS — F17210 Nicotine dependence, cigarettes, uncomplicated: Secondary | ICD-10-CM | POA: Diagnosis present

## 2022-06-17 DIAGNOSIS — R29723 NIHSS score 23: Secondary | ICD-10-CM | POA: Diagnosis not present

## 2022-06-17 LAB — PROTIME-INR
INR: 1.3 — ABNORMAL HIGH (ref 0.8–1.2)
Prothrombin Time: 16.6 seconds — ABNORMAL HIGH (ref 11.4–15.2)

## 2022-06-17 LAB — CBC WITH DIFFERENTIAL/PLATELET
Abs Immature Granulocytes: 0.3 10*3/uL — ABNORMAL HIGH (ref 0.00–0.07)
Basophils Absolute: 0.1 10*3/uL (ref 0.0–0.1)
Basophils Relative: 0 %
Eosinophils Absolute: 0 10*3/uL (ref 0.0–0.5)
Eosinophils Relative: 0 %
HCT: 43.6 % (ref 36.0–46.0)
Hemoglobin: 13.3 g/dL (ref 12.0–15.0)
Immature Granulocytes: 1 %
Lymphocytes Relative: 4 %
Lymphs Abs: 0.8 10*3/uL (ref 0.7–4.0)
MCH: 21.9 pg — ABNORMAL LOW (ref 26.0–34.0)
MCHC: 30.5 g/dL (ref 30.0–36.0)
MCV: 71.9 fL — ABNORMAL LOW (ref 80.0–100.0)
Monocytes Absolute: 1.1 10*3/uL — ABNORMAL HIGH (ref 0.1–1.0)
Monocytes Relative: 5 %
Neutro Abs: 19.9 10*3/uL — ABNORMAL HIGH (ref 1.7–7.7)
Neutrophils Relative %: 90 %
Platelets: 432 10*3/uL — ABNORMAL HIGH (ref 150–400)
RBC: 6.06 MIL/uL — ABNORMAL HIGH (ref 3.87–5.11)
RDW: 19.9 % — ABNORMAL HIGH (ref 11.5–15.5)
WBC: 22.2 10*3/uL — ABNORMAL HIGH (ref 4.0–10.5)
nRBC: 0 % (ref 0.0–0.2)

## 2022-06-17 LAB — POCT I-STAT 7, (LYTES, BLD GAS, ICA,H+H)
Acid-base deficit: 6 mmol/L — ABNORMAL HIGH (ref 0.0–2.0)
Bicarbonate: 18.7 mmol/L — ABNORMAL LOW (ref 20.0–28.0)
Calcium, Ion: 1.26 mmol/L (ref 1.15–1.40)
HCT: 33 % — ABNORMAL LOW (ref 36.0–46.0)
Hemoglobin: 11.2 g/dL — ABNORMAL LOW (ref 12.0–15.0)
O2 Saturation: 95 %
Patient temperature: 99.9
Potassium: 3.4 mmol/L — ABNORMAL LOW (ref 3.5–5.1)
Sodium: 149 mmol/L — ABNORMAL HIGH (ref 135–145)
TCO2: 20 mmol/L — ABNORMAL LOW (ref 22–32)
pCO2 arterial: 35.3 mmHg (ref 32–48)
pH, Arterial: 7.336 — ABNORMAL LOW (ref 7.35–7.45)
pO2, Arterial: 86 mmHg (ref 83–108)

## 2022-06-17 LAB — COMPREHENSIVE METABOLIC PANEL
ALT: 14 U/L (ref 0–44)
AST: 29 U/L (ref 15–41)
Albumin: 3.8 g/dL (ref 3.5–5.0)
Alkaline Phosphatase: 78 U/L (ref 38–126)
Anion gap: 16 — ABNORMAL HIGH (ref 5–15)
BUN: 43 mg/dL — ABNORMAL HIGH (ref 6–20)
CO2: 22 mmol/L (ref 22–32)
Calcium: 9.6 mg/dL (ref 8.9–10.3)
Chloride: 103 mmol/L (ref 98–111)
Creatinine, Ser: 2.03 mg/dL — ABNORMAL HIGH (ref 0.44–1.00)
GFR, Estimated: 30 mL/min — ABNORMAL LOW (ref >60)
Glucose, Bld: 118 mg/dL — ABNORMAL HIGH (ref 70–99)
Potassium: 3.2 mmol/L — ABNORMAL LOW (ref 3.5–5.1)
Sodium: 139 mmol/L (ref 135–145)
Total Bilirubin: 1.5 mg/dL — ABNORMAL HIGH (ref 0.3–1.2)
Total Protein: 8.9 g/dL — ABNORMAL HIGH (ref 6.5–8.1)

## 2022-06-17 LAB — BASIC METABOLIC PANEL
Anion gap: 11 (ref 5–15)
Anion gap: 7 (ref 5–15)
Anion gap: 7 (ref 5–15)
BUN: 39 mg/dL — ABNORMAL HIGH (ref 6–20)
BUN: 37 mg/dL — ABNORMAL HIGH (ref 6–20)
BUN: 37 mg/dL — ABNORMAL HIGH (ref 6–20)
BUN: 46 mg/dL — ABNORMAL HIGH (ref 6–20)
CO2: 21 mmol/L — ABNORMAL LOW (ref 22–32)
CO2: 20 mmol/L — ABNORMAL LOW (ref 22–32)
CO2: 19 mmol/L — ABNORMAL LOW (ref 22–32)
CO2: 20 mmol/L — ABNORMAL LOW (ref 22–32)
Calcium: 8.6 mg/dL — ABNORMAL LOW (ref 8.9–10.3)
Calcium: 8.4 mg/dL — ABNORMAL LOW (ref 8.9–10.3)
Calcium: 8.8 mg/dL — ABNORMAL LOW (ref 8.9–10.3)
Calcium: 8.7 mg/dL — ABNORMAL LOW (ref 8.9–10.3)
Chloride: 112 mmol/L — ABNORMAL HIGH (ref 98–111)
Chloride: 124 mmol/L — ABNORMAL HIGH (ref 98–111)
Chloride: 129 mmol/L — ABNORMAL HIGH (ref 98–111)
Chloride: >130 mmol/L (ref 98–111)
Creatinine, Ser: 2.15 mg/dL — ABNORMAL HIGH (ref 0.44–1.00)
Creatinine, Ser: 1.3 mg/dL — ABNORMAL HIGH (ref 0.44–1.00)
Creatinine, Ser: 1.32 mg/dL — ABNORMAL HIGH (ref 0.44–1.00)
Creatinine, Ser: 1.26 mg/dL — ABNORMAL HIGH (ref 0.44–1.00)
GFR, Estimated: 28 mL/min — ABNORMAL LOW (ref >60)
GFR, Estimated: 51 mL/min — ABNORMAL LOW (ref >60)
GFR, Estimated: 50 mL/min — ABNORMAL LOW (ref >60)
GFR, Estimated: 53 mL/min — ABNORMAL LOW (ref >60)
Glucose, Bld: 106 mg/dL — ABNORMAL HIGH (ref 70–99)
Glucose, Bld: 102 mg/dL — ABNORMAL HIGH (ref 70–99)
Glucose, Bld: 111 mg/dL — ABNORMAL HIGH (ref 70–99)
Glucose, Bld: 116 mg/dL — ABNORMAL HIGH (ref 70–99)
Potassium: 3.2 mmol/L — ABNORMAL LOW (ref 3.5–5.1)
Potassium: 3.5 mmol/L (ref 3.5–5.1)
Potassium: 4.6 mmol/L (ref 3.5–5.1)
Potassium: 4.6 mmol/L (ref 3.5–5.1)
Sodium: 144 mmol/L (ref 135–145)
Sodium: 151 mmol/L — ABNORMAL HIGH (ref 135–145)
Sodium: 155 mmol/L — ABNORMAL HIGH (ref 135–145)
Sodium: 157 mmol/L — ABNORMAL HIGH (ref 135–145)

## 2022-06-17 LAB — URINALYSIS, W/ REFLEX TO CULTURE (INFECTION SUSPECTED)
Bilirubin Urine: NEGATIVE
Glucose, UA: NEGATIVE mg/dL
Hgb urine dipstick: NEGATIVE
Ketones, ur: NEGATIVE mg/dL
Nitrite: NEGATIVE
Protein, ur: >=300 mg/dL — AB
Specific Gravity, Urine: 1.02 (ref 1.005–1.030)
pH: 5 (ref 5.0–8.0)

## 2022-06-17 LAB — BLOOD GAS, ARTERIAL
Acid-base deficit: 2.5 mmol/L — ABNORMAL HIGH (ref 0.0–2.0)
Bicarbonate: 21.7 mmol/L (ref 20.0–28.0)
FIO2: 80 %
MECHVT: 450 mL
Mechanical Rate: 30
O2 Saturation: 96.1 %
PEEP: 5 cmH2O
Patient temperature: 37
pCO2 arterial: 35 mmHg (ref 32–48)
pH, Arterial: 7.4 (ref 7.35–7.45)
pO2, Arterial: 78 mmHg — ABNORMAL LOW (ref 83–108)

## 2022-06-17 LAB — SALICYLATE LEVEL: Salicylate Lvl: <7 mg/dL — ABNORMAL LOW (ref 7.0–30.0)

## 2022-06-17 LAB — LACTIC ACID, PLASMA
Lactic Acid, Venous: 4.2 mmol/L (ref 0.5–1.9)
Lactic Acid, Venous: 1.2 mmol/L (ref 0.5–1.9)
Lactic Acid, Venous: 2 mmol/L (ref 0.5–1.9)

## 2022-06-17 LAB — URINE DRUG SCREEN, QUALITATIVE (ARMC ONLY)
Amphetamines, Ur Screen: NOT DETECTED
Barbiturates, Ur Screen: NOT DETECTED
Benzodiazepine, Ur Scrn: NOT DETECTED
Cannabinoid 50 Ng, Ur ~~LOC~~: NOT DETECTED
Cocaine Metabolite,Ur ~~LOC~~: POSITIVE — AB
MDMA (Ecstasy)Ur Screen: NOT DETECTED
Methadone Scn, Ur: NOT DETECTED
Opiate, Ur Screen: NOT DETECTED
Phencyclidine (PCP) Ur S: NOT DETECTED
Tricyclic, Ur Screen: NOT DETECTED

## 2022-06-17 LAB — ECHOCARDIOGRAM COMPLETE
AR max vel: 2.85 cm2
AV Area VTI: 2.39 cm2
AV Area mean vel: 2.41 cm2
AV Mean grad: 4 mmHg
AV Peak grad: 6.2 mmHg
Ao pk vel: 1.24 m/s
Area-P 1/2: 4.06 cm2
Calc EF: 51.7 %
Height: 65 in
MV VTI: 1.72 cm2
S' Lateral: 3.3 cm
Single Plane A2C EF: 50.4 %
Single Plane A4C EF: 51.2 %
Weight: 2123.47 oz

## 2022-06-17 LAB — GLUCOSE, CAPILLARY
Glucose-Capillary: 114 mg/dL — ABNORMAL HIGH (ref 70–99)
Glucose-Capillary: 108 mg/dL — ABNORMAL HIGH (ref 70–99)
Glucose-Capillary: 100 mg/dL — ABNORMAL HIGH (ref 70–99)
Glucose-Capillary: 95 mg/dL (ref 70–99)
Glucose-Capillary: 96 mg/dL (ref 70–99)
Glucose-Capillary: 106 mg/dL — ABNORMAL HIGH (ref 70–99)

## 2022-06-17 LAB — LIPASE, BLOOD: Lipase: 26 U/L (ref 11–51)

## 2022-06-17 LAB — TROPONIN I (HIGH SENSITIVITY)
Troponin I (High Sensitivity): 708 ng/L (ref <18)
Troponin I (High Sensitivity): 536 ng/L (ref <18)

## 2022-06-17 LAB — SODIUM: Sodium: 151 mmol/L — ABNORMAL HIGH (ref 135–145)

## 2022-06-17 LAB — POC URINE PREG, ED: Preg Test, Ur: NEGATIVE

## 2022-06-17 LAB — MAGNESIUM: Magnesium: 2 mg/dL (ref 1.7–2.4)

## 2022-06-17 LAB — CK: Total CK: 67 U/L (ref 38–234)

## 2022-06-17 LAB — APTT: aPTT: 32 seconds (ref 24–36)

## 2022-06-17 LAB — HIV ANTIBODY (ROUTINE TESTING W REFLEX): HIV Screen 4th Generation wRfx: NONREACTIVE

## 2022-06-17 LAB — MRSA NEXT GEN BY PCR, NASAL: MRSA by PCR Next Gen: DETECTED — AB

## 2022-06-17 LAB — ACETAMINOPHEN LEVEL: Acetaminophen (Tylenol), Serum: <10 ug/mL — ABNORMAL LOW (ref 10–30)

## 2022-06-17 LAB — ETHANOL: Alcohol, Ethyl (B): <10 mg/dL (ref <10)

## 2022-06-17 MED ORDER — CHLORHEXIDINE GLUCONATE CLOTH 2 % EX PADS
6.0000 | MEDICATED_PAD | Freq: Every day | CUTANEOUS | Status: DC
  Administered 2022-06-17 – 2022-06-30 (×15): 6 via TOPICAL

## 2022-06-17 MED ORDER — ROCURONIUM BROMIDE 10 MG/ML (PF) SYRINGE
PREFILLED_SYRINGE | INTRAVENOUS | Status: AC
  Filled 2022-06-17: qty 10

## 2022-06-17 MED ORDER — DOCUSATE SODIUM 50 MG/5ML PO LIQD
100.0000 mg | Freq: Two times a day (BID) | ORAL | Status: DC
  Administered 2022-06-17 – 2022-06-27 (×14): 100 mg
  Filled 2022-06-17 (×16): qty 10

## 2022-06-17 MED ORDER — MIDAZOLAM BOLUS VIA INFUSION
0.0000 mg | INTRAVENOUS | Status: DC | PRN
  Administered 2022-06-19: 2 mg via INTRAVENOUS

## 2022-06-17 MED ORDER — SODIUM CHLORIDE 0.9 % IV SOLN
500.0000 mg | INTRAVENOUS | Status: DC
  Administered 2022-06-17 – 2022-06-19 (×3): 500 mg via INTRAVENOUS
  Filled 2022-06-17 (×4): qty 5

## 2022-06-17 MED ORDER — ORAL CARE MOUTH RINSE
15.0000 mL | OROMUCOSAL | Status: DC
  Administered 2022-06-17 – 2022-07-01 (×164): 15 mL via OROMUCOSAL

## 2022-06-17 MED ORDER — MIDAZOLAM-SODIUM CHLORIDE 100-0.9 MG/100ML-% IV SOLN: 0.0000 mg/h | INTRAVENOUS | Status: DC

## 2022-06-17 MED ORDER — SODIUM CHLORIDE 3 % IV SOLN
INTRAVENOUS | Status: DC
  Filled 2022-06-17 (×4): qty 500

## 2022-06-17 MED ORDER — NOREPINEPHRINE 4 MG/250ML-% IV SOLN
INTRAVENOUS | Status: AC
  Administered 2022-06-17: 10 ug/min
  Filled 2022-06-17: qty 250

## 2022-06-17 MED ORDER — FENTANYL CITRATE PF 50 MCG/ML IJ SOSY: 50.0000 ug | PREFILLED_SYRINGE | Freq: Once | INTRAMUSCULAR | Status: DC

## 2022-06-17 MED ORDER — NICARDIPINE HCL IN NACL 20-0.86 MG/200ML-% IV SOLN
INTRAVENOUS | Status: AC
  Administered 2022-06-17: 5 mg/h via INTRAVENOUS
  Filled 2022-06-17: qty 200

## 2022-06-17 MED ORDER — ACETAMINOPHEN 325 MG PO TABS
650.0000 mg | ORAL_TABLET | ORAL | Status: DC | PRN
  Administered 2022-06-18 – 2022-06-29 (×6): 650 mg
  Filled 2022-06-17 (×7): qty 2

## 2022-06-17 MED ORDER — PROSOURCE TF20 ENFIT COMPATIBL EN LIQD
60.0000 mL | Freq: Every day | ENTERAL | Status: DC
  Administered 2022-06-17 – 2022-07-01 (×15): 60 mL
  Filled 2022-06-17 (×15): qty 60

## 2022-06-17 MED ORDER — FENTANYL 2500MCG IN NS 250ML (10MCG/ML) PREMIX INFUSION: 50.0000 ug/h | INTRAVENOUS | Status: DC

## 2022-06-17 MED ORDER — ACETAMINOPHEN 500 MG PO TABS
ORAL_TABLET | ORAL | Status: AC
  Administered 2022-06-17: 1000 mg via NASOGASTRIC
  Filled 2022-06-17: qty 2

## 2022-06-17 MED ORDER — VITAL 1.5 CAL PO LIQD
1000.0000 mL | ORAL | Status: DC
  Administered 2022-06-17 – 2022-06-30 (×12): 1000 mL
  Filled 2022-06-17: qty 1000

## 2022-06-17 MED ORDER — METRONIDAZOLE 500 MG/100ML IV SOLN
500.0000 mg | Freq: Once | INTRAVENOUS | Status: AC
  Administered 2022-06-17: 500 mg via INTRAVENOUS
  Filled 2022-06-17: qty 100

## 2022-06-17 MED ORDER — EPINEPHRINE 1 MG/10ML IJ SOSY
PREFILLED_SYRINGE | INTRAMUSCULAR | Status: DC | PRN
  Administered 2022-06-17: .1 mg via INTRAVENOUS

## 2022-06-17 MED ORDER — ACETAMINOPHEN 160 MG/5ML PO SOLN
650.0000 mg | ORAL | Status: DC | PRN
  Administered 2022-06-17 – 2022-07-01 (×10): 650 mg
  Filled 2022-06-17 (×8): qty 20.3

## 2022-06-17 MED ORDER — KETAMINE HCL 10 MG/ML IJ SOLN
INTRAMUSCULAR | Status: DC | PRN
  Administered 2022-06-17: 75 mg via INTRAVENOUS

## 2022-06-17 MED ORDER — POLYETHYLENE GLYCOL 3350 17 G PO PACK
17.0000 g | PACK | Freq: Every day | ORAL | Status: DC
  Administered 2022-06-18 – 2022-06-21 (×4): 17 g
  Filled 2022-06-17 (×5): qty 1

## 2022-06-17 MED ORDER — ACETAMINOPHEN 650 MG RE SUPP: 650.0000 mg | RECTAL | Status: DC | PRN

## 2022-06-17 MED ORDER — KETAMINE HCL 10 MG/ML IJ SOLN
INTRAMUSCULAR | Status: AC
  Filled 2022-06-17: qty 1

## 2022-06-17 MED ORDER — STROKE: EARLY STAGES OF RECOVERY BOOK: Freq: Once | Status: DC

## 2022-06-17 MED ORDER — LABETALOL HCL 5 MG/ML IV SOLN
20.0000 mg | INTRAVENOUS | Status: DC | PRN
  Administered 2022-06-18 (×2): 20 mg via INTRAVENOUS
  Filled 2022-06-17 (×2): qty 4

## 2022-06-17 MED ORDER — PANTOPRAZOLE SODIUM 40 MG IV SOLR
40.0000 mg | Freq: Every day | INTRAVENOUS | Status: DC
  Administered 2022-06-17 – 2022-06-30 (×14): 40 mg via INTRAVENOUS
  Filled 2022-06-17 (×11): qty 10

## 2022-06-17 MED ORDER — POTASSIUM CHLORIDE 20 MEQ PO PACK
40.0000 meq | PACK | Freq: Once | ORAL | Status: AC
  Administered 2022-06-17: 40 meq
  Filled 2022-06-17: qty 2

## 2022-06-17 MED ORDER — EPINEPHRINE 1 MG/10ML IJ SOSY
PREFILLED_SYRINGE | INTRAMUSCULAR | Status: AC
  Filled 2022-06-17: qty 10

## 2022-06-17 MED ORDER — ORAL CARE MOUTH RINSE: 15.0000 mL | OROMUCOSAL | Status: DC | PRN

## 2022-06-17 MED ORDER — SODIUM CHLORIDE 0.9 % IV SOLN
2.0000 g | INTRAVENOUS | Status: DC
  Administered 2022-06-17 – 2022-06-20 (×4): 2 g via INTRAVENOUS
  Filled 2022-06-17 (×4): qty 20

## 2022-06-17 MED ORDER — FENTANYL BOLUS VIA INFUSION: 50.0000 ug | INTRAVENOUS | Status: DC | PRN

## 2022-06-17 MED ORDER — ACETAMINOPHEN 500 MG PO TABS: 1000.0000 mg | ORAL_TABLET | Freq: Once | ORAL | Status: AC

## 2022-06-17 MED ORDER — NICARDIPINE HCL IN NACL 20-0.86 MG/200ML-% IV SOLN: 3.0000 mg/h | INTRAVENOUS | Status: DC

## 2022-06-17 MED ORDER — SODIUM CHLORIDE 0.9 % IV SOLN
1.0000 g | Freq: Once | INTRAVENOUS | Status: AC
  Administered 2022-06-17: 1 g via INTRAVENOUS
  Filled 2022-06-17: qty 10

## 2022-06-17 MED ORDER — HYDRALAZINE HCL 20 MG/ML IJ SOLN
20.0000 mg | Freq: Four times a day (QID) | INTRAMUSCULAR | Status: DC | PRN
  Administered 2022-06-18: 20 mg via INTRAVENOUS
  Filled 2022-06-17: qty 1

## 2022-06-17 MED ORDER — SODIUM CHLORIDE 0.9 % IV BOLUS
500.0000 mL | Freq: Once | INTRAVENOUS | Status: AC
  Administered 2022-06-17: 500 mL via INTRAVENOUS

## 2022-06-17 MED ORDER — IOHEXOL 350 MG/ML SOLN
75.0000 mL | Freq: Once | INTRAVENOUS | Status: AC | PRN
  Administered 2022-06-17: 75 mL via INTRAVENOUS

## 2022-06-17 MED ORDER — FENTANYL 2500MCG IN NS 250ML (10MCG/ML) PREMIX INFUSION
INTRAVENOUS | Status: AC
  Administered 2022-06-17: 50 ug/h via INTRAVENOUS
  Filled 2022-06-17: qty 250

## 2022-06-17 MED ORDER — SODIUM CHLORIDE 3 % IV SOLN
INTRAVENOUS | Status: DC
  Filled 2022-06-17 (×2): qty 500

## 2022-06-17 MED ORDER — FENTANYL 2500MCG IN NS 250ML (10MCG/ML) PREMIX INFUSION
50.0000 ug/h | INTRAVENOUS | Status: DC
  Administered 2022-06-17: 75 ug/h via INTRAVENOUS
  Administered 2022-06-18 – 2022-06-19 (×3): 125 ug/h via INTRAVENOUS
  Administered 2022-06-21 – 2022-06-22 (×2): 100 ug/h via INTRAVENOUS
  Administered 2022-06-23: 150 ug/h via INTRAVENOUS
  Administered 2022-06-24: 75 ug/h via INTRAVENOUS
  Administered 2022-06-25: 50 ug/h via INTRAVENOUS
  Administered 2022-06-26: 200 ug/h via INTRAVENOUS
  Filled 2022-06-17 (×10): qty 250

## 2022-06-17 MED ORDER — FENTANYL BOLUS VIA INFUSION
50.0000 ug | INTRAVENOUS | Status: DC | PRN
  Administered 2022-06-18: 50 ug via INTRAVENOUS
  Administered 2022-06-18 (×2): 100 ug via INTRAVENOUS
  Administered 2022-06-18: 50 ug via INTRAVENOUS
  Administered 2022-06-18 – 2022-06-19 (×2): 100 ug via INTRAVENOUS
  Administered 2022-06-19: 50 ug via INTRAVENOUS
  Administered 2022-06-20: 100 ug via INTRAVENOUS
  Administered 2022-06-21: 50 ug via INTRAVENOUS
  Administered 2022-06-22 (×2): 100 ug via INTRAVENOUS
  Administered 2022-06-24 (×4): 50 ug via INTRAVENOUS
  Administered 2022-06-25 – 2022-06-27 (×4): 100 ug via INTRAVENOUS
  Administered 2022-06-27 (×2): 50 ug via INTRAVENOUS

## 2022-06-17 MED ORDER — ROCURONIUM BROMIDE 50 MG/5ML IV SOLN
INTRAVENOUS | Status: DC | PRN
  Administered 2022-06-17: 75 mg via INTRAVENOUS

## 2022-06-17 MED ORDER — VITAL HIGH PROTEIN PO LIQD: 1000.0000 mL | ORAL | Status: DC

## 2022-06-17 MED ORDER — PROPOFOL 1000 MG/100ML IV EMUL
INTRAVENOUS | Status: AC
  Filled 2022-06-17: qty 100

## 2022-06-17 MED ORDER — SENNOSIDES-DOCUSATE SODIUM 8.6-50 MG PO TABS
1.0000 | ORAL_TABLET | Freq: Two times a day (BID) | ORAL | Status: DC
  Administered 2022-06-17 – 2022-06-26 (×14): 1
  Filled 2022-06-17 (×15): qty 1

## 2022-06-17 MED ORDER — SODIUM CHLORIDE 0.9 % IV BOLUS
1000.0000 mL | Freq: Once | INTRAVENOUS | Status: AC
  Administered 2022-06-17: 1000 mL via INTRAVENOUS

## 2022-06-17 NOTE — ED Notes (Signed)
CODE STROKE called to [facility])

## 2022-06-17 NOTE — Hospital Course (Signed)
Cocaine-induced hypertensive lobar hemorrhage

## 2022-06-17 NOTE — ED Provider Notes (Signed)
[facility] Provider Note   Event Date/Time   First MD Initiated Contact with Patient 06/17/22 0154     (approximate) History  Altered Mental Status  HPI Rebecca [name] is a 47 y.o. female with reported history of cocaine abuse who presents via EMS after being found unresponsive at home.  Per EMS, patient had been checked on once today and thought that she was sleeping.  They also note a history in the patient of substance abuse including cocaine.  Patient arrives GCS of 8 and further history/review of systems are unable to be obtained at this time secondary to clinical severity ROS: Unable to assess   Physical Exam  Triage Vital Signs: ED Triage Vitals [06/17/22 0146]  Enc Vitals Group     BP 93/61     Pulse Rate (!) 122     Resp 20     Temp      Temp src      SpO2      Weight      Height      Head Circumference      Peak Flow      Pain Score      Pain Loc      Pain Edu?      Excl. in GC?    Most recent vital signs: Vitals:   06/17/22 0500 06/17/22 0515  BP: (!) 123/93 105/82  Pulse: (!) 118 (!) 121  Resp: (!) 34 (!) 30  Temp: 100.1 F (37.8 C) 100 F (37.8 C)  SpO2: 99% 99%   General: Unresponsive on stretcher with vomitus/frothing sputum around the mouth CV:  Good peripheral perfusion.  Resp:  Increased effort.  Abd:  No distention.  Other:  Middle-aged African-American female laying in bed unresponsive with 15 L nonrebreather in place ED Results / Procedures / Treatments  Labs (all labs ordered are listed, but only abnormal results are displayed) Labs Reviewed  COMPREHENSIVE METABOLIC PANEL - Abnormal; Notable for the following components:      Result Value   Potassium 3.2 (*)    Glucose, Bld 118 (*)    BUN 43 (*)    Creatinine, Ser 2.03 (*)    Total Protein 8.9 (*)    Total Bilirubin 1.5 (*)    GFR, Estimated 30 (*)    Anion gap 16 (*)    All other components within normal limits  ACETAMINOPHEN LEVEL - Abnormal; Notable  for the following components:   Acetaminophen (Tylenol), Serum <10 (*)    All other components within normal limits  LACTIC ACID, PLASMA - Abnormal; Notable for the following components:   Lactic Acid, Venous 4.2 (*)    All other components within normal limits  CBC WITH DIFFERENTIAL/PLATELET - Abnormal; Notable for the following components:   WBC 22.2 (*)    RBC 6.06 (*)    MCV 71.9 (*)    MCH 21.9 (*)    RDW 19.9 (*)    Platelets 432 (*)    Neutro Abs 19.9 (*)    Monocytes Absolute 1.1 (*)    Abs Immature Granulocytes 0.30 (*)    All other components within normal limits  BLOOD GAS, ARTERIAL - Abnormal; Notable for the following components:   pO2, Arterial 78 (*)    Acid-base deficit 2.5 (*)    All other components within normal limits  URINALYSIS, W/ REFLEX TO CULTURE (INFECTION SUSPECTED) - Abnormal; Notable for the following components:   Color, Urine AMBER (*)  APPearance HAZY (*)    Protein, ur >=300 (*)    Leukocytes,Ua MODERATE (*)    Bacteria, UA MANY (*)    All other components within normal limits  URINE DRUG SCREEN, QUALITATIVE ([facility] ONLY) - Abnormal; Notable for the following components:   Cocaine Metabolite,Ur Center POSITIVE (*)    All other components within normal limits  SALICYLATE LEVEL - Abnormal; Notable for the following components:   Salicylate Lvl <7.0 (*)    All other components within normal limits  PROTIME-INR - Abnormal; Notable for the following components:   Prothrombin Time 16.6 (*)    INR 1.3 (*)    All other components within normal limits  TROPONIN I (HIGH SENSITIVITY) - Abnormal; Notable for the following components:   Troponin I (High Sensitivity) 708 (*)    All other components within normal limits  URINE CULTURE  ETHANOL  LIPASE, BLOOD  APTT  LACTIC ACID, PLASMA  SODIUM  SODIUM  SODIUM  SODIUM  SODIUM  SODIUM  POC URINE PREG, ED  TYPE AND SCREEN  TROPONIN I (HIGH SENSITIVITY)   EKG ED ECG REPORT I, Merwyn Katos, the  attending physician, personally viewed and interpreted this ECG. Date: 06/17/2022 EKG Time: 0139 Rate: 114 Rhythm: Tachycardic sinus rhythm QRS Axis: normal Intervals: normal ST/T Wave abnormalities: normal Narrative Interpretation: Tachycardic sinus rhythm.  No evidence of acute ischemia RADIOLOGY ED MD interpretation: CT of the head without contrast interpreted independently by me and shows right-sided intraparenchymal hemorrhage with associated edema and mild midline shift of 5 mm from right to left -Agree with radiology assessment Official radiology report(s): CT VENOGRAM HEAD  Result Date: 06/17/2022 CLINICAL DATA:  Intracranial hemorrhage. EXAM: CT VENOGRAM HEAD TECHNIQUE: Venographic phase images of the brain were obtained following the administration of intravenous contrast. Multiplanar reformats and maximum intensity projections were generated. RADIATION DOSE REDUCTION: This exam was performed according to the departmental dose-optimization program which includes automated exposure control, adjustment of the mA and/or kV according to patient size and/or use of iterative reconstruction technique. CONTRAST:  75mL OMNIPAQUE IOHEXOL 350 MG/ML SOLN COMPARISON:  None Available. FINDINGS: Filling defect of the right transverse sinus and right transverse sigmoid junction which are somewhat large and tubular for arachnoid granulations, although an arachnoid granulation at the left transverse sigmoid junction appears nearly similar. The location would also not explain the acute hemorrhage. No visible cortical or central venous thrombosis. IMPRESSION: No cortical or central venous thrombosis to explain the ICH. There are filling defects at the right transverse and transverse sigmoid dural sinuses which are prominent for arachnoid granulations but not definitive for thrombus. Attention at follow-up imaging. Electronically Signed   By: Tiburcio Pea M.D.   On: 06/17/2022 05:30   CT ANGIO HEAD NECK W  WO CM  Result Date: 06/17/2022 CLINICAL DATA:  Found unresponsive. Intracranial hemorrhage. History of cocaine use EXAM: CT ANGIOGRAPHY HEAD AND NECK WITH AND WITHOUT CONTRAST TECHNIQUE: Multidetector CT imaging of the head and neck was performed using the standard protocol during bolus administration of intravenous contrast. Multiplanar CT image reconstructions and MIPs were obtained to evaluate the vascular anatomy. Carotid stenosis measurements (when applicable) are obtained utilizing NASCET criteria, using the distal internal carotid diameter as the denominator. RADIATION DOSE REDUCTION: This exam was performed according to the departmental dose-optimization program which includes automated exposure control, adjustment of the mA and/or kV according to patient size and/or use of iterative reconstruction technique. CONTRAST:  75mL OMNIPAQUE IOHEXOL 350 MG/ML SOLN COMPARISON:  Head CT  from earlier today FINDINGS: CTA NECK FINDINGS Aortic arch: Unremarkable Right carotid system: Mixed density plaque at the bifurcation with some low-density plaque bulging into the ICA bulb. No ulceration or flow limiting stenosis Left carotid system: Mainly low-density plaque at the bifurcation without stenosis or ulceration. Vertebral arteries: No proximal subclavian stenosis. The vertebral arteries are somewhat tortuous but smoothly contoured and diffusely patent Skeleton: No acute or aggressive finding. Other neck: No acute finding Upper chest: Mild opacification of dependent lungs which could be atelectasis. Centrilobular emphysema. Review of the MIP images confirms the above findings CTA HEAD FINDINGS Anterior circulation: No aneurysm or spot sign seen underlying the ICH. There is a aneurysm projecting leftward from the left carotid terminus which measures 4 mm. No adjacent hemorrhage. No major branch occlusion. There is undulation of the bilateral intracranial branches which is likely atheromatous given findings in the neck.  No segmental beading. Some less intense flow and right MCA branches is likely related to mass effect by the ICH. Posterior circulation: The vertebral and basilar arteries arediffusely patent. Undulation of the left more than right PCA with up to moderate narrowing on the left, likely atheromatous given the findings in the neck. Venous sinuses: Reference dedicated CT venogram Anatomic variants: No acute finding Review of the MIP images confirms the above findings IMPRESSION: 1. No vascular lesion or spot sign seen at the acute ICH. 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 3. 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 Electronically Signed   By: Tiburcio Pea M.D.   On: 06/17/2022 05:18   CT Head Wo Contrast  Result Date: 06/17/2022 CLINICAL DATA:  Altered mental status EXAM: CT HEAD WITHOUT CONTRAST TECHNIQUE: Contiguous axial images were obtained from the base of the skull through the vertex without intravenous contrast. RADIATION DOSE REDUCTION: This exam was performed according to the departmental dose-optimization program which includes automated exposure control, adjustment of the mA and/or kV according to patient size and/or use of iterative reconstruction technique. COMPARISON:  None Available. FINDINGS: Brain: There is a large area of parenchymal hemorrhage identified in the right cerebral hemisphere centered primarily within the basal ganglia and extending superiorly into the centrum semi ovale. This measures approximately 4.7 x 4.0 cm in greatest AP and transverse dimensions respectively. It extends for approximately 4.7 cm in craniocaudad dimension. Significant mass-effect upon the right lateral ventricle is noted. Mild midline shift of approximately 5 mm is noted. Some surrounding edema is seen as well. No other hemorrhage is noted. Relative loss of the sulcal markings is noted likely related to some increased intracranial pressure. Vascular: No hyperdense vessel or  unexpected calcification. Skull: Normal. Negative for fracture or focal lesion. Sinuses/Orbits: No acute finding. Other: None. IMPRESSION: Right-sided intraparenchymal hemorrhage as described with associated edema and mild midline shift of 5 mm from right to left. Critical Value/emergent results were called by telephone at the time of interpretation on 06/17/2022 at 2:27 am to Dr. Donna Bernard , who verbally acknowledged these results. Electronically Signed   By: Alcide Clever M.D.   On: 06/17/2022 02:29   DG Abdomen 1 View  Result Date: 06/17/2022 CLINICAL DATA:  Check gastric catheter placement EXAM: ABDOMEN - 1 VIEW COMPARISON:  None Available. FINDINGS: Gastric catheter is noted extending into the stomach. Proximal side port lies at the gastroesophageal junction. This should be advanced further into the stomach. No free air is seen. No bony abnormality is noted. IMPRESSION: Gastric catheter as described. Electronically Signed   By: Eulah Pont.D.  On: 06/17/2022 02:16   DG Chest Port 1 View  Result Date: 06/17/2022 CLINICAL DATA:  Check endotracheal tube placement EXAM: PORTABLE CHEST 1 VIEW COMPARISON:  10/21/2020 FINDINGS: Cardiac shadow is within normal limits. Endotracheal tube is noted 1.5 cm above the carina. Gastric catheter is noted extending into the stomach. Patchy increased density is noted in the right middle lobe beneath the minor fissure consistent with acute infiltrate. No sizable effusion is noted. No bony abnormality is seen. IMPRESSION: Right middle lobe infiltrate. Tubes and lines as described. Electronically Signed   By: Alcide Clever M.D.   On: 06/17/2022 02:16   PROCEDURES: Critical Care performed: Yes, see critical care procedure note(s) .1-3 Lead EKG Interpretation  Performed by: Merwyn Katos, MD Authorized by: Merwyn Katos, MD     Interpretation: abnormal     ECG rate:  122   ECG rate assessment: tachycardic     Rhythm: sinus tachycardia     Ectopy: none      Conduction: normal   CRITICAL CARE Performed by: Merwyn Katos  Total critical care time: 57 minutes  Critical care time was exclusive of separately billable procedures and treating other patients.  Critical care was necessary to treat or prevent imminent or life-threatening deterioration.  Critical care was time spent personally by me on the following activities: development of treatment plan with patient and/or surrogate as well as nursing, discussions with consultants, evaluation of patient's response to treatment, examination of patient, obtaining history from patient or surrogate, ordering and performing treatments and interventions, ordering and review of laboratory studies, ordering and review of radiographic studies, pulse oximetry and re-evaluation of patient's condition.  MEDICATIONS ORDERED IN ED: Medications  ketamine (KETALAR) injection ( Intravenous Canceled Entry 06/17/22 0145)  rocuronium (ZEMURON) injection ( Intravenous Canceled Entry 06/17/22 0145)  EPINEPHrine (ADRENALIN) 1 MG/10ML injection ( Intravenous Not Given 06/17/22 0157)  propofol (DIPRIVAN) 1000 MG/100ML infusion (  Not Given 06/17/22 0406)  sodium chloride (hypertonic) 3 % solution ( Intravenous New Bag/Given 06/17/22 0323)  fentaNYL (SUBLIMAZE) injection 50 mcg (50 mcg Intravenous Not Given 06/17/22 0405)  fentaNYL in NS (62mcg/ml) infusion-PREMIX (150 mcg/hr Intravenous Rate/Dose Change 06/17/22 0412)  fentaNYL (SUBLIMAZE) bolus via infusion 50-100 mcg (has no administration in time range)  nicardipine (CARDENE) 20mg  in 0.86% saline IV infusion (0.1 mg/ml) (5 mg/hr Intravenous New Bag/Given 06/17/22 0432)  norepinephrine (LEVOPHED) 4-5 MG/250ML-% infusion SOLN (0 mcg/kg/min  Stopped 06/17/22 0234)  sodium chloride 0.9 % bolus 1,000 mL (1,000 mLs Intravenous New Bag/Given 06/17/22 0219)  acetaminophen (TYLENOL) tablet 1,000 mg (1,000 mg Per NG tube Given 06/17/22 0247)  cefTRIAXone (ROCEPHIN) 1 g in  sodium chloride 0.9 % 100 mL IVPB (1 g Intravenous New Bag/Given 06/17/22 0403)  metroNIDAZOLE (FLAGYL) IVPB 500 mg (500 mg Intravenous New Bag/Given 06/17/22 0409)  iohexol (OMNIPAQUE) 350 MG/ML injection 75 mL (75 mLs Intravenous Contrast Given 06/17/22 0449)   IMPRESSION / MDM / ASSESSMENT AND PLAN / ED COURSE  I reviewed the triage vital signs and the nursing notes.                             The patient is on the cardiac monitor to evaluate for evidence of arrhythmia and/or significant heart rate changes. Patient's presentation is most consistent with acute presentation with potential threat to life or bodily function. + neurological deficits from baseline including GCS of 8 + History of cocaine abuse These  findings progressed over 24 hours Patient was intubated on arrival secondary to mental status Unlikely infectious etiology  Workup: POCT glucose. CBC, BMP, PT/INR, PTT, troponin, type and screen ECG and non-contrast head CT  Findings: CT Brain: intracranial hemorrhage in the left intraparenchymal area ECG: No cerebral T waves. No STEMI  Interventions: BP Control: PRN Nicardipine 5mg /hr by slow infusion (20ml/hr) titrating to maximum of 30mg /hr   Consults: Neurosurgery-spoke to Dr. Marcell Barlow who recommends admission to neurocritical care at [facility] neurocritical care-agrees to accept this patient onto their service for further evaluation and management   FINAL CLINICAL IMPRESSION(S) / ED DIAGNOSES   Final diagnoses:  Glasgow coma scale total score 3-8, at arrival to emergency department [facility])  Intraparenchymal hematoma of brain, right, with loss of consciousness greater than 24 hours without return to pre-existing conscious level with patient surviving, initial encounter [facility])   Rx / DC Orders   ED Discharge Orders     None      Note:  This document was prepared using Dragon voice recognition software and may include unintentional dictation errors.    Merwyn Katos, MD 06/17/22 [phone]

## 2022-06-17 NOTE — Progress Notes (Signed)
LTM EEG hooked up and running with MRI compatible leads. Patient has atrium monitoring and test button was tested.

## 2022-06-17 NOTE — Progress Notes (Signed)
Transport to CT, second visit, without incident. Pt remains on doc vent settings. Pt still currently in CT at this time.

## 2022-06-17 NOTE — Progress Notes (Signed)
OT Cancellation Note  Patient Details Name: Rebecca [name] MRN: [id number] DOB: 05-09-1975   Cancelled Treatment:    Reason Eval/Treat Not Completed: Active bedrest order;Patient not medically ready  [name],HILLARY 06/17/2022, 9:46 AM Luisa Dago, OT/L   Acute OT Clinical Specialist Acute Rehabilitation Services Pager [phone] Office [phone]

## 2022-06-17 NOTE — ED Triage Notes (Signed)
Arrives via [facility] from home after being found unresponsive by family earlier yesterday evening.  Upon EMS patient was placed on NRB at 15 LPM, patient was noted to be minimally responsive to painful stimuli and have unequal pupils.  Immediately upon arrival to ED decision to intubate was made by Donna Bernard MD.

## 2022-06-17 NOTE — Progress Notes (Signed)
MEDICATION RELATED CONSULT NOTE - INITIAL   Pharmacy Consult for Hypertonic Saline  Indication:  intracranial hemorrhage/Stroke   No Known Allergies  Patient Measurements:   Adjusted Body Weight:   Vital Signs: BP: 126/86 (05/10 0300) Pulse Rate: 122 (05/10 0300) Intake/Output from previous day: No intake/output data recorded. Intake/Output from this shift: No intake/output data recorded.  Labs: Recent Labs    06/17/22 0145  WBC 22.2*  HGB 13.3  HCT 43.6  PLT 432*  CREATININE 2.03*  ALBUMIN 3.8  PROT 8.9*  AST 29  ALT 14  ALKPHOS 78  BILITOT 1.5*   CrCl cannot be calculated (Unknown ideal weight.).   Microbiology: No results found for this or any previous visit (from the past 720 hour(s)).  Medical History: No past medical history on file.  Medications:  (Not in a hospital admission)   Assessment: Pharmacy consulted to monitor Na in 47 yo F with intracranial hemorrhage.   5/10:  Na @ 0145 = 139 3 % NaCl started @ 0323 at 75 ml/hr.   Goal of Therapy:  Reduction of intracranial pressure   No increase in Na > 4 mEq/L in first 2 hrs No increase in Na > 6 mEq/L in first 4 hrs   Plan:  Will check Na Q2H X 2 then Q4H  Will notify provider if Na rate exceeds above rate.   [name] 06/17/2022,3:56 AM

## 2022-06-17 NOTE — Progress Notes (Addendum)
eLink Physician-Brief Progress Note Patient Name: Rebecca [name] DOB: Jul 18, 1975 MRN: [id number]   Date of Service  06/17/2022  HPI/Events of Note  Patient admitted with altered mental status, acute respiratory failure requiring intubation and mechanical ventilation, secondary to a right intra-parenchymal hemorrhage extending into the basal ganglia, Neurology and neurosurgery following, considering transfer to a higher level of care. There is associated cerebral edema with mass effect and midline shift.  eICU Interventions  New Patient Evaluation.        Migdalia Dk 06/17/2022, 6:43 AM

## 2022-06-17 NOTE — Progress Notes (Signed)
SLP Cancellation Note  Patient Details Name: DANETTE BILA MRN: [id number] DOB: [birth year]   Cancelled treatment:       Reason Eval/Treat Not Completed: Medical issues which prohibited therapy (Pt on the vent at this time. SLP will follow up on subsequent date.)  Sinan Tuch I. Vear Clock, MS, CCC-SLP Acute Rehabilitation Services Office number [phone]  Scheryl Marten 06/17/2022, 8:57 AM

## 2022-06-17 NOTE — Progress Notes (Signed)
Pt transported to CT and back on vent with doc settings w/o incident

## 2022-06-17 NOTE — Progress Notes (Signed)
Initial Nutrition Assessment  DOCUMENTATION CODES:   Not applicable  INTERVENTION:  Once OGT confirmed to be in appropriate position per abdominal x-ray, initiate enteral nutrition via OG tube: -Initiate Vital 1.5 at 15 mL/hour and advance by 15 mL/hour every 8 hours to goal rate of 45 mL/hour (1080 mL goal daily volume) -Provide PROSource TF20 60 mL daily per tube -Provides: 1700 kcal, 93 grams of protein, 821 mL H2O daily  NUTRITION DIAGNOSIS:   Inadequate oral intake related to inability to eat as evidenced by NPO status.  GOAL:   Provide needs based on ASPEN/SCCM guidelines  MONITOR:   Vent status, Labs, Weight trends, TF tolerance, I & O's  REASON FOR ASSESSMENT:   Ventilator, Consult Enteral/tube feeding initiation and management  ASSESSMENT:   47 year old female with PMHx of polysubstance abuse admitted to [facility] ED with AMS and intubated for airway protection, found to have large right intraparenchymal hemorrhage with 5 mm midline shift, UDS positive for cocaine. Transferred to [facility] on 06/17/22.  Pt intubated and sedated. No family members at bedside at time of RD assessment so unable to obtain history. Discussed with RN at bedside. Obtained bed scale weight of 60.2 kg with RN as no weight yet entered in chart, but it is unclear if weight was previously zeroed. Also, plan is to advance OG tube and obtain another abdominal x-ray to verify correct placement prior to initiation of tube feeds. MD ordered tube feed protocol and c/s for initiation and management of enteral nutrition.  Last weight available in chart was 63.5 kg on 10/21/2020, so no recent weight history available to trend.   Patient is currently intubated on ventilator support MV: 8.3 L/min Temp (24hrs), Avg:99.8 F (37.7 C), Min:99.1 F (37.3 C), Max:100.8 F (38.2 C)  Medications reviewed and include: Colace 100 mg BID, pantoprazole, Miralax, potassium chloride 40 mEq once today, senna-docusate 1  tablet BID, azithromycin, ceftriaxone, Fentanyl gtt, Versed gtt, hypertonic saline at 75 mL/hour  Labs reviewed: CBG 108-114, Sodium 149, Potassium 3.4, Chloride 112, CO2 21, BUN 39, Creatinine 2.15, Magnesium 2  Enteral Access: 18 Fr. OGT placed 5/10; 54 cm at corner of mouth; side port at GE junction and advancement recommended per abdominal x-ray 5/10 Tube was advanced by RN this AM and repeat abdominal x-ray was obtained at 1010. At 1543 x-ray has still not been read. Plan is to initiate tube feeds once tube confirmed to be in appropriate position per x-ray.  UOP: 50 mL UOP since admission  I/O: +270.6 mL since admission  NUTRITION - FOCUSED PHYSICAL EXAM:  Flowsheet Row Most Recent Value  Orbital Region No depletion  Upper Arm Region No depletion  Thoracic and Lumbar Region No depletion  Buccal Region Unable to assess  Temple Region No depletion  Clavicle Bone Region No depletion  Clavicle and Acromion Bone Region No depletion  Scapular Bone Region Unable to assess  Dorsal Hand No depletion  Patellar Region No depletion  Anterior Thigh Region No depletion  Posterior Calf Region No depletion  Edema (RD Assessment) None  Hair Reviewed  Eyes Unable to assess  Mouth Unable to assess  Skin Reviewed  Nails Reviewed      Diet Order:   Diet Order             Diet NPO time specified  Diet effective now                  EDUCATION NEEDS:   No education needs have  been identified at this time  Skin:  Skin Assessment: Reviewed RN Assessment  Last BM:  Unknown/PTA  Height:   Ht Readings from Last 1 Encounters:  06/17/22 5\' 5"  (1.651 m)   Weight:   Wt Readings from Last 1 Encounters:  06/17/22 60.2 kg   Ideal Body Weight:  56.8 kg  BMI:  Body mass index is 22.09 kg/m.  Estimated Nutritional Needs:   Kcal:  1600-1800  Protein:  85-95 grams  Fluid:  >/= 1.8 L/day or per MD  Letta Median, MS, RD, LDN, CNSC Pager number available on Amion

## 2022-06-17 NOTE — Progress Notes (Signed)
Pt transferred over to [facility] staff vent at this time w/o incident.

## 2022-06-17 NOTE — H&P (Signed)
NAME:  Rebecca [name], MRN:  [id number], DOB:  29-Jul-1975, LOS: 0 ADMISSION DATE:  06/17/2022, CONSULTATION DATE:  06/17/22 REFERRING MD:  EDP, CHIEF COMPLAINT:  unresponsive   History of Present Illness:  47 yo female with hx of polysubstance abuse presented brought to [facility] ED with AMS.  Intubated for airway protection.  CT head showed large Rt ICH with 5 mm midline shift.  UDS positive for cocaine.  Transferred to [facility] for further management.  PCCM consulted to assist with management in ICU.   Hx from chart and medical team.  Pertinent  Medical History  Substance abuse  Significant Hospital Events: Including procedures, antibiotic start and stop dates in addition to other pertinent events   5/10 Admit, neurosurgery consulted  Interim History / Subjective:  On 3% saline and fentanyl.  Objective   Blood pressure 102/87, pulse (!) 105, temperature 100 F (37.8 C), resp. rate (!) 30, height 5\' 5"  (1.651 m), SpO2 100 %.    Vent Mode: PRVC FiO2 (%):  [80 %] 80 % Set Rate:  [30 bmp] 30 bmp Vt Set:  [450 mL] 450 mL PEEP:  [5 cmH20] 5 cmH20   Intake/Output Summary (Last 24 hours) at 06/17/2022 0742 Last data filed at 06/17/2022 0645 Gross per 24 hour  Intake 0 ml  Output 50 ml  Net -50 ml   There were no vitals filed for this visit.  General - sedated Eyes - pupils pinpoint ENT - no sinus tenderness, no stridor Cardiac - regular rate/rhythm, no murmur Chest - equal breath sounds b/l, no wheezing or rales Abdomen - soft, non tender, + bowel sounds Extremities - no cyanosis, clubbing, or edema Skin - mild abrasion over abdomen Neuro - not following commands   Resolved Hospital Problem list     Assessment & Plan:   Right intraparenchymal hemorrhage with mass effect and brain compression with mid-line shift. - not candidate for neurosurgical intervention - continue 3% saline - f/u MRI brain  Compromised airway. - full vent support - adjust RR and Vt to avoid  PEEPi - f/u CXR, ABG - goal SpO2 > 92% - prn duoneb  Elevated troponin in setting of cocaine abuse. - f/u Echo, troponin, ECG - given neuro status will defer cardiology assessment for now  Lactic acidosis. - f/u lactic acid level  Fever, leukocytosis, RML infiltrate on CXR and u/a suggestive of UTI. - start rocephin, zithromax  AKI from hypovolemia. Hypokalemia. - baseline creatinine 0.87 from 10/21/20 - continue IV fluid - f/u BMET, monitor urine outpt  Best Practice (right click and "Reselect all SmartList Selections" daily)   Diet/type: tubefeeds DVT prophylaxis: SCD GI prophylaxis: PPI Lines: N/A Foley:  Yes, and it is still needed Code Status:  full code Last date of multidisciplinary goals of care discussion [x]   Labs   CBC: Recent Labs  Lab 06/17/22 0145  WBC 22.2*  NEUTROABS 19.9*  HGB 13.3  HCT 43.6  MCV 71.9*  PLT 432*    Basic Metabolic Panel: Recent Labs  Lab 06/17/22 0145  NA 139  K 3.2*  CL 103  CO2 22  GLUCOSE 118*  BUN 43*  CREATININE 2.03*  CALCIUM 9.6   GFR: CrCl cannot be calculated (Unknown ideal weight.). Recent Labs  Lab 06/17/22 0145 06/17/22 0256  WBC 22.2*  --   LATICACIDVEN  --  4.2*    Liver Function Tests: Recent Labs  Lab 06/17/22 0145  AST 29  ALT 14  ALKPHOS 78  BILITOT 1.5*  PROT 8.9*  ALBUMIN 3.8   Recent Labs  Lab 06/17/22 0145  LIPASE 26   No results for input(s): "AMMONIA" in the last 168 hours.  ABG    Component Value Date/Time   PHART 7.4 06/17/2022 0230   PCO2ART 35 06/17/2022 0230   PO2ART 78 (L) 06/17/2022 0230   HCO3 21.7 06/17/2022 0230   ACIDBASEDEF 2.5 (H) 06/17/2022 0230   O2SAT 96.1 06/17/2022 0230     Coagulation Profile: Recent Labs  Lab 06/17/22 0145  INR 1.3*    Cardiac Enzymes: No results for input(s): "CKTOTAL", "CKMB", "CKMBINDEX", "TROPONINI" in the last 168 hours.  HbA1C: Hgb A1c MFr Bld  Date/Time Value Ref Range Status  03/23/2016 10:23 AM 5.9 (H) 4.8 -  5.6 % Final    Comment:    (NOTE)         Pre-diabetes: 5.7 - 6.4         Diabetes: >6.4         Glycemic control for adults with diabetes: <7.0     CBG: Recent Labs  Lab 06/17/22 0713  GLUCAP 108*    Review of Systems:   Unable to obtain  Past Medical History:  She,  has no past medical history on file.   Surgical History:   Past Surgical History:  Procedure Laterality Date   CESAREAN SECTION       Social History:   reports that she has been smoking cigarettes. She has a 7.50 pack-year smoking history. She has never used smokeless tobacco. She reports current alcohol use. She reports current drug use. Frequency: 3.00 times per week. Drugs: Cocaine and "Crack" cocaine.   Family History:  Her family history includes Cancer in her mother.   Allergies No Known Allergies   Home Medications  Prior to Admission medications   Medication Sig Start Date End Date Taking? Authorizing Provider  albuterol (VENTOLIN HFA) 108 (90 Base) MCG/ACT inhaler Inhale 2 puffs into the lungs every 4 (four) hours as needed for wheezing or shortness of breath. 12/24/19   Menshew, Charlesetta Ivory, PA-C  hydrochlorothiazide (MICROZIDE) 12.5 MG capsule Take 1 capsule (12.5 mg total) by mouth daily. 12/24/19   Menshew, Charlesetta Ivory, PA-C     Critical care time: 42 minutes  Coralyn Helling, MD [facility] Pulmonary/Critical Care Pager - [phone] or [phone] 06/17/2022, 7:48 AM

## 2022-06-17 NOTE — Progress Notes (Addendum)
STROKE TEAM PROGRESS NOTE   INTERVAL HISTORY  Examined [name] at bedside. Nonverbal and intubated during encounter. She was brought in to [facility] unresponsive and subsequently intubated for low GCS. LKW was Wednesday AM. Noted to have some slurred speech Wednesday around 2PM. Was found to have a large R IPH with 5mm midline shift. UDS positive for cocaine use and she has history of such.   Also found to have an AKI, elevated lactate, and likely a UTI on labs. She is on rocephin and azithromycin for treatment and received some IVF for rehydration. She is currently on hypertonic saline 3% to help with cerebral edema.  Vitals:   06/17/22 1200 06/17/22 1300 06/17/22 1400 06/17/22 1403  BP: (!) 149/105 (!) 144/92 131/83   Pulse: 84 88 87   Resp: 19 (!) 21 (!) 21 20  Temp: 99.7 F (37.6 C) 99.5 F (37.5 C) 99.3 F (37.4 C)   SpO2: 98% 97% 96%   Weight:      Height:       CBC:  Recent Labs  Lab 06/17/22 0145 06/17/22 0922  WBC 22.2*  --   NEUTROABS 19.9*  --   HGB 13.3 11.2*  HCT 43.6 33.0*  MCV 71.9*  --   PLT 432*  --    Basic Metabolic Panel:  Recent Labs  Lab 06/17/22 0145 06/17/22 0759 06/17/22 0922  NA 139 144 149*  K 3.2* 3.2* 3.4*  CL 103 112*  --   CO2 22 21*  --   GLUCOSE 118* 106*  --   BUN 43* 39*  --   CREATININE 2.03* 2.15*  --   CALCIUM 9.6 8.6*  --   MG  --  2.0  --    Lipid Panel: No results for input(s): "CHOL", "TRIG", "HDL", "CHOLHDL", "VLDL", "LDLCALC" in the last 168 hours. HgbA1c: No results for input(s): "HGBA1C" in the last 168 hours. Urine Drug Screen:  Recent Labs  Lab 06/17/22 0206  LABOPIA NONE DETECTED  COCAINSCRNUR POSITIVE*  LABBENZ NONE DETECTED  AMPHETMU NONE DETECTED  THCU NONE DETECTED  LABBARB NONE DETECTED    Alcohol Level  Recent Labs  Lab 06/17/22 0145  ETH <10    IMAGING past 24 hours ECHOCARDIOGRAM COMPLETE  Result Date: 06/17/2022    ECHOCARDIOGRAM REPORT   [name] Name:   Rebecca [name] Date of Exam:  06/17/2022 Medical Rec #:  [id number]          Height:       65.0 in Accession #:    [id number]         Weight:       140.0 lb Date of Birth:  1975-06-29          BSA:          1.700 m [name] Age:    46 years           BP:           122/111 mmHg [name] Gender: F                  HR:           102 bpm. Exam Location:  Inpatient Procedure: 2D Echo, Cardiac Doppler and Color Doppler Indications:    Stroke  History:        [name] has no prior history of Echocardiogram examinations.                 ICH, sepsis; Risk Factors:Current Smoker and Substance abuse.  Sonographer:    Wallie Char Referring Phys: [id number] [facility]  Sonographer Comments: Technically challenging study due to limited acoustic windows and echo performed with [name] supine and on artificial respirator. IMPRESSIONS  1. Left ventricular ejection fraction, by estimation, is 50 to 55%. The left ventricle has low normal function. The left ventricle has no regional wall motion abnormalities. There is moderate left ventricular hypertrophy. Left ventricular diastolic parameters are consistent with Grade I diastolic dysfunction (impaired relaxation).  2. Right ventricular systolic function is normal. The right ventricular size is normal. There is mildly elevated pulmonary artery systolic pressure.  3. The mitral valve is normal in structure. Trivial mitral valve regurgitation. No evidence of mitral stenosis.  4. The aortic valve is tricuspid. Aortic valve regurgitation is not visualized. No aortic stenosis is present. FINDINGS  Left Ventricle: Left ventricular ejection fraction, by estimation, is 50 to 55%. The left ventricle has low normal function. The left ventricle has no regional wall motion abnormalities. The left ventricular internal cavity size was normal in size. There is moderate left ventricular hypertrophy. Left ventricular diastolic parameters are consistent with Grade I diastolic dysfunction (impaired relaxation). Right Ventricle: The  right ventricular size is normal. No increase in right ventricular wall thickness. Right ventricular systolic function is normal. There is mildly elevated pulmonary artery systolic pressure. The tricuspid regurgitant velocity is 2.74  m/s, and with an assumed right atrial pressure of 8 mmHg, the estimated right ventricular systolic pressure is 38.0 mmHg. Left Atrium: Left atrial size was normal in size. Right Atrium: Right atrial size was normal in size. Pericardium: There is no evidence of pericardial effusion. Mitral Valve: The mitral valve is normal in structure. Trivial mitral valve regurgitation. No evidence of mitral valve stenosis. MV peak gradient, 5.3 mmHg. The mean mitral valve gradient is 2.0 mmHg. Tricuspid Valve: The tricuspid valve is normal in structure. Tricuspid valve regurgitation is trivial. Aortic Valve: The aortic valve is tricuspid. Aortic valve regurgitation is not visualized. No aortic stenosis is present. Aortic valve mean gradient measures 4.0 mmHg. Aortic valve peak gradient measures 6.2 mmHg. Aortic valve area, by VTI measures 2.39 cm. Pulmonic Valve: The pulmonic valve was normal in structure. Pulmonic valve regurgitation is not visualized. Aorta: The aortic root and ascending aorta are structurally normal, with no evidence of dilitation. IAS/Shunts: The interatrial septum was not well visualized.  LEFT VENTRICLE PLAX 2D LVIDd:         4.50 cm     Diastology LVIDs:         3.30 cm     LV e' medial:    6.63 cm/s LV PW:         1.10 cm     LV E/e' medial:  11.0 LV IVS:        1.00 cm     LV e' lateral:   5.86 cm/s LVOT diam:     1.90 cm     LV E/e' lateral: 12.4 LV SV:         40 LV SV Index:   23 LVOT Area:     2.84 cm  LV Volumes (MOD) LV vol d, MOD A2C: 68.4 ml LV vol d, MOD A4C: 77.5 ml LV vol s, MOD A2C: 33.9 ml LV vol s, MOD A4C: 37.8 ml LV SV MOD A2C:     34.5 ml LV SV MOD A4C:     77.5 ml LV SV MOD BP:      39.2 ml RIGHT VENTRICLE  IVC RV S prime:     13.90 cm/s  IVC  diam: 1.50 cm TAPSE (M-mode): 1.6 cm LEFT ATRIUM             Index        RIGHT ATRIUM           Index LA diam:        3.00 cm 1.76 cm/m   RA Area:     10.40 cm LA Vol (A2C):   23.2 ml 13.65 ml/m  RA Volume:   20.40 ml  12.00 ml/m LA Vol (A4C):   19.2 ml 11.29 ml/m LA Biplane Vol: 21.2 ml 12.47 ml/m  AORTIC VALVE AV Area (Vmax):    2.85 cm AV Area (Vmean):   2.41 cm AV Area (VTI):     2.39 cm AV Vmax:           124.00 cm/s AV Vmean:          93.450 cm/s AV VTI:            0.166 m AV Peak Grad:      6.2 mmHg AV Mean Grad:      4.0 mmHg LVOT Vmax:         124.50 cm/s LVOT Vmean:        79.500 cm/s LVOT VTI:          0.140 m LVOT/AV VTI ratio: 0.84  AORTA Ao Root diam: 3.30 cm Ao Asc diam:  2.80 cm MITRAL VALVE               TRICUSPID VALVE MV Area (PHT): 4.06 cm    TR Peak grad:   30.0 mmHg MV Area VTI:   1.72 cm    TR Vmax:        274.00 cm/s MV Peak grad:  5.3 mmHg MV Mean grad:  2.0 mmHg    SHUNTS MV Vmax:       1.15 m/s    Systemic VTI:  0.14 m MV Vmean:      68.2 cm/s   Systemic Diam: 1.90 cm MV Decel Time: 187 msec MV E velocity: 72.90 cm/s MV A velocity: 89.60 cm/s MV E/A ratio:  0.81 Epifanio Lesches MD Electronically signed by Epifanio Lesches MD Signature Date/Time: 06/17/2022/10:47:05 AM    Final    CT VENOGRAM HEAD  Result Date: 06/17/2022 CLINICAL DATA:  Intracranial hemorrhage. EXAM: CT VENOGRAM HEAD TECHNIQUE: Venographic phase images of the brain were obtained following the administration of intravenous contrast. Multiplanar reformats and maximum intensity projections were generated. RADIATION DOSE REDUCTION: Rebecca exam was performed according to the departmental dose-optimization program which includes automated exposure control, adjustment of the mA and/or kV according to [name] size and/or use of iterative reconstruction technique. CONTRAST:  75mL OMNIPAQUE IOHEXOL 350 MG/ML SOLN COMPARISON:  None Available. FINDINGS: Filling defect of the right transverse sinus and right  transverse sigmoid junction which are somewhat large and tubular for arachnoid granulations, although an arachnoid granulation at the left transverse sigmoid junction appears nearly similar. The location would also not explain the acute hemorrhage. No visible cortical or central venous thrombosis. IMPRESSION: No cortical or central venous thrombosis to explain the ICH. There are filling defects at the right transverse and transverse sigmoid dural sinuses which are prominent for arachnoid granulations but not definitive for thrombus. Attention at follow-up imaging. Electronically Signed   By: Tiburcio Pea M.D.   On: 06/17/2022 05:30   CT ANGIO HEAD NECK W WO CM  Result Date:  06/17/2022 CLINICAL DATA:  Found unresponsive. Intracranial hemorrhage. History of cocaine use EXAM: CT ANGIOGRAPHY HEAD AND NECK WITH AND WITHOUT CONTRAST TECHNIQUE: Multidetector CT imaging of the head and neck was performed using the standard protocol during bolus administration of intravenous contrast. Multiplanar CT image reconstructions and MIPs were obtained to evaluate the vascular anatomy. Carotid stenosis measurements (when applicable) are obtained utilizing NASCET criteria, using the distal internal carotid diameter as the denominator. RADIATION DOSE REDUCTION: Rebecca exam was performed according to the departmental dose-optimization program which includes automated exposure control, adjustment of the mA and/or kV according to [name] size and/or use of iterative reconstruction technique. CONTRAST:  75mL OMNIPAQUE IOHEXOL 350 MG/ML SOLN COMPARISON:  Head CT from earlier today FINDINGS: CTA NECK FINDINGS Aortic arch: Unremarkable Right carotid system: Mixed density plaque at the bifurcation with some low-density plaque bulging into the ICA bulb. No ulceration or flow limiting stenosis Left carotid system: Mainly low-density plaque at the bifurcation without stenosis or ulceration. Vertebral arteries: No proximal subclavian  stenosis. The vertebral arteries are somewhat tortuous but smoothly contoured and diffusely patent Skeleton: No acute or aggressive finding. Other neck: No acute finding Upper chest: Mild opacification of dependent lungs which could be atelectasis. Centrilobular emphysema. Review of the MIP images confirms the above findings CTA HEAD FINDINGS Anterior circulation: No aneurysm or spot sign seen underlying the ICH. There is a aneurysm projecting leftward from the left carotid terminus which measures 4 mm. No adjacent hemorrhage. No major branch occlusion. There is undulation of the bilateral intracranial branches which is likely atheromatous given findings in the neck. No segmental beading. Some less intense flow and right MCA branches is likely related to mass effect by the ICH. Posterior circulation: The vertebral and basilar arteries arediffusely patent. Undulation of the left more than right PCA with up to moderate narrowing on the left, likely atheromatous given the findings in the neck. Venous sinuses: Reference dedicated CT venogram Anatomic variants: No acute finding Review of the MIP images confirms the above findings IMPRESSION: 1. No vascular lesion or spot sign seen at the acute ICH. 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 3. 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 Electronically Signed   By: Tiburcio Pea M.D.   On: 06/17/2022 05:18   CT Head Wo Contrast  Result Date: 06/17/2022 CLINICAL DATA:  Altered mental status EXAM: CT HEAD WITHOUT CONTRAST TECHNIQUE: Contiguous axial images were obtained from the base of the skull through the vertex without intravenous contrast. RADIATION DOSE REDUCTION: Rebecca exam was performed according to the departmental dose-optimization program which includes automated exposure control, adjustment of the mA and/or kV according to [name] size and/or use of iterative reconstruction technique. COMPARISON:  None Available. FINDINGS: Brain:  There is a large area of parenchymal hemorrhage identified in the right cerebral hemisphere centered primarily within the basal ganglia and extending superiorly into the centrum semi ovale. Rebecca measures approximately 4.7 x 4.0 cm in greatest AP and transverse dimensions respectively. It extends for approximately 4.7 cm in craniocaudad dimension. Significant mass-effect upon the right lateral ventricle is noted. Mild midline shift of approximately 5 mm is noted. Some surrounding edema is seen as well. No other hemorrhage is noted. Relative loss of the sulcal markings is noted likely related to some increased intracranial pressure. Vascular: No hyperdense vessel or unexpected calcification. Skull: Normal. Negative for fracture or focal lesion. Sinuses/Orbits: No acute finding. Other: None. IMPRESSION: Right-sided intraparenchymal hemorrhage as described with associated edema and mild midline shift of 5  mm from right to left. Critical Value/emergent results were called by telephone at the time of interpretation on 06/17/2022 at 2:27 am to Dr. Donna Bernard , who verbally acknowledged these results. Electronically Signed   By: Alcide Clever M.D.   On: 06/17/2022 02:29   DG Abdomen 1 View  Result Date: 06/17/2022 CLINICAL DATA:  Check gastric catheter placement EXAM: ABDOMEN - 1 VIEW COMPARISON:  None Available. FINDINGS: Gastric catheter is noted extending into the stomach. Proximal side port lies at the gastroesophageal junction. Rebecca should be advanced further into the stomach. No free air is seen. No bony abnormality is noted. IMPRESSION: Gastric catheter as described. Electronically Signed   By: Alcide Clever M.D.   On: 06/17/2022 02:16   DG Chest Port 1 View  Result Date: 06/17/2022 CLINICAL DATA:  Check endotracheal tube placement EXAM: PORTABLE CHEST 1 VIEW COMPARISON:  10/21/2020 FINDINGS: Cardiac shadow is within normal limits. Endotracheal tube is noted 1.5 cm above the carina. Gastric catheter is noted  extending into the stomach. Patchy increased density is noted in the right middle lobe beneath the minor fissure consistent with acute infiltrate. No sizable effusion is noted. No bony abnormality is seen. IMPRESSION: Right middle lobe infiltrate. Tubes and lines as described. Electronically Signed   By: Alcide Clever M.D.   On: 06/17/2022 02:16    PHYSICAL EXAM General: critically ill-appearing middle aged AA female, laying in bed intubated, NAD. Head: [place]/AT. CV: normal rate and regular rhythm. Pulm: intubated, on mechanical ventilation, synchronous with ventilator. Skin: warm and dry. Psych: sedated.  Neuro: Exam is limited given [name] is sedated on mechanical ventilation. Mental Status: unable to assess.  Cranial Nerves: II: pinpoint pupils bilaterally, sluggishly reactive to light. III, IV, VI: R eye exotropia. V: unable to assess. VII: no obvious facial droop VIII: unable to assess IX, X: unable to assess XII: unable to assess.  Motor: does not move any extremity spontaneously. Withdraws R side of body to centrally applied noxious stimuli. Withdraws RLE to peripheral noxious stimuli. L hemiplegia.  Sensation: unable to assess. Coordination: unable to assess. Gait: unable to assess.   ASSESSMENT/PLAN Rebecca [name] is a 47 y.o. female with history of cocaine use disorder presenting with unresponsiveness, found to have large R IPH with mass effect and 5mm midline shift.  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 CT venogram with no cortical or central venous thrombosis.  MRI   pending 2D Echo LVEF 50-55%, no RWMA, moderate LVH, G1DD, mildly elevated PASP, normal LA size. LDL No results found for requested labs within last 1095 days. HgbA1c No results found for requested labs within last 1095 days. 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 cocaine use disorder Home meds:  none Stable Goal SBP 130-150 for first  24 hours, then goal SBP <160 PRN IV labetalol and IV hydralazine Long-term BP goal normotensive Counsel on cessation of cocaine use once awake  Acute hypoxic respiratory failure 2/2 large R IPH -PCCM following, appreciate assistance -on ventilator  AKI - possibly from dehydration or may be developing ATN from hypoperfusion -baseline appears to be around 0.87, on admission Cr 2.03 -received IVF bolus earlier -trend kidney function, avoid nephrotoxic agents as able -CK normal, likely not rhabdo -consider nephrology consult if not improved by tomorrow   Fever, leukocytosis RML infiltrate on CXR UTI -on azithromycin and rocephin for CAP coverage -urine culture pending  Lactic acidosis Likely from brain hemorrhage and/or infection. Received IVF earlier. -trend lactate until clears  Hospital day # 0  STROKE MD NOTE :  I have personally obtained history,examined Rebecca [name], reviewed notes, independently viewed imaging studies, participated in medical decision making and plan of care.ROS completed by me personally and pertinent positives fully documented  I have made any additions or clarifications directly to the above note.  Agree with note above.  [name] presented with altered mental status due to right basal ganglia hemorrhage with cytotoxic edema and midline shift due to cocaine related vasculopathy and hypertension.  Neurological exam is quite poor she also has likely UTI and possible aspiration pneumonia.  No family available at the bedside for discussion.  Neurological exam limited by sedation and intubation.  Continue close neurological monitoring and strict blood pressure control with systolic goal 130-150 for the first 24 hours and then below 160.  Hypertonic saline at 75 cc an hour with serum sodium goal 150-155.  DVT and GI prophylaxis. No family available at the bedside for discussion.  Discussed with critical care team. Rebecca [name] is critically ill and at significant risk of neurological worsening, death and care requires constant monitoring of vital signs, hemodynamics,respiratory and cardiac monitoring, extensive review of multiple databases, frequent neurological assessment, discussion with family, other specialists and medical decision making of high complexity.I have made any additions or clarifications directly to the above note.Rebecca critical care time does not reflect procedure time, or teaching time or supervisory time of PA/NP/Med Resident etc but could involve care discussion time.  I spent 30 minutes of neurocritical care time  in the care of  Rebecca [name].      Delia Heady, MD Medical Director [facility] Pager: [phone] 06/17/2022 5:05 PM  To contact Stroke Continuity provider, please refer to [email or web address]. After hours, contact General Neurology

## 2022-06-17 NOTE — Progress Notes (Signed)
Pt back in ER from CT, transport on doc vent settings w/o incident.

## 2022-06-17 NOTE — ED Notes (Signed)
This Clinical research associate reviewed EMTALA documentation.  All fields completed and necessary signatures obtained.  Pt ready to be transported to [facility] via [facility] at this time.

## 2022-06-17 NOTE — Consult Note (Signed)
Case discussed with Dr. Vicente Males.  Patient unresponsive for unknown length of time.  Found to have ~44 ml R F IPH involving basal ganglia.    - Recommend discussion with neurology on call for likely xfer to higher level of care - Would not recommend surgical evacuation of clot given deep location and unknown duration of symptoms per report.

## 2022-06-17 NOTE — H&P (Signed)
NEUROLOGY CONSULTATION NOTE   Date of service: Jun 17, 2022 Patient Name: Rebecca [name] MRN:  [id number] DOB:  08-Mar-1975  _ _ _   _ __   _ __ _ _  __ __   _ __   __ _  History of Present Illness  Rebecca [name] is a 47 y.o. female with PMH significant for cocaine use, who essentially was brought in to [facility] unresponsive and intubated for low GCS. LKW Wednesday AM and noted to have some slurred speech Wednesday around 2pm and has not been seen since. EMS brought her in unresponsive. She got intubated and found to have a large right intraparenchymal hemorrhage with 5mm midline shift. UDS is positive for cocaine.  She is intubated and unresponsive and unable to provide any history.  LKW: 06/15/22. mRS: 0 tNKASE: not offered 2/2 ICH Thrombectomy: not offered 2/2 ICH ICH Score: 2(GCS) NIHSS components Score: Comment  1a Level of Conscious 0[]  1[]  2[]  3[x]      1b LOC Questions 0[]  1[]  2[x]       1c LOC Commands 0[]  1[]  2[x]       2 Best Gaze 0[x]  1[]  2[]       3 Visual 0[]  1[]  2[x]  3[]      4 Facial Palsy 0[x]  1[]  2[]  3[]      5a Motor Arm - left 0[]  1[]  2[]  3[x]  4[]  UN[]    5b Motor Arm - Right 0[]  1[]  2[]  3[x]  4[]  UN[]    6a Motor Leg - Left 0[]  1[]  2[]  3[x]  4[]  UN[]    6b Motor Leg - Right 0[]  1[]  2[]  3[x]  4[]  UN[]    7 Limb Ataxia 0[x]  1[]  2[]  3[]  UN[]     8 Sensory 0[x]  1[]  2[]  UN[]      9 Best Language 0[]  1[]  2[]  3[x]      10 Dysarthria 0[]  1[]  2[x]  UN[]      11 Extinct. and Inattention 0[x]  1[]  2[]       TOTAL: 26       ROS   Unable to obtain 2/2 intubation and sedation.  Past History  No past medical history on file. Past Surgical History:  Procedure Laterality Date   CESAREAN SECTION     Family History  Problem Relation Age of Onset   Cancer Mother    Social History   Socioeconomic History   Marital status: Single    Spouse name: Not on file   Number of children: Not on file   Years of education: Not on file   Highest education level: Not on file  Occupational  History   Not on file  Tobacco Use   Smoking status: Every Day    Packs/day: 0.50    Years: 15.00    Additional pack years: 0.00    Total pack years: 7.50    Types: Cigarettes   Smokeless tobacco: Never  Substance and Sexual Activity   Alcohol use: Yes    Comment: Bottle of wine/week   Drug use: Yes    Frequency: 3.0 times per week    Types: Cocaine, "Crack" cocaine   Sexual activity: Not on file  Other Topics Concern   Not on file  Social History Narrative   Not on file   Social Determinants of Health   Financial Resource Strain: Not on file  Food Insecurity: Not on file  Transportation Needs: Not on file  Physical Activity: Not on file  Stress: Not on file  Social Connections: Not on file   No Known Allergies  Medications   Medications  Prior to Admission  Medication Sig Dispense Refill Last Dose   albuterol (VENTOLIN HFA) 108 (90 Base) MCG/ACT inhaler Inhale 2 puffs into the lungs every 4 (four) hours as needed for wheezing or shortness of breath. 6.7 g 1    hydrochlorothiazide (MICROZIDE) 12.5 MG capsule Take 1 capsule (12.5 mg total) by mouth daily. 30 capsule 3      Vitals   There were no vitals filed for this visit.   There is no height or weight on file to calculate BMI.  Physical Exam   General: Laying comfortably in bed; intubated, does not seem uncomfortable. HENT: Normal oropharynx and mucosa. Normal external appearance of ears and nose. Neck: Supple, no pain or tenderness  CV: No JVD. No peripheral edema.  Pulmonary: Symmetric Chest rise. Breathing over vent Abdomen: Soft to touch, non-tender.  Ext: No cyanosis, edema, or deformity  Skin: No rash. Normal palpation of skin.   Musculoskeletal: Normal digits and nails by inspection. No clubbing.   Neurologic Examination  Mental status/Cognition: comatose, grimaces to noxious stimuli. No response to loud voice or tactile stimulation. Speech/language: mute, no speech, no attempts to  communicate. Cranial nerves:   CN II Pupils equal and reactive to light.   CN III,IV,VI EOM intact to dolls eyes   CN V Corneals intact BL   CN VII Symmetric frowning to noxious stimuli   CN VIII Does not turn head towards speech   CN IX & X Cough and gag intact.   CN XI Head midline.   CN XII    Sensory/Motor:  Muscle bulk: normal, tone flaccid in all extremities. Minimal movements, probably withdrawal to repeated noxious stimuli in BL upper extremities. Triple flexion in LLE Vigorous withdrawal to babinski/noxious stimuli in RLE.  Coordination/Complex Motor:  Unable to assess.  Labs   CBC:  Recent Labs  Lab 06/17/22 0145  WBC 22.2*  NEUTROABS 19.9*  HGB 13.3  HCT 43.6  MCV 71.9*  PLT 432*    Basic Metabolic Panel:  Lab Results  Component Value Date   NA 139 06/17/2022   K 3.2 (L) 06/17/2022   CO2 22 06/17/2022   GLUCOSE 118 (H) 06/17/2022   BUN 43 (H) 06/17/2022   CREATININE 2.03 (H) 06/17/2022   CALCIUM 9.6 06/17/2022   GFRNONAA 30 (L) 06/17/2022   GFRAA >60 05/12/2017   Lipid Panel: No results found for: "LDLCALC" HgbA1c:  Lab Results  Component Value Date   HGBA1C 5.9 (H) 03/23/2016   Urine Drug Screen:     Component Value Date/Time   LABOPIA NONE DETECTED 06/17/2022 0206   COCAINSCRNUR POSITIVE (A) 06/17/2022 0206   LABBENZ NONE DETECTED 06/17/2022 0206   AMPHETMU NONE DETECTED 06/17/2022 0206   THCU NONE DETECTED 06/17/2022 0206   LABBARB NONE DETECTED 06/17/2022 0206    Alcohol Level     Component Value Date/Time   ETH <10 06/17/2022 0145    CT Head without contrast(Personally reviewed): Right-sided intraparenchymal hemorrhage as described with associated edema and mild midline shift of 5 mm from right to left.  CT angio Head and Neck with contrast(Personally reviewed): No LVO. Notable for 4mm carotid terminus aneurysm but unlikely for this to explain R sided ICH  CT Venogram: No dural venous sinus thrombosis.  MRI  Brain(Personally reviewed): pending  cEEG:  pending  Impression   Rebecca [name] is a 47 y.o. female with hx of cocaine use p/w unresponsive and large R IPH with brain compression and 5mm midline shift.  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 No aneurysm, no AVM, no dural venous sinus thrombosis noted on imaging.  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 (No contrast 2/2 AKI) - CTA and CTV with no aneurysm, no AVM, no CVST. - Risk factor modification - Echocardiogram - PT consult, OT consult, Speech consult. - Stroke team to follow - cEEG with MRI compatible leads given obtunded mentation. - hypertonic saline 75cc/hr due to proximity of the ICH to uncus. Goal sodium of 150-155.   Acute hypoxic respiratory failure due to above: - on vent. PCCM consulted.  AKI: possibly from dehydration, could be from prolonged downtime or potential rhabdo. - CK - gentle fluids  History of cocaine use: - will need to counsel when awake.  CODE STATUS: presumed full code as she is unresponsive and no family at bedside. Unable to verify allergies.  This patient is critically ill and at significant risk of neurological worsening, death and care requires constant monitoring of vital signs, hemodynamics,respiratory and cardiac monitoring, neurological assessment, discussion with family, other specialists and medical decision making of  high complexity. I spent 60 minutes of neurocritical care time  in the care of  this patient. This was time spent independent of any time provided by nurse practitioner or PA.  Erick Blinks [facility] Pager Number [phone] 06/17/2022  7:00 AM  ______________________________________________________________________   Thank you for the opportunity to take part in the care of this patient. If you have any further questions, please contact the neurology consultation attending.  Signed,  Erick Blinks [facility] Pager Number [phone] _ _ _   _ __   _ __ _ _  __ __   _ __   __ _

## 2022-06-18 ENCOUNTER — Inpatient Hospital Stay (HOSPITAL_COMMUNITY): Payer: Medicaid Other

## 2022-06-18 DIAGNOSIS — I953 Hypotension of hemodialysis: Secondary | ICD-10-CM

## 2022-06-18 DIAGNOSIS — R569 Unspecified convulsions: Secondary | ICD-10-CM

## 2022-06-18 DIAGNOSIS — F1410 Cocaine abuse, uncomplicated: Secondary | ICD-10-CM | POA: Diagnosis not present

## 2022-06-18 DIAGNOSIS — I610 Nontraumatic intracerebral hemorrhage in hemisphere, subcortical: Secondary | ICD-10-CM | POA: Diagnosis not present

## 2022-06-18 LAB — CBC
HCT: 32.2 % — ABNORMAL LOW (ref 36.0–46.0)
Hemoglobin: 9.3 g/dL — ABNORMAL LOW (ref 12.0–15.0)
MCH: 21.9 pg — ABNORMAL LOW (ref 26.0–34.0)
MCHC: 28.9 g/dL — ABNORMAL LOW (ref 30.0–36.0)
MCV: 75.8 fL — ABNORMAL LOW (ref 80.0–100.0)
Platelets: 284 10*3/uL (ref 150–400)
RBC: 4.25 MIL/uL (ref 3.87–5.11)
RDW: 20 % — ABNORMAL HIGH (ref 11.5–15.5)
WBC: 20.1 10*3/uL — ABNORMAL HIGH (ref 4.0–10.5)
nRBC: 0 % (ref 0.0–0.2)

## 2022-06-18 LAB — BASIC METABOLIC PANEL
Anion gap: 8 (ref 5–15)
BUN: 39 mg/dL — ABNORMAL HIGH (ref 6–20)
BUN: 38 mg/dL — ABNORMAL HIGH (ref 6–20)
BUN: 32 mg/dL — ABNORMAL HIGH (ref 6–20)
CO2: 20 mmol/L — ABNORMAL LOW (ref 22–32)
CO2: 17 mmol/L — ABNORMAL LOW (ref 22–32)
CO2: 20 mmol/L — ABNORMAL LOW (ref 22–32)
Calcium: 8.8 mg/dL — ABNORMAL LOW (ref 8.9–10.3)
Calcium: 9.2 mg/dL (ref 8.9–10.3)
Calcium: 8.7 mg/dL — ABNORMAL LOW (ref 8.9–10.3)
Chloride: >130 mmol/L (ref 98–111)
Chloride: >130 mmol/L (ref 98–111)
Chloride: 129 mmol/L — ABNORMAL HIGH (ref 98–111)
Creatinine, Ser: 1.13 mg/dL — ABNORMAL HIGH (ref 0.44–1.00)
Creatinine, Ser: 1.16 mg/dL — ABNORMAL HIGH (ref 0.44–1.00)
Creatinine, Ser: 1.03 mg/dL — ABNORMAL HIGH (ref 0.44–1.00)
GFR, Estimated: >60 mL/min (ref >60)
GFR, Estimated: 59 mL/min — ABNORMAL LOW (ref >60)
GFR, Estimated: >60 mL/min (ref >60)
Glucose, Bld: 104 mg/dL — ABNORMAL HIGH (ref 70–99)
Glucose, Bld: 125 mg/dL — ABNORMAL HIGH (ref 70–99)
Glucose, Bld: 132 mg/dL — ABNORMAL HIGH (ref 70–99)
Potassium: 4.2 mmol/L (ref 3.5–5.1)
Potassium: 4.9 mmol/L (ref 3.5–5.1)
Potassium: 4 mmol/L (ref 3.5–5.1)
Sodium: 157 mmol/L — ABNORMAL HIGH (ref 135–145)
Sodium: 157 mmol/L — ABNORMAL HIGH (ref 135–145)
Sodium: 157 mmol/L — ABNORMAL HIGH (ref 135–145)

## 2022-06-18 LAB — SODIUM
Sodium: 159 mmol/L — ABNORMAL HIGH (ref 135–145)
Sodium: 158 mmol/L — ABNORMAL HIGH (ref 135–145)

## 2022-06-18 LAB — URINE CULTURE: Culture: >=100000 — AB

## 2022-06-18 LAB — HEMOGLOBIN A1C
Hgb A1c MFr Bld: 5.9 % — ABNORMAL HIGH (ref 4.8–5.6)
Mean Plasma Glucose: 122.63 mg/dL

## 2022-06-18 LAB — GLUCOSE, CAPILLARY
Glucose-Capillary: 103 mg/dL — ABNORMAL HIGH (ref 70–99)
Glucose-Capillary: 105 mg/dL — ABNORMAL HIGH (ref 70–99)
Glucose-Capillary: 106 mg/dL — ABNORMAL HIGH (ref 70–99)
Glucose-Capillary: 101 mg/dL — ABNORMAL HIGH (ref 70–99)

## 2022-06-18 LAB — PHOSPHORUS: Phosphorus: 2.6 mg/dL (ref 2.5–4.6)

## 2022-06-18 LAB — LIPID PANEL
Cholesterol: 126 mg/dL (ref 0–200)
HDL: 45 mg/dL (ref >40)
LDL Cholesterol: 65 mg/dL (ref 0–99)
Total CHOL/HDL Ratio: 2.8 RATIO
Triglycerides: 78 mg/dL (ref <150)
VLDL: 16 mg/dL (ref 0–40)

## 2022-06-18 LAB — MAGNESIUM: Magnesium: 2.3 mg/dL (ref 1.7–2.4)

## 2022-06-18 MED ORDER — FREE WATER
200.0000 mL | Freq: Four times a day (QID) | Status: DC
  Administered 2022-06-19: 200 mL

## 2022-06-18 MED ORDER — LABETALOL HCL 5 MG/ML IV SOLN
10.0000 mg | INTRAVENOUS | Status: DC | PRN
  Administered 2022-06-19: 10 mg via INTRAVENOUS
  Administered 2022-06-19 (×2): 20 mg via INTRAVENOUS
  Filled 2022-06-18 (×2): qty 4

## 2022-06-18 MED ORDER — MIDAZOLAM HCL 2 MG/2ML IJ SOLN
INTRAMUSCULAR | Status: AC
  Administered 2022-06-18: 2 mg
  Filled 2022-06-18: qty 2

## 2022-06-18 MED ORDER — ACETAMINOPHEN 10 MG/ML IV SOLN
1000.0000 mg | Freq: Four times a day (QID) | INTRAVENOUS | Status: AC
  Administered 2022-06-18: 1000 mg via INTRAVENOUS
  Filled 2022-06-18 (×2): qty 100

## 2022-06-18 MED ORDER — HYDRALAZINE HCL 20 MG/ML IJ SOLN
20.0000 mg | INTRAMUSCULAR | Status: DC | PRN
  Administered 2022-06-19 – 2022-06-29 (×8): 20 mg via INTRAVENOUS
  Filled 2022-06-18 (×10): qty 1

## 2022-06-18 MED ORDER — CLEVIDIPINE BUTYRATE 0.5 MG/ML IV EMUL
0.0000 mg/h | INTRAVENOUS | Status: DC
  Filled 2022-06-18: qty 100

## 2022-06-18 NOTE — Progress Notes (Signed)
SLP Cancellation Note  Patient Details Name: Rebecca [name] MRN: [id number] DOB: 07/28/1975   Cancelled treatment:       Reason Eval/Treat Not Completed: Medical issues which prohibited therapy (Patient remains intubated. Signing off. Please reconsult when appropriate.)  Ferdinand Lango MA, CCC-SLP  [name] Meryl 06/18/2022, 8:48 AM

## 2022-06-18 NOTE — Progress Notes (Signed)
NAME:  Rebecca [name], MRN:  [id number], DOB:  [birth year], LOS: 1 ADMISSION DATE:  06/17/2022, CONSULTATION DATE:  06/18/22 REFERRING MD:  EDP, CHIEF COMPLAINT:  unresponsive   History of Present Illness:  47 yo female with hx of polysubstance abuse presented brought to [facility] ED with AMS.  Intubated for airway protection.  CT head showed large Rt ICH with 5 mm midline shift.  UDS positive for cocaine.  Transferred to [facility] for further management.  PCCM consulted to assist with management in ICU.   Hx from chart and medical team.  Pertinent  Medical History  Substance abuse  Significant Hospital Events: Including procedures, antibiotic start and stop dates in addition to other pertinent events   5/10 Admit, neurosurgery consulted 5/11 remains on ventilator with hypertonic saline and EEG in place  Interim History / Subjective:  Well Flicker eyes open to verbal stimuli, unable to follow commands, has cough and gag  Objective   Blood pressure 123/74, pulse 63, temperature 100.2 F (37.9 C), resp. rate 17, height 5\' 5"  (1.651 m), weight 60.5 kg, SpO2 98 %.    Vent Mode: PRVC FiO2 (%):  [50 %-60 %] 50 % Set Rate:  [18 bmp] 18 bmp Vt Set:  [480 mL] 480 mL PEEP:  [5 cmH20] 5 cmH20 Plateau Pressure:  [12 cmH20-17 cmH20] 17 cmH20   Intake/Output Summary (Last 24 hours) at 06/18/2022 0750 Last data filed at 06/18/2022 0600 Gross per 24 hour  Intake 1790.56 ml  Output 505 ml  Net 1285.56 ml    Filed Weights   06/17/22 0800 06/18/22 0305  Weight: 60.2 kg 60.5 kg   Physical exam General: Acute ill-appearing adult female lying in bed in no acute distress HEENT: ETT, MM pink/moist, PERRL,  Neuro: Flickers eyes open to verbal stimuli, disconjugate gaze, pupils pinpoint bilaterally, cough and gag present CV: s1s2 regular rate and rhythm, no murmur, rubs, or gallops,  PULM: Clear to auscultation, diminished bases, no increased work of breathing, tolerating ventilator GI: soft, bowel  sounds active in all 4 quadrants, non-tender, non-distended, tolerating TF Extremities: warm/dry, no edema  Skin: no rashes or lesions  Resolved Hospital Problem list     Assessment & Plan:   Right basal ganglia hemorrhage with mass effect and brain compression with mid-line shift -Likely secondary to cocaine induced hypertension P: Primary management per neurology  Further imaging per neurology MRI brain Maintain neuro protective measures; goal for eurothermia, euglycemia, eunatermia, normoxia, and PCO2 goal of 35-40 Nutrition and bowel regiment  Seizure precautions  AEDs per neurology  Aspirations precautions  Remains on hypertonic saline per neurology Continue stroke prevention LTM per neurology  Respiratory insufficiency in the setting of large IPH -Intubated on ED arrival RML infiltrate on admission chest x-ray -Concern for CAP versus aspiration pneumonia P: Continue ventilator support with lung protective strategies  Wean PEEP and FiO2 for sats greater than 90%. Head of bed elevated 30 degrees. Plateau pressures less than 30 cm H20.  Follow intermittent chest x-ray and ABG.   SAT/SBT as tolerated, mentation preclude extubation  Ensure adequate pulmonary hygiene  Follow cultures  VAP bundle in place  PAD protocol As needed bronchodilators Continue empiric ceftriaxone and azithromycin  New HFrEF -Echocardiogram completed 06/16/2022 with EF 50 to 55%, moderate left ventricular hypertrophy and grade 1 diastolic dysfunction Elevated troponin in setting of cocaine abuse -High-sensitivity troponin peaked at 708 P: Continuous telemetry Strict intake and output Strict intake and output  Daily weight to assess volume status Daily  assessment for need to diurese Closely monitor renal function and electrolytes   Urinary tract infection present on admission -Urinalysis with many bacteria, moderate leukocytes, but negative nitrates.  Due to poor mental status and current  intubation we will to determine if patient is symptomatic P: Continue empiric ceftriaxone and azithromycin as above  AKI from hypovolemia -Baseline creatinine 0.87 from 10/21/20, creatinine on admission 2.03 GFR 30 Hypokalemia P: Follow renal function  Monitor urine output Trend Bmet Avoid nephrotoxins Ensure adequate renal perfusion   Anemia P: Transfuse per protocol Hemoglobin goal greater than 7 Monitor signs of bleeding  Best Practice:   Diet/type: tubefeeds DVT prophylaxis: SCD GI prophylaxis: PPI Lines: N/A Foley:  Yes, and it is still needed Code Status:  full code Last date of multidisciplinary goals of care discussion: Patient sedated on ventilator, unable to engage in goals of care discussion.  Await family arrival  Critical care time:   CRITICAL CARE Performed by: Taima Rada D. Harris  Total critical care time: 40 minutes  Critical care time was exclusive of separately billable procedures and treating other patients.  Critical care was necessary to treat or prevent imminent or life-threatening deterioration.  Critical care was time spent personally by me on the following activities: development of treatment plan with patient and/or surrogate as well as nursing, discussions with consultants, evaluation of patient's response to treatment, examination of patient, obtaining history from patient or surrogate, ordering and performing treatments and interventions, ordering and review of laboratory studies, ordering and review of radiographic studies, pulse oximetry and re-evaluation of patient's condition.  Sampson Self D. Harris, NP-C [facility] Personal contact information can be found on Amion  If no contact or response made please call [phone] 06/18/2022, 8:05 AM

## 2022-06-18 NOTE — Progress Notes (Signed)
eLink Physician-Brief Progress Note Patient Name: Rebecca [name] DOB: 31-Oct-1975 MRN: [id number]   Date of Service  06/18/2022  HPI/Events of Note  Can you confirm OGT placement? ABD xray complete after advancing tube.  Feeding tube side port still in the GE junction Bedside RN states this tube has been advanced several times  On review of CXR this morning, feeding tube is coiled at the area of the thoracic inlet  eICU Interventions  Discussed with bedside and plan is to just re-insert a new feeding tube since present tube will likely continue to coil as its being pushed in KUB ordered to confirm placement of new tube     Intervention Category Intermediate Interventions: Diagnostic test evaluation  Darl Pikes 06/18/2022, 11:03 PM

## 2022-06-18 NOTE — Progress Notes (Addendum)
STROKE TEAM PROGRESS NOTE   SUBJECTIVE (INTERVAL HISTORY) Her RN is at the bedside.  Overall her condition is unchanged. Pt still intubated, slowly open eyes on voice, but not interactive or following commands. Still has left hemiplegia. Pending MRI.    OBJECTIVE Temp:  [98.6 F (37 C)-100.2 F (37.9 C)] 99.7 F (37.6 C) (05/11 0900) Pulse Rate:  [63-103] 76 (05/11 0900) Cardiac Rhythm: Normal sinus rhythm (05/11 0800) Resp:  [15-23] 17 (05/11 0900) BP: (109-162)/(62-133) 139/80 (05/11 0900) SpO2:  [95 %-100 %] 98 % (05/11 0900) FiO2 (%):  [50 %-60 %] 50 % (05/11 0800) Weight:  [60.5 kg] 60.5 kg (05/11 0305)  Recent Labs  Lab 06/17/22 1512 06/17/22 1905 06/17/22 2307 06/18/22 0255 06/18/22 0727  GLUCAP 95 96 106* 103* 105*   Recent Labs  Lab 06/17/22 0759 06/17/22 0922 06/17/22 2002 06/17/22 2310 06/18/22 0242 06/18/22 0616 06/18/22 0915  NA 144   < > 155* 157* 157* 157* 157*  K 3.2*   < > 4.6 4.6 4.2 4.9 4.0  CL 112*   < > 129* >130* >130* >130* 129*  CO2 21*   < > 19* 20* 20* 17* 20*  GLUCOSE 106*   < > 111* 116* 104* 125* 132*  BUN 39*   < > 37* 46* 39* 38* 32*  CREATININE 2.15*   < > 1.32* 1.26* 1.13* 1.16* 1.03*  CALCIUM 8.6*   < > 8.8* 8.7* 8.8* 9.2 8.7*  MG 2.0  --   --   --  2.3  --   --   PHOS  --   --   --   --  2.6  --   --    < > = values in this interval not displayed.   Recent Labs  Lab 06/17/22 0145  AST 29  ALT 14  ALKPHOS 78  BILITOT 1.5*  PROT 8.9*  ALBUMIN 3.8   Recent Labs  Lab 06/17/22 0145 06/17/22 0922 06/18/22 0242  WBC 22.2*  --  20.1*  NEUTROABS 19.9*  --   --   HGB 13.3 11.2* 9.3*  HCT 43.6 33.0* 32.2*  MCV 71.9*  --  75.8*  PLT 432*  --  284   Recent Labs  Lab 06/17/22 0759  CKTOTAL 67   Recent Labs    06/17/22 0145  LABPROT 16.6*  INR 1.3*   Recent Labs    06/17/22 0206  COLORURINE AMBER*  LABSPEC 1.020  PHURINE 5.0  GLUCOSEU NEGATIVE  HGBUR NEGATIVE  BILIRUBINUR NEGATIVE  KETONESUR NEGATIVE   PROTEINUR >=300*  NITRITE NEGATIVE  LEUKOCYTESUR MODERATE*       Component Value Date/Time   CHOL 126 06/18/2022 0242   TRIG 78 06/18/2022 0242   HDL 45 06/18/2022 0242   CHOLHDL 2.8 06/18/2022 0242   VLDL 16 06/18/2022 0242   LDLCALC 65 06/18/2022 0242   Lab Results  Component Value Date   HGBA1C 5.9 (H) 06/18/2022      Component Value Date/Time   LABOPIA NONE DETECTED 06/17/2022 0206   COCAINSCRNUR POSITIVE (A) 06/17/2022 0206   LABBENZ NONE DETECTED 06/17/2022 0206   AMPHETMU NONE DETECTED 06/17/2022 0206   THCU NONE DETECTED 06/17/2022 0206   LABBARB NONE DETECTED 06/17/2022 0206    Recent Labs  Lab 06/17/22 0145  ETH <10    I have personally reviewed the radiological images below and agree with the radiology interpretations.  Overnight EEG with video  Result Date: 06/18/2022 Charlsie Quest, MD  06/18/2022  8:04 AM Patient Name: Rebecca [name] MRN: [id number] Epilepsy Attending: Charlsie Quest Referring Physician/Provider: Erick Blinks, MD Duration: 06/17/2022 1215 to 06/18/2022 0730 Patient history: 47 y.o. female with hx of cocaine use p/w unresponsive and large R IPH with brain compression and 5mm midline shift. EEG to evaluate for seizure Level of alertness: Awake, asleep AEDs during EEG study: None Technical aspects: This EEG study was done with scalp electrodes positioned according to the 10-20 International system of electrode placement. Electrical activity was reviewed with band pass filter of 1-70Hz , sensitivity of 7 uV/mm, display speed of 64mm/sec with a 60Hz  notched filter applied as appropriate. EEG data were recorded continuously and digitally stored.  Video monitoring was available and reviewed as appropriate. Description: The posterior dominant rhythm consists of 7.5 Hz activity of moderate voltage (25-35 uV) seen predominantly in posterior head regions, symmetric and reactive to eye opening and eye closing. Sleep was characterized by vertex  waves, sleep spindles (12 to 14 Hz), maximal frontocentral region. EEG showed continuous 3 to 6 Hz theta-delta slowing in right hemisphere, maximal right temporal region. Intermittent generalized 3-6hz  theta-delta slowing was also noted. Hyperventilation and photic stimulation were not performed.   ABNORMALITY - Continuous slow, right hemisphere, maximal right temporal region - Intermittent slow, generalized IMPRESSION: This study is suggestive of cortical dysfunction arising from right hemisphere, maximal right temporal region likely secondary to underlying structural abnormality. Additionally there is mild to moderate diffuse encephalopathy. No seizures or epileptiform discharges were seen throughout the recording. Charlsie Quest   DG Chest Port 1 View  Result Date: 06/18/2022 CLINICAL DATA:  Pulmonary infiltrates EXAM: PORTABLE CHEST 1 VIEW COMPARISON:  06/17/2022 FINDINGS: Endotracheal tube is in stable position. NG tube tip in the proximal stomach with the side port near the GE junction. The NG tube appears to be buckled/looped within the neck near the thoracic inlet. Heart mediastinal contours within normal limits. Right middle lobe airspace opacity again noted, stable. No confluent opacity on the left. No effusions or acute bony abnormality. IMPRESSION: Continued right middle lobe infiltrate, unchanged. NG tube appears to be buckled slashed looped in the upper esophagus near the thoracic inlet. Tip is in the proximal stomach. Electronically Signed   By: Charlett Nose M.D.   On: 06/18/2022 07:11   DG Abd 1 View  Result Date: 06/17/2022 CLINICAL DATA:  Nasogastric tube placement. EXAM: ABDOMEN - 1 VIEW COMPARISON:  Radiograph earlier today. FINDINGS: Tip of the enteric tube is below the diaphragm in the stomach, the side port is just at the gastroesophageal junction. Normal upper abdominal bowel gas pattern. IMPRESSION: Tip of the enteric tube below the diaphragm in the stomach, side-port just at the  gastroesophageal junction. Electronically Signed   By: Narda Rutherford M.D.   On: 06/17/2022 15:43   ECHOCARDIOGRAM COMPLETE  Result Date: 06/17/2022    ECHOCARDIOGRAM REPORT   Patient Name:   Rebecca [name] Date of Exam: 06/17/2022 Medical Rec #:  [id number]          Height:       65.0 in Accession #:    [id number]         Weight:       140.0 lb Date of Birth:  November 20, 1975          BSA:          1.700 m Patient Age:    46 years           BP:  122/111 mmHg Patient Gender: F                  HR:           102 bpm. Exam Location:  Inpatient Procedure: 2D Echo, Cardiac Doppler and Color Doppler Indications:    Stroke  History:        Patient has no prior history of Echocardiogram examinations.                 ICH, sepsis; Risk Factors:Current Smoker and Substance abuse.  Sonographer:    Wallie Char Referring Phys: [id number] [facility]  Sonographer Comments: Technically challenging study due to limited acoustic windows and echo performed with patient supine and on artificial respirator. IMPRESSIONS  1. Left ventricular ejection fraction, by estimation, is 50 to 55%. The left ventricle has low normal function. The left ventricle has no regional wall motion abnormalities. There is moderate left ventricular hypertrophy. Left ventricular diastolic parameters are consistent with Grade I diastolic dysfunction (impaired relaxation).  2. Right ventricular systolic function is normal. The right ventricular size is normal. There is mildly elevated pulmonary artery systolic pressure.  3. The mitral valve is normal in structure. Trivial mitral valve regurgitation. No evidence of mitral stenosis.  4. The aortic valve is tricuspid. Aortic valve regurgitation is not visualized. No aortic stenosis is present. FINDINGS  Left Ventricle: Left ventricular ejection fraction, by estimation, is 50 to 55%. The left ventricle has low normal function. The left ventricle has no regional wall motion abnormalities. The left  ventricular internal cavity size was normal in size. There is moderate left ventricular hypertrophy. Left ventricular diastolic parameters are consistent with Grade I diastolic dysfunction (impaired relaxation). Right Ventricle: The right ventricular size is normal. No increase in right ventricular wall thickness. Right ventricular systolic function is normal. There is mildly elevated pulmonary artery systolic pressure. The tricuspid regurgitant velocity is 2.74  m/s, and with an assumed right atrial pressure of 8 mmHg, the estimated right ventricular systolic pressure is 38.0 mmHg. Left Atrium: Left atrial size was normal in size. Right Atrium: Right atrial size was normal in size. Pericardium: There is no evidence of pericardial effusion. Mitral Valve: The mitral valve is normal in structure. Trivial mitral valve regurgitation. No evidence of mitral valve stenosis. MV peak gradient, 5.3 mmHg. The mean mitral valve gradient is 2.0 mmHg. Tricuspid Valve: The tricuspid valve is normal in structure. Tricuspid valve regurgitation is trivial. Aortic Valve: The aortic valve is tricuspid. Aortic valve regurgitation is not visualized. No aortic stenosis is present. Aortic valve mean gradient measures 4.0 mmHg. Aortic valve peak gradient measures 6.2 mmHg. Aortic valve area, by VTI measures 2.39 cm. Pulmonic Valve: The pulmonic valve was normal in structure. Pulmonic valve regurgitation is not visualized. Aorta: The aortic root and ascending aorta are structurally normal, with no evidence of dilitation. IAS/Shunts: The interatrial septum was not well visualized.  LEFT VENTRICLE PLAX 2D LVIDd:         4.50 cm     Diastology LVIDs:         3.30 cm     LV e' medial:    6.63 cm/s LV PW:         1.10 cm     LV E/e' medial:  11.0 LV IVS:        1.00 cm     LV e' lateral:   5.86 cm/s LVOT diam:     1.90 cm     LV E/e' lateral:  12.4 LV SV:         40 LV SV Index:   23 LVOT Area:     2.84 cm  LV Volumes (MOD) LV vol d, MOD A2C:  68.4 ml LV vol d, MOD A4C: 77.5 ml LV vol s, MOD A2C: 33.9 ml LV vol s, MOD A4C: 37.8 ml LV SV MOD A2C:     34.5 ml LV SV MOD A4C:     77.5 ml LV SV MOD BP:      39.2 ml RIGHT VENTRICLE             IVC RV S prime:     13.90 cm/s  IVC diam: 1.50 cm TAPSE (M-mode): 1.6 cm LEFT ATRIUM             Index        RIGHT ATRIUM           Index LA diam:        3.00 cm 1.76 cm/m   RA Area:     10.40 cm LA Vol (A2C):   23.2 ml 13.65 ml/m  RA Volume:   20.40 ml  12.00 ml/m LA Vol (A4C):   19.2 ml 11.29 ml/m LA Biplane Vol: 21.2 ml 12.47 ml/m  AORTIC VALVE AV Area (Vmax):    2.85 cm AV Area (Vmean):   2.41 cm AV Area (VTI):     2.39 cm AV Vmax:           124.00 cm/s AV Vmean:          93.450 cm/s AV VTI:            0.166 m AV Peak Grad:      6.2 mmHg AV Mean Grad:      4.0 mmHg LVOT Vmax:         124.50 cm/s LVOT Vmean:        79.500 cm/s LVOT VTI:          0.140 m LVOT/AV VTI ratio: 0.84  AORTA Ao Root diam: 3.30 cm Ao Asc diam:  2.80 cm MITRAL VALVE               TRICUSPID VALVE MV Area (PHT): 4.06 cm    TR Peak grad:   30.0 mmHg MV Area VTI:   1.72 cm    TR Vmax:        274.00 cm/s MV Peak grad:  5.3 mmHg MV Mean grad:  2.0 mmHg    SHUNTS MV Vmax:       1.15 m/s    Systemic VTI:  0.14 m MV Vmean:      68.2 cm/s   Systemic Diam: 1.90 cm MV Decel Time: 187 msec MV E velocity: 72.90 cm/s MV A velocity: 89.60 cm/s MV E/A ratio:  0.81 Epifanio Lesches MD Electronically signed by Epifanio Lesches MD Signature Date/Time: 06/17/2022/10:47:05 AM    Final    CT VENOGRAM HEAD  Result Date: 06/17/2022 CLINICAL DATA:  Intracranial hemorrhage. EXAM: CT VENOGRAM HEAD TECHNIQUE: Venographic phase images of the brain were obtained following the administration of intravenous contrast. Multiplanar reformats and maximum intensity projections were generated. RADIATION DOSE REDUCTION: This exam was performed according to the departmental dose-optimization program which includes automated exposure control, adjustment of the mA  and/or kV according to patient size and/or use of iterative reconstruction technique. CONTRAST:  75mL OMNIPAQUE IOHEXOL 350 MG/ML SOLN COMPARISON:  None Available. FINDINGS: Filling defect of the right transverse sinus and right transverse sigmoid junction  which are somewhat large and tubular for arachnoid granulations, although an arachnoid granulation at the left transverse sigmoid junction appears nearly similar. The location would also not explain the acute hemorrhage. No visible cortical or central venous thrombosis. IMPRESSION: No cortical or central venous thrombosis to explain the ICH. There are filling defects at the right transverse and transverse sigmoid dural sinuses which are prominent for arachnoid granulations but not definitive for thrombus. Attention at follow-up imaging. Electronically Signed   By: Tiburcio Pea M.D.   On: 06/17/2022 05:30   CT ANGIO HEAD NECK W WO CM  Result Date: 06/17/2022 CLINICAL DATA:  Found unresponsive. Intracranial hemorrhage. History of cocaine use EXAM: CT ANGIOGRAPHY HEAD AND NECK WITH AND WITHOUT CONTRAST TECHNIQUE: Multidetector CT imaging of the head and neck was performed using the standard protocol during bolus administration of intravenous contrast. Multiplanar CT image reconstructions and MIPs were obtained to evaluate the vascular anatomy. Carotid stenosis measurements (when applicable) are obtained utilizing NASCET criteria, using the distal internal carotid diameter as the denominator. RADIATION DOSE REDUCTION: This exam was performed according to the departmental dose-optimization program which includes automated exposure control, adjustment of the mA and/or kV according to patient size and/or use of iterative reconstruction technique. CONTRAST:  75mL OMNIPAQUE IOHEXOL 350 MG/ML SOLN COMPARISON:  Head CT from earlier today FINDINGS: CTA NECK FINDINGS Aortic arch: Unremarkable Right carotid system: Mixed density plaque at the bifurcation with some  low-density plaque bulging into the ICA bulb. No ulceration or flow limiting stenosis Left carotid system: Mainly low-density plaque at the bifurcation without stenosis or ulceration. Vertebral arteries: No proximal subclavian stenosis. The vertebral arteries are somewhat tortuous but smoothly contoured and diffusely patent Skeleton: No acute or aggressive finding. Other neck: No acute finding Upper chest: Mild opacification of dependent lungs which could be atelectasis. Centrilobular emphysema. Review of the MIP images confirms the above findings CTA HEAD FINDINGS Anterior circulation: No aneurysm or spot sign seen underlying the ICH. There is a aneurysm projecting leftward from the left carotid terminus which measures 4 mm. No adjacent hemorrhage. No major branch occlusion. There is undulation of the bilateral intracranial branches which is likely atheromatous given findings in the neck. No segmental beading. Some less intense flow and right MCA branches is likely related to mass effect by the ICH. Posterior circulation: The vertebral and basilar arteries arediffusely patent. Undulation of the left more than right PCA with up to moderate narrowing on the left, likely atheromatous given the findings in the neck. Venous sinuses: Reference dedicated CT venogram Anatomic variants: No acute finding Review of the MIP images confirms the above findings IMPRESSION: 1. No vascular lesion or spot sign seen at the acute ICH. 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 3. 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 Electronically Signed   By: Tiburcio Pea M.D.   On: 06/17/2022 05:18   CT Head Wo Contrast  Result Date: 06/17/2022 CLINICAL DATA:  Altered mental status EXAM: CT HEAD WITHOUT CONTRAST TECHNIQUE: Contiguous axial images were obtained from the base of the skull through the vertex without intravenous contrast. RADIATION DOSE REDUCTION: This exam was performed according to the  departmental dose-optimization program which includes automated exposure control, adjustment of the mA and/or kV according to patient size and/or use of iterative reconstruction technique. COMPARISON:  None Available. FINDINGS: Brain: There is a large area of parenchymal hemorrhage identified in the right cerebral hemisphere centered primarily within the basal ganglia and extending superiorly into the centrum semi ovale. This  measures approximately 4.7 x 4.0 cm in greatest AP and transverse dimensions respectively. It extends for approximately 4.7 cm in craniocaudad dimension. Significant mass-effect upon the right lateral ventricle is noted. Mild midline shift of approximately 5 mm is noted. Some surrounding edema is seen as well. No other hemorrhage is noted. Relative loss of the sulcal markings is noted likely related to some increased intracranial pressure. Vascular: No hyperdense vessel or unexpected calcification. Skull: Normal. Negative for fracture or focal lesion. Sinuses/Orbits: No acute finding. Other: None. IMPRESSION: Right-sided intraparenchymal hemorrhage as described with associated edema and mild midline shift of 5 mm from right to left. Critical Value/emergent results were called by telephone at the time of interpretation on 06/17/2022 at 2:27 am to Dr. Donna Bernard , who verbally acknowledged these results. Electronically Signed   By: Alcide Clever M.D.   On: 06/17/2022 02:29   DG Abdomen 1 View  Result Date: 06/17/2022 CLINICAL DATA:  Check gastric catheter placement EXAM: ABDOMEN - 1 VIEW COMPARISON:  None Available. FINDINGS: Gastric catheter is noted extending into the stomach. Proximal side port lies at the gastroesophageal junction. This should be advanced further into the stomach. No free air is seen. No bony abnormality is noted. IMPRESSION: Gastric catheter as described. Electronically Signed   By: Alcide Clever M.D.   On: 06/17/2022 02:16   DG Chest Port 1 View  Result Date:  06/17/2022 CLINICAL DATA:  Check endotracheal tube placement EXAM: PORTABLE CHEST 1 VIEW COMPARISON:  10/21/2020 FINDINGS: Cardiac shadow is within normal limits. Endotracheal tube is noted 1.5 cm above the carina. Gastric catheter is noted extending into the stomach. Patchy increased density is noted in the right middle lobe beneath the minor fissure consistent with acute infiltrate. No sizable effusion is noted. No bony abnormality is seen. IMPRESSION: Right middle lobe infiltrate. Tubes and lines as described. Electronically Signed   By: Alcide Clever M.D.   On: 06/17/2022 02:16     PHYSICAL EXAM  Temp:  [98.6 F (37 C)-100.2 F (37.9 C)] 99.7 F (37.6 C) (05/11 0900) Pulse Rate:  [63-103] 76 (05/11 0900) Resp:  [15-23] 17 (05/11 0900) BP: (109-162)/(62-133) 139/80 (05/11 0900) SpO2:  [95 %-100 %] 98 % (05/11 0900) FiO2 (%):  [50 %-60 %] 50 % (05/11 0800) Weight:  [60.5 kg] 60.5 kg (05/11 0305)  General - Well nourished, well developed, intubated on sedation.  Ophthalmologic - fundi not visualized due to noncooperation.  Cardiovascular - Regular rate and rhythm.  Neuro - intubated on fentanyl, eyes slowly open to voice, not following commands. With eye opening, eyes in upward gaze position, not blinking to visual threat, doll's eyes present, not tracking, PERRL. Corneal reflex present, gag and cough present. Breathing over the vent.  Facial symmetry not able to test due to ET tube.  Tongue protrusion not cooperative. On pain stimulation, RUE and RLE actively against gravity, however left upper and lower extremity flaccid. Sensation, coordination and gait not tested.   ASSESSMENT/PLAN Rebecca [name] is a 47 y.o. female with history of cocaine abuse admitted for unresponsiveness.  Intubated in ER for airway protection.  No tPA given due to ICH.    ICH:  right BG and CR ICH, still more likely due to cocaine induced hypertension CT right BG and CR large ICH, 5 mm midline shift CT  head and neck 4 mm left terminal ICA aneurysm CTV no venous thrombosis MRI pending CT repeat pending 2D Echo EF 50 to 55% LDL 65 HgbA1c 5.9  UDS positive for cocaine SCDs for VTE prophylaxis No antithrombotic prior to admission, now on No antithrombotic due to ICH. Therapy recommendations: Pending Disposition: Pending  Cerebral edema CT right BG and CR large ICH, 5 mm midline shift CT repeat pending Na 151->155->157->159 Off 3% saline On free water Na monitoring LTM EEG no seizure so far  Respiratory failure Aspiration pneumonia Leukocytosis Intubated on vent Still on fentanyl CCM on board CXR right middle lobe injury patient On azithromycin and Rocephin Tmax 100.6 WBC 22.2->20.1  Hypertension BP unstable Put on cleviprex Labetalol and hydralazine IV PRN BP goal < 160 Long term BP goal normotensive  AKI Creatinine 2.15-1.32-1.03 On tube feeding  UTI UA WBC 21-50 On Rocephin Urine culture pending  Cocaine abuse History of cocaine abuse UDS positive for cocaine Cessation education will be provided  Dysphagia On tube feeding  Other Stroke Risk Factors   Other Active Problems Cerebral aneurysm, CTA head and neck showed 4 mm left terminal ICA aneurysm, asymptomatic, outpatient follow-up  Hospital day # 1  This patient is critically ill due to large ICH, cerebral edema, cocaine abuse, AKI, pneumonia, UTI and at significant risk of neurological worsening, death form reherniation, brain death, seizure, sepsis. This patient's care requires constant monitoring of vital signs, hemodynamics, respiratory and cardiac monitoring, review of multiple databases, neurological assessment, discussion with family, other specialists and medical decision making of high complexity. I spent 40 minutes of neurocritical care time in the care of this patient.  I discussed with CCM Dr. Cecile Sheerer, MD PhD Stroke Neurology 06/18/2022 10:55 AM    To contact Stroke  Continuity provider, please refer to [email or web address]. After hours, contact General Neurology

## 2022-06-18 NOTE — Progress Notes (Signed)
OT Cancellation Note  Patient Details Name: ALEXSANDRIA BILLETT MRN: [id number] DOB: 1975-08-08   Cancelled Treatment:    Reason Eval/Treat Not Completed: Active bedrest order (Intubated. Will return as schedule allows.)  La Dibella M Trinidee Schrag Adonna Horsley MSOT, OTR/L Acute Rehab Office: [phone] 06/18/2022, 12:14 PM

## 2022-06-18 NOTE — Procedures (Signed)
Central Venous Catheter Insertion Procedure Note  Line type:  forward, left on left  Indications: No peripheral access  Procedure Details:  Informed consent was obtained after explanation of the risks and benefits of the procedure, refer to the consent documentation.  Time-out was performed immediately prior to the procedure.  The left, superior vein was identified using bedside ultrasound. This area was prepped and draped in the usual sterile fashion. Maximum sterile technique was used including antiseptics, cap, gloves, gown, hand hygiene, mask, and sterile sheet.  The patient was placed in Trendelenburgs position. Local anesthesia with 1% lidocaine was applied subcutaneously then deep to the skin. The left, anterior was then inserted into the left, superior vein using ultrasound guidance.  Using the Seldinger Technique a forward, left on left was placed with each port easily flushed and freely drawing venous blood.  The catheter was secured with sutures. A sterile bandage was placed over the site.  Condition: The patient tolerated the procedure well and remains in the same condition as pre-procedure.  Complications: None; patient tolerated the procedure well.  Plan: CXR was ordered to verify placement.

## 2022-06-18 NOTE — Progress Notes (Signed)
PT Cancellation Note  Patient Details Name: Rebecca [name] MRN: [id number] DOB: Jun 16, 1975   Cancelled Treatment:    Reason Eval/Treat Not Completed: Medical issues which prohibited therapy remain at this time. Pt remains intubated and sedated, [name], PT, [name] 06/18/2022, 2:34 PM

## 2022-06-18 NOTE — Procedures (Signed)
Patient Name: Rebecca [name]  MRN: [id number]  Epilepsy Attending: Charlsie Quest  Referring Physician/Provider: Erick Blinks, MD  Duration: 06/17/2022 1215 to 06/18/2022 1215  Patient history: 47 y.o. female with hx of cocaine use p/w unresponsive and large R IPH with brain compression and 5mm midline shift. EEG to evaluate for seizure  Level of alertness: Awake, asleep  AEDs during EEG study: None  Technical aspects: This EEG study was done with scalp electrodes positioned according to the 10-20 International system of electrode placement. Electrical activity was reviewed with band pass filter of 1-70Hz , sensitivity of 7 uV/mm, display speed of 81mm/sec with a 60Hz  notched filter applied as appropriate. EEG data were recorded continuously and digitally stored.  Video monitoring was available and reviewed as appropriate.  Description: The posterior dominant rhythm consists of 7.5 Hz activity of moderate voltage (25-35 uV) seen predominantly in posterior head regions, symmetric and reactive to eye opening and eye closing. Sleep was characterized by vertex waves, sleep spindles (12 to 14 Hz), maximal frontocentral region. EEG showed continuous 3 to 6 Hz theta-delta slowing in right hemisphere, maximal right temporal region. Intermittent generalized 3-6hz  theta-delta slowing was also noted. Hyperventilation and photic stimulation were not performed.     ABNORMALITY - Continuous slow, right hemisphere, maximal right temporal region - Intermittent slow, generalized  IMPRESSION: This study is suggestive of cortical dysfunction arising from right hemisphere, maximal right temporal region likely secondary to underlying structural abnormality. Additionally there is mild to moderate diffuse encephalopathy. No seizures or epileptiform discharges were seen throughout the recording.  Neetu Carrozza Annabelle Harman

## 2022-06-18 NOTE — Progress Notes (Signed)
Notified lab phlebotomy that pt is now considered a nurse draw due to presence of CVC.

## 2022-06-18 NOTE — Procedures (Deleted)
Central Venous Catheter Insertion Procedure Note  Rebecca [name]  [id number]  1975-11-29  Date:06/18/22  Time:6:32 AM   Provider Performing:Dossie Ocanas R Keithen Capo   Procedure: Insertion of Non-tunneled Central Venous [phone]) with US guidance ([id number])   Indication(s) Medication administration  Consent Risks of the procedure as well as the alternatives and risks of each were explained to the patient and/or caregiver.  Consent for the procedure was obtained and is signed in the bedside chart  Anesthesia Topical only with 1% lidocaine   Timeout Verified patient identification, verified procedure, site/side was marked, verified correct patient position, special equipment/implants available, medications/allergies/relevant history reviewed, required imaging and test results available.  Sterile Technique Maximal sterile technique including full sterile barrier drape, hand hygiene, sterile gown, sterile gloves, mask, hair covering, sterile ultrasound probe cover (if used).  Procedure Description Area of catheter insertion was cleaned with chlorhexidine and draped in sterile fashion.  With real-time ultrasound guidance a central venous catheter was placed into the left internal jugular vein. Nonpulsatile blood flow and easy flushing noted in all ports.  The catheter was sutured in place and sterile dressing applied.  Complications/Tolerance None; patient tolerated the procedure well. Chest X-ray is ordered to verify placement for internal jugular or subclavian cannulation.   Chest x-ray is not ordered for femoral cannulation.  EBL Minimal  Specimen(s) None   Rebecca Gasman Nathanuel Cabreja, PA-C

## 2022-06-18 NOTE — Plan of Care (Signed)
  Problem: Education: Goal: Knowledge of General Education information will improve Description: Including pain rating scale, medication(s)/side effects and non-pharmacologic comfort measures Outcome: Progressing   Problem: Health Behavior/Discharge Planning: Goal: Ability to manage health-related needs will improve Outcome: Progressing   Problem: Clinical Measurements: Goal: Ability to maintain clinical measurements within normal limits will improve Outcome: Progressing Goal: Will remain free from infection Outcome: Progressing Goal: Diagnostic test results will improve Outcome: Progressing Goal: Respiratory complications will improve Outcome: Progressing Goal: Cardiovascular complication will be avoided Outcome: Progressing   Problem: Activity: Goal: Risk for activity intolerance will decrease Outcome: Progressing   Problem: Nutrition: Goal: Adequate nutrition will be maintained Outcome: Progressing   Problem: Coping: Goal: Level of anxiety will decrease Outcome: Progressing   Problem: Elimination: Goal: Will not experience complications related to bowel motility Outcome: Progressing Goal: Will not experience complications related to urinary retention Outcome: Progressing   Problem: Pain Managment: Goal: General experience of comfort will improve Outcome: Progressing   Problem: Safety: Goal: Ability to remain free from injury will improve Outcome: Progressing   Problem: Skin Integrity: Goal: Risk for impaired skin integrity will decrease Outcome: Progressing   Problem: Education: Goal: Knowledge of disease or condition will improve Outcome: Progressing Goal: Knowledge of secondary prevention will improve (MUST DOCUMENT ALL) Outcome: Progressing Goal: Knowledge of patient specific risk factors will improve (Mark N/A or DELETE if not current risk factor) Outcome: Progressing   Problem: Intracerebral Hemorrhage Tissue Perfusion: Goal: Complications of  Intracerebral Hemorrhage will be minimized Outcome: Progressing   Problem: Coping: Goal: Will verbalize positive feelings about self Outcome: Progressing Goal: Will identify appropriate support needs Outcome: Progressing   Problem: Health Behavior/Discharge Planning: Goal: Ability to manage health-related needs will improve Outcome: Progressing Goal: Goals will be collaboratively established with patient/family Outcome: Progressing   Problem: Self-Care: Goal: Ability to participate in self-care as condition permits will improve Outcome: Progressing Goal: Verbalization of feelings and concerns over difficulty with self-care will improve Outcome: Progressing Goal: Ability to communicate needs accurately will improve Outcome: Progressing   Problem: Nutrition: Goal: Risk of aspiration will decrease Outcome: Progressing Goal: Dietary intake will improve Outcome: Progressing   

## 2022-06-19 ENCOUNTER — Inpatient Hospital Stay (HOSPITAL_COMMUNITY): Payer: Medicaid Other

## 2022-06-19 DIAGNOSIS — I6389 Other cerebral infarction: Secondary | ICD-10-CM | POA: Diagnosis not present

## 2022-06-19 DIAGNOSIS — J9601 Acute respiratory failure with hypoxia: Secondary | ICD-10-CM | POA: Diagnosis not present

## 2022-06-19 DIAGNOSIS — R569 Unspecified convulsions: Secondary | ICD-10-CM | POA: Diagnosis not present

## 2022-06-19 DIAGNOSIS — F1410 Cocaine abuse, uncomplicated: Secondary | ICD-10-CM | POA: Diagnosis not present

## 2022-06-19 DIAGNOSIS — I610 Nontraumatic intracerebral hemorrhage in hemisphere, subcortical: Secondary | ICD-10-CM | POA: Diagnosis not present

## 2022-06-19 DIAGNOSIS — J9602 Acute respiratory failure with hypercapnia: Secondary | ICD-10-CM

## 2022-06-19 LAB — GLUCOSE, CAPILLARY
Glucose-Capillary: 103 mg/dL — ABNORMAL HIGH (ref 70–99)
Glucose-Capillary: 110 mg/dL — ABNORMAL HIGH (ref 70–99)
Glucose-Capillary: 123 mg/dL — ABNORMAL HIGH (ref 70–99)
Glucose-Capillary: 126 mg/dL — ABNORMAL HIGH (ref 70–99)
Glucose-Capillary: 127 mg/dL — ABNORMAL HIGH (ref 70–99)
Glucose-Capillary: 81 mg/dL (ref 70–99)
Glucose-Capillary: 98 mg/dL (ref 70–99)

## 2022-06-19 LAB — SODIUM
Sodium: 155 mmol/L — ABNORMAL HIGH (ref 135–145)
Sodium: 155 mmol/L — ABNORMAL HIGH (ref 135–145)
Sodium: 158 mmol/L — ABNORMAL HIGH (ref 135–145)
Sodium: 155 mmol/L — ABNORMAL HIGH (ref 135–145)

## 2022-06-19 LAB — CULTURE, RESPIRATORY W GRAM STAIN

## 2022-06-19 LAB — CULTURE, BLOOD (ROUTINE X 2)

## 2022-06-19 MED ORDER — MIDAZOLAM HCL 2 MG/2ML IJ SOLN
INTRAMUSCULAR | Status: AC
  Filled 2022-06-19: qty 2

## 2022-06-19 MED ORDER — GADOBUTROL 1 MMOL/ML IV SOLN
6.0000 mL | Freq: Once | INTRAVENOUS | Status: AC | PRN
  Administered 2022-06-19: 6 mL via INTRAVENOUS

## 2022-06-19 MED ORDER — FREE WATER
200.0000 mL | Freq: Four times a day (QID) | Status: DC
  Administered 2022-06-19 – 2022-06-20 (×3): 200 mL

## 2022-06-19 MED ORDER — HEPARIN SODIUM (PORCINE) 5000 UNIT/ML IJ SOLN
5000.0000 [IU] | Freq: Three times a day (TID) | INTRAMUSCULAR | Status: DC
  Administered 2022-06-19 – 2022-07-01 (×33): 5000 [IU] via SUBCUTANEOUS
  Filled 2022-06-19 (×34): qty 1

## 2022-06-19 NOTE — Progress Notes (Signed)
Ventilator patient transported from 3M03 to CT3 and back without any issues.

## 2022-06-19 NOTE — Procedures (Signed)
Patient Name: Rebecca [name]  MRN: [id number]  Epilepsy Attending: Charlsie Quest  Referring Physician/Provider: Erick Blinks, MD  Duration: 06/18/2022 1215 to 06/19/2022 1430   Patient history: 47 y.o. female with hx of cocaine use p/w unresponsive and large R IPH with brain compression and 5mm midline shift. EEG to evaluate for seizure   Level of alertness: Awake, asleep   AEDs during EEG study: None   Technical aspects: This EEG study was done with scalp electrodes positioned according to the 10-20 International system of electrode placement. Electrical activity was reviewed with band pass filter of 1-70Hz , sensitivity of 7 uV/mm, display speed of 82mm/sec with a 60Hz  notched filter applied as appropriate. EEG data were recorded continuously and digitally stored.  Video monitoring was available and reviewed as appropriate.   Description: The posterior dominant rhythm consists of 7.5 Hz activity of moderate voltage (25-35 uV) seen predominantly in posterior head regions, symmetric and reactive to eye opening and eye closing. Sleep was characterized by vertex waves, sleep spindles (12 to 14 Hz), maximal frontocentral region. EEG showed continuous 3 to 6 Hz theta-delta slowing in right hemisphere, maximal right temporal region. Intermittent generalized 3-6hz  theta-delta slowing was also noted. Hyperventilation and photic stimulation were not performed.   EEG was disconnected between 06/19/2022 0512 to 0749 for MRI brain     ABNORMALITY - Continuous slow, right hemisphere, maximal right temporal region - Intermittent slow, generalized   IMPRESSION: This study is suggestive of cortical dysfunction arising from right hemisphere, maximal right temporal region likely secondary to underlying structural abnormality. Additionally there is mild to moderate diffuse encephalopathy. No seizures or epileptiform discharges were seen throughout the recording.   Braidon Chermak Annabelle Harman

## 2022-06-19 NOTE — Progress Notes (Signed)
NAME:  Rebecca [name], MRN:  [id number], DOB:  11-27-1975, LOS: 2 ADMISSION DATE:  06/17/2022, CONSULTATION DATE:  06/19/22 REFERRING MD:  EDP, CHIEF COMPLAINT:  unresponsive   History of Present Illness:  47 yo female with hx of polysubstance abuse presented brought to [facility] ED with AMS.  Intubated for airway protection.  CT head showed large Rt ICH with 5 mm midline shift.  UDS positive for cocaine.  Transferred to [facility] for further management.  PCCM consulted to assist with management in ICU.   Hx from chart and medical team.  Pertinent  Medical History  Substance abuse  Significant Hospital Events: Including procedures, antibiotic start and stop dates in addition to other pertinent events   5/10 Admit, neurosurgery consulted 5/11 remains on ventilator with hypertonic saline and EEG in place 5/12 no acute events overnight, MRI obtained results pending  Interim History / Subjective:  Remains minimally responsive on ventilator with fentanyl drip.  Objective   Blood pressure (!) 220/107, pulse 64, temperature 99.5 F (37.5 C), resp. rate 17, height 5\' 5"  (1.651 m), weight 60.8 kg, SpO2 95 %.    Vent Mode: PRVC FiO2 (%):  [40 %-50 %] 40 % Set Rate:  [18 bmp] 18 bmp Vt Set:  [480 mL] 480 mL PEEP:  [5 cmH20] 5 cmH20 Plateau Pressure:  [13 cmH20-16 cmH20] 15 cmH20   Intake/Output Summary (Last 24 hours) at 06/19/2022 0737 Last data filed at 06/19/2022 4098 Gross per 24 hour  Intake 2315.6 ml  Output 890 ml  Net 1425.6 ml    Filed Weights   06/17/22 0800 06/18/22 0305 06/19/22 0500  Weight: 60.2 kg 60.5 kg 60.8 kg   Physical exam General: Acute ill-appearing adult female lying in bed on mechanical ventilation in no acute distress HEENT: ETT, MM pink/moist, pupils 1 and sluggish,  Neuro: Flicker eyes open to painful stimuli, cough and gag present,  CV: s1s2 regular rate and rhythm, no murmur, rubs, or gallops,  PULM: Clear to auscultation bilaterally, no increased work of  breathing, no added breath sounds, tolerating ventilator GI: soft, bowel sounds active in all 4 quadrants, non-tender, non-distended, tolerating TF Extremities: warm/dry, no edema  Skin: no rashes or lesions  Resolved Hospital Problem list     Assessment & Plan:   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 Head of bed elevated 30 degrees. Plateau pressures less than 30 cm H20.  Follow intermittent chest x-ray and ABG.   Ensure adequate pulmonary hygiene  Follow cultures  VAP bundle in place  PAD protocol Continue ceftriaxone and azithromycin  New HFrEF -Echocardiogram completed 06/16/2022 with EF 50 to 55%, moderate left ventricular hypertrophy and grade 1 diastolic dysfunction Elevated troponin in setting of cocaine abuse -High-sensitivity troponin peaked at 708 P: Continuous telemetry Strict intake and output Daily weight to assess volume status Daily assessment for need to diurese Optimize electrolytes  Urinary tract infection present on admission -Urinalysis with many bacteria, moderate leukocytes, but  negative nitrates.  Due to poor mental status and current intubation we will to determine if patient is symptomatic P: Continue empiric ceftriaxone and azithromycin as above  AKI from hypovolemia -Baseline  creatinine 0.87 from 10/21/20, creatinine on admission 2.03 GFR 30 Hypokalemia P: Follow renal function Monitor urine output Trend [place] Avoid nephrotoxins Ensure adequate renal perfusion  Anemia P: Transfuse per protocol Hemoglobin goal 7 Monitor for signs of bleeding  At risk malnutrition P: Continue tube feeds  Best Practice:   Diet/type: tubefeeds DVT prophylaxis: SCD GI prophylaxis: PPI Lines: N/A Foley:  Yes, and it is still needed Code Status:  full code Last date of multidisciplinary goals of care discussion: Patient sedated on ventilator, unable to engage in goals of care discussion.  Await family arrival  Critical care time:   CRITICAL CARE Performed by: Dmani Mizer D. Harris  Total critical care time: 38 minutes  Critical care time was exclusive of separately billable procedures and treating other patients.  Critical care was necessary to treat or prevent imminent or life-threatening deterioration.  Critical care was time spent personally by me on the following activities: development of treatment plan with patient and/or surrogate as well as nursing, discussions with consultants, evaluation of patient's response to treatment, examination of patient, obtaining history from patient or surrogate, ordering and performing treatments and interventions, ordering and review of laboratory studies, ordering and review of radiographic studies, pulse oximetry and re-evaluation of patient's condition.  Rossi Silvestro D. Harris, NP-C [facility] Personal contact information can be found on Amion  If no contact or response made please call [phone] 06/19/2022, 7:37 AM

## 2022-06-19 NOTE — Progress Notes (Addendum)
STROKE TEAM PROGRESS NOTE   SUBJECTIVE (INTERVAL HISTORY) Patient is seen in her room with her aunt and cousin at the bedside.  This morning, she was noted to have her left pupil larger than her right pupil, with left pupil about 4 mm and sluggishly reactive, right pupil about 1.5 mm and more briskly reactive.  Stat head CT shows stable ICH.  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  Has had some episodes of hypertension overnight, but largely remains hemodynamically stable.  Her neurological exam is otherwise stable.   OBJECTIVE Temp:  [98.6 F (37 C)-100.6 F (38.1 C)] 98.7 F (37.1 C) (05/12 1113) Pulse Rate:  [58-101] 58 (05/12 0800) Cardiac Rhythm: Normal sinus rhythm;Sinus bradycardia (05/12 0800) Resp:  [13-22] 18 (05/12 0800) BP: (118-220)/(68-118) 153/77 (05/12 0800) SpO2:  [92 %-100 %] 92 % (05/12 1059) FiO2 (%):  [40 %-50 %] 40 % (05/12 1100) Weight:  [60.8 kg] 60.8 kg (05/12 0500)  Recent Labs  Lab 06/18/22 1923 06/19/22 0029 06/19/22 0357 06/19/22 0722 06/19/22 1158  GLUCAP 101* 103* 98 81 123*    Recent Labs  Lab 06/17/22 0759 06/17/22 0922 06/17/22 2002 06/17/22 2310 06/18/22 0242 06/18/22 0616 06/18/22 0915 06/18/22 1441 06/18/22 2110 06/19/22 0301 06/19/22 0842  NA 144   < > 155* 157* 157* 157* 157* 159* 158* 155* 155*  K 3.2*   < > 4.6 4.6 4.2 4.9 4.0  --   --   --   --   CL 112*   < > 129* >130* >130* >130* 129*  --   --   --   --   CO2 21*   < > 19* 20* 20* 17* 20*  --   --   --   --   GLUCOSE 106*   < > 111* 116* 104* 125* 132*  --   --   --   --   BUN 39*   < > 37* 46* 39* 38* 32*  --   --   --   --   CREATININE 2.15*   < > 1.32* 1.26* 1.13* 1.16* 1.03*  --   --   --   --   CALCIUM 8.6*   < > 8.8* 8.7* 8.8* 9.2 8.7*  --   --   --   --   MG 2.0  --   --   --  2.3  --   --   --   --   --   --   PHOS  --   --   --   --  2.6  --   --   --   --   --   --    < > = values in this interval not displayed.    Recent Labs  Lab  06/17/22 0145  AST 29  ALT 14  ALKPHOS 78  BILITOT 1.5*  PROT 8.9*  ALBUMIN 3.8    Recent Labs  Lab 06/17/22 0145 06/17/22 0922 06/18/22 0242  WBC 22.2*  --  20.1*  NEUTROABS 19.9*  --   --   HGB 13.3 11.2* 9.3*  HCT 43.6 33.0* 32.2*  MCV 71.9*  --  75.8*  PLT 432*  --  284    Recent Labs  Lab 06/17/22 0759  CKTOTAL 67    Recent Labs    06/17/22 0145  LABPROT 16.6*  INR 1.3*    Recent Labs    06/17/22 0206  COLORURINE AMBER*  LABSPEC  1.020  PHURINE 5.0  GLUCOSEU NEGATIVE  HGBUR NEGATIVE  BILIRUBINUR NEGATIVE  KETONESUR NEGATIVE  PROTEINUR >=300*  NITRITE NEGATIVE  LEUKOCYTESUR MODERATE*        Component Value Date/Time   CHOL 126 06/18/2022 0242   TRIG 78 06/18/2022 0242   HDL 45 06/18/2022 0242   CHOLHDL 2.8 06/18/2022 0242   VLDL 16 06/18/2022 0242   LDLCALC 65 06/18/2022 0242   Lab Results  Component Value Date   HGBA1C 5.9 (H) 06/18/2022      Component Value Date/Time   LABOPIA NONE DETECTED 06/17/2022 0206   COCAINSCRNUR POSITIVE (A) 06/17/2022 0206   LABBENZ NONE DETECTED 06/17/2022 0206   AMPHETMU NONE DETECTED 06/17/2022 0206   THCU NONE DETECTED 06/17/2022 0206   LABBARB NONE DETECTED 06/17/2022 0206    Recent Labs  Lab 06/17/22 0145  ETH <10     I have personally reviewed the radiological images below and agree with the radiology interpretations.  CT HEAD WO CONTRAST ( )  Result Date: 06/19/2022 CLINICAL DATA:  Follow-up ICH EXAM: CT HEAD WITHOUT CONTRAST TECHNIQUE: Contiguous axial images were obtained from the base of the skull through the vertex without intravenous contrast. RADIATION DOSE REDUCTION: This exam was performed according to the departmental dose-optimization program which includes automated exposure control, adjustment of the mA and/or kV according to patient size and/or use of iterative reconstruction technique. COMPARISON:  MRI from earlier today. FINDINGS: Brain: Large acute hemorrhage centered at the  right basal ganglia and adjacent white matter without progression in size or shape when compared to prior. Cytotoxic edema most apparent in the right frontal parietal cortex, infarcts being under appreciated when compared to preceding brain MRI. Diffuse effacement of subarachnoid spaces, leftward midline shift of 5 mm. Unchanged mild dilatation of the left lateral ventricle. Vascular: No hyperdense vessel or unexpected calcification. Skull: Normal. Negative for fracture or focal lesion. Sinuses/Orbits: No acute finding. IMPRESSION: Right ICH and extensive cortical infarction that is stable from brain MRI earlier today. No new abnormality. Leftward midline shift of 5 mm. Electronically Signed   By: Tiburcio Pea M.D.   On: 06/19/2022 11:13   MR BRAIN W WO CONTRAST  Result Date: 06/19/2022 CLINICAL DATA:  Hemorrhagic stroke workup EXAM: MRI HEAD WITHOUT AND WITH CONTRAST TECHNIQUE: Multiplanar, multiecho pulse sequences of the brain and surrounding structures were obtained without and with intravenous contrast. CONTRAST:  6mL GADAVIST GADOBUTROL 1 MMOL/ML IV SOLN COMPARISON:  Head CT and CTA from 2 days prior FINDINGS: Brain: Multiple areas of restricted diffusion most confluent in the right parietal and posterior temporal cortex. Restricted diffusion is seen surrounding the large and known right basal ganglia hemorrhage which measures up to 5.3 cm anterior to posterior with cleft of fluid surrounding the hematoma. Infarcts in other arterial distributions are also present, including along the left cerebral convexity (especially parietooccipital)and at the right brainstem. Midline shift measures 5 mm. There is periventricular FLAIR hyperintensity suggesting chronic small vessel disease. There is asymmetric mild dilatation of the left temporal horn. Vascular: Filling defect in the right sigmoid sinus and upper internal jugular vein. Skull and upper cervical spine: Normal marrow signal Sinuses/Orbits: Mild mucosal  thickening in the mastoid air cells. Retention cysts in the right maxillary sinus. Diffuse mucosal thickening in the paranasal sinuses. Negative orbits. IMPRESSION: 1. 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 The most confluent area is in the right MCA territory with infarct likely underlying the right basal ganglia hematoma. 2. Dural venous sinus thrombosis  affecting the right sigmoid sinus and extending into the upper right IJ. The pattern of infarcts and hemorrhage do not correlate with the clot location however. 3. Mild dilatation of the temporal horn of the left lateral ventricle. 5 mm of midline shift. Electronically Signed   By: Tiburcio Pea M.D.   On: 06/19/2022 06:59   DG Abd 1 View  Result Date: 06/18/2022 CLINICAL DATA:  Check gastric catheter placement EXAM: ABDOMEN - 1 VIEW COMPARISON:  Film from earlier in the same day. FINDINGS: Gastric catheter has been advanced and now lies completely within the stomach. Scattered large and small bowel gas is noted. IMPRESSION: Gastric catheter within the stomach as described. Electronically Signed   By: Alcide Clever M.D.   On: 06/18/2022 23:47   DG Abd 1 View  Result Date: 06/18/2022 CLINICAL DATA:  Feeding tube placement. EXAM: ABDOMEN - 1 VIEW COMPARISON:  Jun 18, 2022 (6:44 p.m.) FINDINGS: A nasogastric tube is seen with its distal tip overlying the expected region of the body of the stomach. The distal side hole sits at the level of the gastroesophageal junction. The bowel gas pattern is normal. No radio-opaque calculi or other significant radiographic abnormality are seen. IMPRESSION: Nasogastric tube positioning, as described above. Further advancement of the NG tube by approximately 7 cm is recommended to decrease the risk of aspiration. Electronically Signed   By: Aram Candela M.D.   On: 06/18/2022 22:23   DG Abd Portable 1V  Result Date: 06/18/2022 CLINICAL DATA:  Check gastric catheter  placement EXAM: PORTABLE ABDOMEN - 1 VIEW COMPARISON:  None Available. FINDINGS: Scattered large and small bowel gas is noted. Gastric catheter is noted with the tip in the stomach. Proximal side port lies in the distal esophagus. This should be advanced several cm deeper into the stomach. IMPRESSION: Gastric catheter as described. This should be advanced deeper into the stomach. Electronically Signed   By: Alcide Clever M.D.   On: 06/18/2022 21:24   DG CHEST PORT 1 VIEW  Result Date: 06/18/2022 CLINICAL DATA:  Evaluate central line placement EXAM: PORTABLE CHEST 1 VIEW COMPARISON:  Jun 18, 2022 FINDINGS: The side port of the NG tube is near the GE junction. The distal tip is in the stomach. The ETT is in good position. A left central line is been placed in the interval terminating in the central SVC. No pneumothorax. The left lung is clear. Right middle lobe infiltrate remains. No other interval changes. IMPRESSION: 1. Support apparatus as above. The side port of the NG tube is near the GE junction. Recommend advancing the tube. 2. The left lung is clear. Right middle lobe infiltrate remains. 3. No other interval changes. Electronically Signed   By: Gerome Sam III M.D.   On: 06/18/2022 17:20   Overnight EEG with video  Result Date: 06/18/2022 Charlsie Quest, MD     06/19/2022  9:37 AM Patient Name: Rebecca [name] MRN: [id number] Epilepsy Attending: Charlsie Quest Referring Physician/Provider: Erick Blinks, MD Duration: 06/17/2022 1215 to 06/18/2022 1215 Patient history: 47 y.o. female with hx of cocaine use p/w unresponsive and large R IPH with brain compression and 5mm midline shift. EEG to evaluate for seizure Level of alertness: Awake, asleep AEDs during EEG study: None Technical aspects: This EEG study was done with scalp electrodes positioned according to the 10-20 International system of electrode placement. Electrical activity was reviewed with band pass filter of 1-70Hz , sensitivity  of 7 uV/mm, display speed of 23mm/sec with a  60Hz  notched filter applied as appropriate. EEG data were recorded continuously and digitally stored.  Video monitoring was available and reviewed as appropriate. Description: The posterior dominant rhythm consists of 7.5 Hz activity of moderate voltage (25-35 uV) seen predominantly in posterior head regions, symmetric and reactive to eye opening and eye closing. Sleep was characterized by vertex waves, sleep spindles (12 to 14 Hz), maximal frontocentral region. EEG showed continuous 3 to 6 Hz theta-delta slowing in right hemisphere, maximal right temporal region. Intermittent generalized 3-6hz  theta-delta slowing was also noted. Hyperventilation and photic stimulation were not performed.   ABNORMALITY - Continuous slow, right hemisphere, maximal right temporal region - Intermittent slow, generalized IMPRESSION: This study is suggestive of cortical dysfunction arising from right hemisphere, maximal right temporal region likely secondary to underlying structural abnormality. Additionally there is mild to moderate diffuse encephalopathy. No seizures or epileptiform discharges were seen throughout the recording. Charlsie Quest   DG Chest Port 1 View  Result Date: 06/18/2022 CLINICAL DATA:  Pulmonary infiltrates EXAM: PORTABLE CHEST 1 VIEW COMPARISON:  06/17/2022 FINDINGS: Endotracheal tube is in stable position. NG tube tip in the proximal stomach with the side port near the GE junction. The NG tube appears to be buckled/looped within the neck near the thoracic inlet. Heart mediastinal contours within normal limits. Right middle lobe airspace opacity again noted, stable. No confluent opacity on the left. No effusions or acute bony abnormality. IMPRESSION: Continued right middle lobe infiltrate, unchanged. NG tube appears to be buckled slashed looped in the upper esophagus near the thoracic inlet. Tip is in the proximal stomach. Electronically Signed   By: Charlett Nose  M.D.   On: 06/18/2022 07:11   DG Abd 1 View  Result Date: 06/17/2022 CLINICAL DATA:  Nasogastric tube placement. EXAM: ABDOMEN - 1 VIEW COMPARISON:  Radiograph earlier today. FINDINGS: Tip of the enteric tube is below the diaphragm in the stomach, the side port is just at the gastroesophageal junction. Normal upper abdominal bowel gas pattern. IMPRESSION: Tip of the enteric tube below the diaphragm in the stomach, side-port just at the gastroesophageal junction. Electronically Signed   By: Narda Rutherford M.D.   On: 06/17/2022 15:43   ECHOCARDIOGRAM COMPLETE  Result Date: 06/17/2022    ECHOCARDIOGRAM REPORT   Patient Name:   KIYLA POTTHAST Date of Exam: 06/17/2022 Medical Rec #:  [id number]          Height:       65.0 in Accession #:    [id number]         Weight:       140.0 lb Date of Birth:  1975/04/04          BSA:          1.700 m Patient Age:    46 years           BP:           122/111 mmHg Patient Gender: F                  HR:           102 bpm. Exam Location:  Inpatient Procedure: 2D Echo, Cardiac Doppler and Color Doppler Indications:    Stroke  History:        Patient has no prior history of Echocardiogram examinations.                 ICH, sepsis; Risk Factors:Current Smoker and Substance abuse.  Sonographer:    Wallie Char  Referring Phys: [id number] [facility]  Sonographer Comments: Technically challenging study due to limited acoustic windows and echo performed with patient supine and on artificial respirator. IMPRESSIONS  1. Left ventricular ejection fraction, by estimation, is 50 to 55%. The left ventricle has low normal function. The left ventricle has no regional wall motion abnormalities. There is moderate left ventricular hypertrophy. Left ventricular diastolic parameters are consistent with Grade I diastolic dysfunction (impaired relaxation).  2. Right ventricular systolic function is normal. The right ventricular size is normal. There is mildly elevated pulmonary artery  systolic pressure.  3. The mitral valve is normal in structure. Trivial mitral valve regurgitation. No evidence of mitral stenosis.  4. The aortic valve is tricuspid. Aortic valve regurgitation is not visualized. No aortic stenosis is present. FINDINGS  Left Ventricle: Left ventricular ejection fraction, by estimation, is 50 to 55%. The left ventricle has low normal function. The left ventricle has no regional wall motion abnormalities. The left ventricular internal cavity size was normal in size. There is moderate left ventricular hypertrophy. Left ventricular diastolic parameters are consistent with Grade I diastolic dysfunction (impaired relaxation). Right Ventricle: The right ventricular size is normal. No increase in right ventricular wall thickness. Right ventricular systolic function is normal. There is mildly elevated pulmonary artery systolic pressure. The tricuspid regurgitant velocity is 2.74  m/s, and with an assumed right atrial pressure of 8 mmHg, the estimated right ventricular systolic pressure is 38.0 mmHg. Left Atrium: Left atrial size was normal in size. Right Atrium: Right atrial size was normal in size. Pericardium: There is no evidence of pericardial effusion. Mitral Valve: The mitral valve is normal in structure. Trivial mitral valve regurgitation. No evidence of mitral valve stenosis. MV peak gradient, 5.3 mmHg. The mean mitral valve gradient is 2.0 mmHg. Tricuspid Valve: The tricuspid valve is normal in structure. Tricuspid valve regurgitation is trivial. Aortic Valve: The aortic valve is tricuspid. Aortic valve regurgitation is not visualized. No aortic stenosis is present. Aortic valve mean gradient measures 4.0 mmHg. Aortic valve peak gradient measures 6.2 mmHg. Aortic valve area, by VTI measures 2.39 cm. Pulmonic Valve: The pulmonic valve was normal in structure. Pulmonic valve regurgitation is not visualized. Aorta: The aortic root and ascending aorta are structurally normal, with no  evidence of dilitation. IAS/Shunts: The interatrial septum was not well visualized.  LEFT VENTRICLE PLAX 2D LVIDd:         4.50 cm     Diastology LVIDs:         3.30 cm     LV e' medial:    6.63 cm/s LV PW:         1.10 cm     LV E/e' medial:  11.0 LV IVS:        1.00 cm     LV e' lateral:   5.86 cm/s LVOT diam:     1.90 cm     LV E/e' lateral: 12.4 LV SV:         40 LV SV Index:   23 LVOT Area:     2.84 cm  LV Volumes (MOD) LV vol d, MOD A2C: 68.4 ml LV vol d, MOD A4C: 77.5 ml LV vol s, MOD A2C: 33.9 ml LV vol s, MOD A4C: 37.8 ml LV SV MOD A2C:     34.5 ml LV SV MOD A4C:     77.5 ml LV SV MOD BP:      39.2 ml RIGHT VENTRICLE  IVC RV S prime:     13.90 cm/s  IVC diam: 1.50 cm TAPSE (M-mode): 1.6 cm LEFT ATRIUM             Index        RIGHT ATRIUM           Index LA diam:        3.00 cm 1.76 cm/m   RA Area:     10.40 cm LA Vol (A2C):   23.2 ml 13.65 ml/m  RA Volume:   20.40 ml  12.00 ml/m LA Vol (A4C):   19.2 ml 11.29 ml/m LA Biplane Vol: 21.2 ml 12.47 ml/m  AORTIC VALVE AV Area (Vmax):    2.85 cm AV Area (Vmean):   2.41 cm AV Area (VTI):     2.39 cm AV Vmax:           124.00 cm/s AV Vmean:          93.450 cm/s AV VTI:            0.166 m AV Peak Grad:      6.2 mmHg AV Mean Grad:      4.0 mmHg LVOT Vmax:         124.50 cm/s LVOT Vmean:        79.500 cm/s LVOT VTI:          0.140 m LVOT/AV VTI ratio: 0.84  AORTA Ao Root diam: 3.30 cm Ao Asc diam:  2.80 cm MITRAL VALVE               TRICUSPID VALVE MV Area (PHT): 4.06 cm    TR Peak grad:   30.0 mmHg MV Area VTI:   1.72 cm    TR Vmax:        274.00 cm/s MV Peak grad:  5.3 mmHg MV Mean grad:  2.0 mmHg    SHUNTS MV Vmax:       1.15 m/s    Systemic VTI:  0.14 m MV Vmean:      68.2 cm/s   Systemic Diam: 1.90 cm MV Decel Time: 187 msec MV E velocity: 72.90 cm/s MV A velocity: 89.60 cm/s MV E/A ratio:  0.81 Epifanio Lesches MD Electronically signed by Epifanio Lesches MD Signature Date/Time: 06/17/2022/10:47:05 AM    Final    CT VENOGRAM  HEAD  Result Date: 06/17/2022 CLINICAL DATA:  Intracranial hemorrhage. EXAM: CT VENOGRAM HEAD TECHNIQUE: Venographic phase images of the brain were obtained following the administration of intravenous contrast. Multiplanar reformats and maximum intensity projections were generated. RADIATION DOSE REDUCTION: This exam was performed according to the departmental dose-optimization program which includes automated exposure control, adjustment of the mA and/or kV according to patient size and/or use of iterative reconstruction technique. CONTRAST:  75mL OMNIPAQUE IOHEXOL 350 MG/ML SOLN COMPARISON:  None Available. FINDINGS: Filling defect of the right transverse sinus and right transverse sigmoid junction which are somewhat large and tubular for arachnoid granulations, although an arachnoid granulation at the left transverse sigmoid junction appears nearly similar. The location would also not explain the acute hemorrhage. No visible cortical or central venous thrombosis. IMPRESSION: No cortical or central venous thrombosis to explain the ICH. There are filling defects at the right transverse and transverse sigmoid dural sinuses which are prominent for arachnoid granulations but not definitive for thrombus. Attention at follow-up imaging. Electronically Signed   By: Tiburcio Pea M.D.   On: 06/17/2022 05:30   CT ANGIO HEAD NECK W WO CM  Result Date: 06/17/2022  CLINICAL DATA:  Found unresponsive. Intracranial hemorrhage. History of cocaine use EXAM: CT ANGIOGRAPHY HEAD AND NECK WITH AND WITHOUT CONTRAST TECHNIQUE: Multidetector CT imaging of the head and neck was performed using the standard protocol during bolus administration of intravenous contrast. Multiplanar CT image reconstructions and MIPs were obtained to evaluate the vascular anatomy. Carotid stenosis measurements (when applicable) are obtained utilizing NASCET criteria, using the distal internal carotid diameter as the denominator. RADIATION DOSE  REDUCTION: This exam was performed according to the departmental dose-optimization program which includes automated exposure control, adjustment of the mA and/or kV according to patient size and/or use of iterative reconstruction technique. CONTRAST:  75mL OMNIPAQUE IOHEXOL 350 MG/ML SOLN COMPARISON:  Head CT from earlier today FINDINGS: CTA NECK FINDINGS Aortic arch: Unremarkable Right carotid system: Mixed density plaque at the bifurcation with some low-density plaque bulging into the ICA bulb. No ulceration or flow limiting stenosis Left carotid system: Mainly low-density plaque at the bifurcation without stenosis or ulceration. Vertebral arteries: No proximal subclavian stenosis. The vertebral arteries are somewhat tortuous but smoothly contoured and diffusely patent Skeleton: No acute or aggressive finding. Other neck: No acute finding Upper chest: Mild opacification of dependent lungs which could be atelectasis. Centrilobular emphysema. Review of the MIP images confirms the above findings CTA HEAD FINDINGS Anterior circulation: No aneurysm or spot sign seen underlying the ICH. There is a aneurysm projecting leftward from the left carotid terminus which measures 4 mm. No adjacent hemorrhage. No major branch occlusion. There is undulation of the bilateral intracranial branches which is likely atheromatous given findings in the neck. No segmental beading. Some less intense flow and right MCA branches is likely related to mass effect by the ICH. Posterior circulation: The vertebral and basilar arteries arediffusely patent. Undulation of the left more than right PCA with up to moderate narrowing on the left, likely atheromatous given the findings in the neck. Venous sinuses: Reference dedicated CT venogram Anatomic variants: No acute finding Review of the MIP images confirms the above findings IMPRESSION: 1. No vascular lesion or spot sign seen at the acute ICH. 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 3. 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 Electronically Signed   By: Tiburcio Pea M.D.   On: 06/17/2022 05:18   CT Head Wo Contrast  Result Date: 06/17/2022 CLINICAL DATA:  Altered mental status EXAM: CT HEAD WITHOUT CONTRAST TECHNIQUE: Contiguous axial images were obtained from the base of the skull through the vertex without intravenous contrast. RADIATION DOSE REDUCTION: This exam was performed according to the departmental dose-optimization program which includes automated exposure control, adjustment of the mA and/or kV according to patient size and/or use of iterative reconstruction technique. COMPARISON:  None Available. FINDINGS: Brain: There is a large area of parenchymal hemorrhage identified in the right cerebral hemisphere centered primarily within the basal ganglia and extending superiorly into the centrum semi ovale. This measures approximately 4.7 x 4.0 cm in greatest AP and transverse dimensions respectively. It extends for approximately 4.7 cm in craniocaudad dimension. Significant mass-effect upon the right lateral ventricle is noted. Mild midline shift of approximately 5 mm is noted. Some surrounding edema is seen as well. No other hemorrhage is noted. Relative loss of the sulcal markings is noted likely related to some increased intracranial pressure. Vascular: No hyperdense vessel or unexpected calcification. Skull: Normal. Negative for fracture or focal lesion. Sinuses/Orbits: No acute finding. Other: None. IMPRESSION: Right-sided intraparenchymal hemorrhage as described with associated edema and mild midline shift of 5 mm  from right to left. Critical Value/emergent results were called by telephone at the time of interpretation on 06/17/2022 at 2:27 am to Dr. Donna Bernard , who verbally acknowledged these results. Electronically Signed   By: Alcide Clever M.D.   On: 06/17/2022 02:29   DG Abdomen 1 View  Result Date: 06/17/2022 CLINICAL DATA:  Check gastric catheter  placement EXAM: ABDOMEN - 1 VIEW COMPARISON:  None Available. FINDINGS: Gastric catheter is noted extending into the stomach. Proximal side port lies at the gastroesophageal junction. This should be advanced further into the stomach. No free air is seen. No bony abnormality is noted. IMPRESSION: Gastric catheter as described. Electronically Signed   By: Alcide Clever M.D.   On: 06/17/2022 02:16   DG Chest Port 1 View  Result Date: 06/17/2022 CLINICAL DATA:  Check endotracheal tube placement EXAM: PORTABLE CHEST 1 VIEW COMPARISON:  10/21/2020 FINDINGS: Cardiac shadow is within normal limits. Endotracheal tube is noted 1.5 cm above the carina. Gastric catheter is noted extending into the stomach. Patchy increased density is noted in the right middle lobe beneath the minor fissure consistent with acute infiltrate. No sizable effusion is noted. No bony abnormality is seen. IMPRESSION: Right middle lobe infiltrate. Tubes and lines as described. Electronically Signed   By: Alcide Clever M.D.   On: 06/17/2022 02:16     PHYSICAL EXAM  Temp:  [98.6 F (37 C)-100.6 F (38.1 C)] 98.7 F (37.1 C) (05/12 1113) Pulse Rate:  [58-101] 58 (05/12 0800) Resp:  [13-22] 18 (05/12 0800) BP: (118-220)/(68-118) 153/77 (05/12 0800) SpO2:  [92 %-100 %] 92 % (05/12 1059) FiO2 (%):  [40 %-50 %] 40 % (05/12 1100) Weight:  [60.8 kg] 60.8 kg (05/12 0500)  General - Well nourished, well developed, intubated on sedation.  Ophthalmologic - fundi not visualized due to noncooperation.  Cardiovascular - Regular rate and rhythm.  Neuro - intubated on fentanyl, eyes slowly open to voice, not following commands. With eye opening, right pupil 1.5 mm and briskly reactive, left pupil 4 mm and very sluggishly reactive.  Corneal reflex present, cough present. Breathing over the vent.  Facial symmetry not able to test due to ET tube.  Tongue protrusion not cooperative. On pain stimulation, RUE and RLE do flicker, however left upper and  lower extremity remain flaccid. Sensation, coordination and gait not tested.   ASSESSMENT/PLAN Ms. LASHUN FREEMAN is a 47 y.o. female with history of cocaine abuse admitted for unresponsiveness.  Intubated in ER for airway protection.  No tPA given due to ICH.    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 Vasospasm from ICH vs. 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 Therapy recommendations: Pending Disposition: Pending, discussed with cousin and aunt at bedside, prognosis likely to be poor.  Cerebral edema CT right BG and CR large ICH, 5 mm midline shift CT repeat 5/12 stable ICH with leftward midline shift of 5 mm Na 151->155->157->159-> 155->158 Off 3% saline On free water 200 Q6 Na monitoring LTM EEG 5/12 cortical dysfunction arising from right hemisphere with diffuse encephalopathy, no seizures  Respiratory failure Aspiration pneumonia Leukocytosis Intubated on vent Still on fentanyl CCM on board CXR right middle lobe infiltrate On azithromycin and Rocephin Tmax 100.6->100.3 WBC 22.2->20.1  Hypertension BP unstable Off  cleviprex Labetalol and hydralazine IV PRN BP goal < 160 Long term BP goal normotensive  AKI Creatinine 2.15-1.32-1.03 On tube feeding and FW  UTI UA WBC 21-50 On Rocephin Urine culture + STREPTOCOCCUS AGALACTIAE   Cocaine abuse History of cocaine abuse UDS positive for cocaine Cessation education will be provided  Dysphagia On tube feeding  Other Stroke Risk Factors   Other Active Problems Cerebral aneurysm, CTA head and neck showed 4 mm left terminal ICA aneurysm, asymptomatic, outpatient follow-up  Hospital day # 2  Patient seen by NP and then by MD, MD to edit note is needed  Cortney E Ernestina Columbia , MSN, AGACNP-BC [facility] See Amion for schedule and pager information 06/19/2022 12:35 PM   ATTENDING NOTE: I reviewed above note and agree with the assessment and plan. Pt was seen and examined.   Patient cousin and aunt and CCM NP are at bedside.  Patient this morning found to have L pupil 3.27mm, R pupil 1.53mm, both sluggish to light.  However, patient 8 AM neurochecks showed pupil equal size.  MRI overnight showed stable right BG/CR ICH but developed right MCA scattered infarct and right sigmoid sinus and IJ thrombosis.  Patient had central line placement on the left yesterday.  Given pupil changes, stat CT again showed stable ICH with 5 mm midline shift and developing right MCA infarct.  Anisocoria likely due to right Horner sign.  Patient open eyes on voice, still has left upper gaze, left hemiplegia even with pain stimulation, mild withdrawal on the right with pain summation.  Long-term EEG no seizure.  Sodium 155-158, continue free water.  Urine culture positive and CXR showed pneumonia, continue Rocephin and azithromycin.  Still intubated, not candidate for extubation, vent management per CCM. I had long discussion with cousin and aunt at bedside, updated pt current condition, treatment plan and potential poor prognosis, and answered all the questions.   They expressed understanding and appreciation.   For detailed assessment and plan, please refer to above/below as I have made changes wherever appropriate.   Marvel Plan, MD PhD Stroke Neurology 06/19/2022 6:30 PM  This patient is critically ill due to large ICH, stroke, dural venous sinus thrombosis, cerebral edema, cocaine abuse, AKI, pneumonia, UTI and at significant risk of neurological worsening, death form reherniation, brain death, seizure, sepsis. This patient's care requires constant monitoring of vital signs, hemodynamics, respiratory and cardiac monitoring, review of multiple databases, neurological assessment, discussion with family, other specialists and medical decision making of high complexity. I spent 40 minutes of neurocritical care time in the care of this patient.  I discussed with CCM NP.  To contact Stroke Continuity provider, please refer to [email or web address]. After hours, contact General Neurology

## 2022-06-19 NOTE — Progress Notes (Signed)
ED Antimicrobial Stewardship Positive Culture Follow Up   Rebecca [name] is an 47 y.o. female who presented to [facility] on 06/17/2022 with a chief complaint of No chief complaint on file.   Recent Results (from the past 720 hour(s))  Urine Culture     Status: Abnormal   Collection Time: 06/17/22  2:06 AM   Specimen: Urine, Random  Result Value Ref Range Status   Specimen Description   Final    URINE, RANDOM Performed at [facility], [address], [place], [place] [address]    Special Requests   Final    NONE Reflexed from [phone] Performed at [facility], [address] [place], [place] [address]    Culture (A)  Final    >=100,000 COLONIES/mL STREPTOCOCCUS AGALACTIAE TESTING AGAINST S. AGALACTIAE NOT ROUTINELY PERFORMED DUE TO PREDICTABILITY OF AMP/PEN/VAN SUSCEPTIBILITY. Performed at [facility], [address], [place], [place] [address]    Report Status 06/18/2022 FINAL  Final  MRSA Next Gen by PCR, Nasal     Status: Abnormal   Collection Time: 06/17/22  8:20 AM   Specimen: Nasal Mucosa; Nasal Swab  Result Value Ref Range Status   MRSA by PCR Next Gen DETECTED (A) NOT DETECTED Final    Comment: RESULT CALLED TO, READ BACK BY AND VERIFIED WITH: RN T CRITE [id number] AT 956 AM BY CM (NOTE) The GeneXpert MRSA Assay (FDA approved for NASAL specimens only), is one component of a comprehensive MRSA colonization surveillance program. It is not intended to diagnose MRSA infection nor to guide or monitor treatment for MRSA infections. Test performance is not FDA approved in patients less than 18 years old. Performed at [facility], [address], [place], [place] [address]   Culture, Respiratory w Gram Stain     Status: None (Preliminary result)   Collection Time: 06/18/22  3:27 PM   Specimen: Tracheal Aspirate; Respiratory  Result Value Ref Range Status   Specimen Description TRACHEAL ASPIRATE  Final   Special Requests NONE  Final    Gram Stain   Final    RARE WBC PRESENT, PREDOMINANTLY PMN NO ORGANISMS SEEN    Culture   Final    CULTURE REINCUBATED FOR BETTER GROWTH Performed at [facility], [address] [place], [place] [address]    Report Status PENDING  Incomplete     Patient initially presented at [facility] ED. Transferred to [facility] 06/17/19  Currently on Ceftriaxone/Azithromycin for PNA   Siddh Vandeventer A 06/19/2022, 2:58 PM Clinical Pharmacist

## 2022-06-19 NOTE — Progress Notes (Signed)
EEG tech went into pt room to plug back in head box after pt returned from MRI. Tech asked RN if the head box went to MRI, because It is not suppose to, as it is not MRI compatible and RN said " I don't know, it was not my shift". Head box is plugged back in and study is up and running. Not sure why the entire head box was sent to MRI, the pt has on MRI safe leads and typically the RN staff knows to unplug cables at the top of pt's head, bundle in order, 1, 2, 3, and leave the actual head box in room until pt returns.

## 2022-06-19 NOTE — Plan of Care (Signed)
  Problem: Education: Goal: Knowledge of General Education information will improve Description: Including pain rating scale, medication(s)/side effects and non-pharmacologic comfort measures Outcome: Progressing   Problem: Health Behavior/Discharge Planning: Goal: Ability to manage health-related needs will improve Outcome: Progressing   Problem: Clinical Measurements: Goal: Ability to maintain clinical measurements within normal limits will improve Outcome: Progressing Goal: Will remain free from infection Outcome: Progressing Goal: Diagnostic test results will improve Outcome: Progressing Goal: Respiratory complications will improve Outcome: Progressing Goal: Cardiovascular complication will be avoided Outcome: Progressing   Problem: Activity: Goal: Risk for activity intolerance will decrease Outcome: Progressing   Problem: Nutrition: Goal: Adequate nutrition will be maintained Outcome: Progressing   Problem: Coping: Goal: Level of anxiety will decrease Outcome: Progressing   Problem: Elimination: Goal: Will not experience complications related to bowel motility Outcome: Progressing Goal: Will not experience complications related to urinary retention Outcome: Progressing   Problem: Pain Managment: Goal: General experience of comfort will improve Outcome: Progressing   Problem: Safety: Goal: Ability to remain free from injury will improve Outcome: Progressing   Problem: Skin Integrity: Goal: Risk for impaired skin integrity will decrease Outcome: Progressing   Problem: Education: Goal: Knowledge of disease or condition will improve Outcome: Progressing Goal: Knowledge of secondary prevention will improve (MUST DOCUMENT ALL) Outcome: Progressing Goal: Knowledge of patient specific risk factors will improve (Mark N/A or DELETE if not current risk factor) Outcome: Progressing   Problem: Intracerebral Hemorrhage Tissue Perfusion: Goal: Complications of  Intracerebral Hemorrhage will be minimized Outcome: Progressing   Problem: Coping: Goal: Will verbalize positive feelings about self Outcome: Progressing Goal: Will identify appropriate support needs Outcome: Progressing   Problem: Health Behavior/Discharge Planning: Goal: Ability to manage health-related needs will improve Outcome: Progressing Goal: Goals will be collaboratively established with patient/family Outcome: Progressing   Problem: Self-Care: Goal: Ability to participate in self-care as condition permits will improve Outcome: Progressing Goal: Verbalization of feelings and concerns over difficulty with self-care will improve Outcome: Progressing Goal: Ability to communicate needs accurately will improve Outcome: Progressing   Problem: Nutrition: Goal: Risk of aspiration will decrease Outcome: Progressing Goal: Dietary intake will improve Outcome: Progressing   

## 2022-06-19 NOTE — Progress Notes (Signed)
Transported patient to MRI while patient was on the ventilator. Patient remained stable during transport. 

## 2022-06-19 NOTE — Progress Notes (Addendum)
PT Cancellation Note  Patient Details Name: VEONICA TIERCE MRN: [id number] DOB: Mar 21, 1975   Cancelled Treatment:    Reason Eval/Treat Not Completed: (P) Active bedrest order. Coordinated with RN to notify PT if pt's bedrest order is discharged and if pt is appropriate for PT Eval this date. Will plan to follow-up later as time permits.  Addendum 13:35 - Discussed pt's case with RN, who reported pt is not medically stable at this time. Will plan to follow-up another day as able.   Raymond Gurney, PT, DPT Acute Rehabilitation Services  Office: [phone]   Jewel Baize 06/19/2022, 9:57 AM

## 2022-06-20 ENCOUNTER — Inpatient Hospital Stay (HOSPITAL_COMMUNITY): Payer: Medicaid Other

## 2022-06-20 DIAGNOSIS — J9602 Acute respiratory failure with hypercapnia: Secondary | ICD-10-CM | POA: Diagnosis not present

## 2022-06-20 DIAGNOSIS — I610 Nontraumatic intracerebral hemorrhage in hemisphere, subcortical: Secondary | ICD-10-CM | POA: Diagnosis not present

## 2022-06-20 DIAGNOSIS — Z9689 Presence of other specified functional implants: Secondary | ICD-10-CM | POA: Diagnosis not present

## 2022-06-20 DIAGNOSIS — F1410 Cocaine abuse, uncomplicated: Secondary | ICD-10-CM | POA: Diagnosis not present

## 2022-06-20 DIAGNOSIS — I953 Hypotension of hemodialysis: Secondary | ICD-10-CM

## 2022-06-20 DIAGNOSIS — J9601 Acute respiratory failure with hypoxia: Secondary | ICD-10-CM | POA: Diagnosis not present

## 2022-06-20 DIAGNOSIS — R569 Unspecified convulsions: Secondary | ICD-10-CM | POA: Diagnosis not present

## 2022-06-20 LAB — GLUCOSE, CAPILLARY
Glucose-Capillary: 112 mg/dL — ABNORMAL HIGH (ref 70–99)
Glucose-Capillary: 146 mg/dL — ABNORMAL HIGH (ref 70–99)
Glucose-Capillary: 151 mg/dL — ABNORMAL HIGH (ref 70–99)
Glucose-Capillary: 124 mg/dL — ABNORMAL HIGH (ref 70–99)
Glucose-Capillary: 124 mg/dL — ABNORMAL HIGH (ref 70–99)
Glucose-Capillary: 130 mg/dL — ABNORMAL HIGH (ref 70–99)

## 2022-06-20 LAB — CBC
HCT: 31.5 % — ABNORMAL LOW (ref 36.0–46.0)
Hemoglobin: 9.3 g/dL — ABNORMAL LOW (ref 12.0–15.0)
MCH: 21.7 pg — ABNORMAL LOW (ref 26.0–34.0)
MCHC: 29.5 g/dL — ABNORMAL LOW (ref 30.0–36.0)
MCV: 73.6 fL — ABNORMAL LOW (ref 80.0–100.0)
Platelets: 300 10*3/uL (ref 150–400)
RBC: 4.28 MIL/uL (ref 3.87–5.11)
RDW: 19.9 % — ABNORMAL HIGH (ref 11.5–15.5)
WBC: 15.8 10*3/uL — ABNORMAL HIGH (ref 4.0–10.5)
nRBC: 0.1 % (ref 0.0–0.2)

## 2022-06-20 LAB — BASIC METABOLIC PANEL
Anion gap: 4 — ABNORMAL LOW (ref 5–15)
BUN: 21 mg/dL — ABNORMAL HIGH (ref 6–20)
CO2: 21 mmol/L — ABNORMAL LOW (ref 22–32)
Calcium: 9 mg/dL (ref 8.9–10.3)
Chloride: 125 mmol/L — ABNORMAL HIGH (ref 98–111)
Creatinine, Ser: 0.73 mg/dL (ref 0.44–1.00)
GFR, Estimated: >60 mL/min (ref >60)
Glucose, Bld: 153 mg/dL — ABNORMAL HIGH (ref 70–99)
Potassium: 3.3 mmol/L — ABNORMAL LOW (ref 3.5–5.1)
Sodium: 150 mmol/L — ABNORMAL HIGH (ref 135–145)

## 2022-06-20 LAB — SODIUM
Sodium: 155 mmol/L — ABNORMAL HIGH (ref 135–145)
Sodium: 160 mmol/L — ABNORMAL HIGH (ref 135–145)
Sodium: 160 mmol/L — ABNORMAL HIGH (ref 135–145)

## 2022-06-20 LAB — CULTURE, BLOOD (ROUTINE X 2): Special Requests: ADEQUATE

## 2022-06-20 MED ORDER — SODIUM CHLORIDE 0.9% FLUSH
10.0000 mL | Freq: Three times a day (TID) | INTRAVENOUS | Status: DC
  Administered 2022-06-20 – 2022-07-01 (×32): 10 mL via INTRAPLEURAL

## 2022-06-20 MED ORDER — AMLODIPINE BESYLATE 10 MG PO TABS
10.0000 mg | ORAL_TABLET | Freq: Every day | ORAL | Status: DC
  Administered 2022-06-20 – 2022-07-01 (×11): 10 mg
  Filled 2022-06-20 (×11): qty 1

## 2022-06-20 MED ORDER — LABETALOL HCL 5 MG/ML IV SOLN
10.0000 mg | Freq: Once | INTRAVENOUS | Status: AC
  Administered 2022-06-20: 10 mg via INTRAVENOUS
  Filled 2022-06-20: qty 4

## 2022-06-20 MED ORDER — MUPIROCIN 2 % EX OINT
1.0000 | TOPICAL_OINTMENT | Freq: Two times a day (BID) | CUTANEOUS | Status: AC
  Administered 2022-06-20 – 2022-06-24 (×10): 1 via NASAL
  Filled 2022-06-20: qty 22

## 2022-06-20 MED ORDER — ATROPINE SULFATE 1 MG/10ML IJ SOSY: 0.5000 mg | PREFILLED_SYRINGE | Freq: Once | INTRAMUSCULAR | Status: DC | PRN

## 2022-06-20 MED ORDER — CLEVIDIPINE BUTYRATE 0.5 MG/ML IV EMUL
0.0000 mg/h | INTRAVENOUS | Status: DC
  Administered 2022-06-20: 2 mg/h via INTRAVENOUS
  Administered 2022-06-21: 4 mg/h via INTRAVENOUS
  Filled 2022-06-20 (×3): qty 100

## 2022-06-20 MED ORDER — IPRATROPIUM-ALBUTEROL 0.5-2.5 (3) MG/3ML IN SOLN
3.0000 mL | RESPIRATORY_TRACT | Status: DC
  Administered 2022-06-20 – 2022-06-24 (×24): 3 mL via RESPIRATORY_TRACT
  Filled 2022-06-20 (×21): qty 3

## 2022-06-20 MED ORDER — SODIUM CHLORIDE 3 % IV SOLN
INTRAVENOUS | Status: DC
  Filled 2022-06-20 (×3): qty 500

## 2022-06-20 MED ORDER — FUROSEMIDE 10 MG/ML IJ SOLN
40.0000 mg | Freq: Once | INTRAMUSCULAR | Status: AC
  Administered 2022-06-20: 40 mg via INTRAVENOUS
  Filled 2022-06-20: qty 4

## 2022-06-20 MED ORDER — MIDAZOLAM HCL 2 MG/2ML IJ SOLN
INTRAMUSCULAR | Status: AC
  Filled 2022-06-20: qty 2

## 2022-06-20 MED ORDER — POTASSIUM CHLORIDE 20 MEQ PO PACK
40.0000 meq | PACK | Freq: Once | ORAL | Status: AC
  Administered 2022-06-20: 40 meq
  Filled 2022-06-20: qty 2

## 2022-06-20 MED ORDER — FREE WATER
200.0000 mL | Freq: Four times a day (QID) | Status: DC
  Administered 2022-06-20 – 2022-06-21 (×3): 200 mL

## 2022-06-20 MED ORDER — SODIUM CHLORIDE 0.9 % IV SOLN
3.0000 g | Freq: Four times a day (QID) | INTRAVENOUS | Status: DC
  Administered 2022-06-20 – 2022-06-26 (×25): 3 g via INTRAVENOUS
  Filled 2022-06-20 (×25): qty 8

## 2022-06-20 MED ORDER — MIDAZOLAM HCL 2 MG/2ML IJ SOLN
1.0000 mg | INTRAMUSCULAR | Status: DC | PRN
  Administered 2022-06-20 – 2022-06-25 (×6): 2 mg via INTRAVENOUS
  Filled 2022-06-20 (×5): qty 2

## 2022-06-20 MED ORDER — SODIUM CHLORIDE 0.9 % IV SOLN: INTRAVENOUS | Status: DC | PRN

## 2022-06-20 MED ORDER — MIDAZOLAM HCL 2 MG/2ML IJ SOLN
4.0000 mg | Freq: Once | INTRAMUSCULAR | Status: AC
  Administered 2022-06-20: 4 mg via INTRAVENOUS
  Filled 2022-06-20: qty 4

## 2022-06-20 NOTE — TOC Initial Note (Addendum)
  Transition of Care [facility]) Screening Note   Patient Details  Name: Rebecca [name] Date of Birth: September 03, 1975   Transition of Care [facility]) CM/SW Contact:    Inis Sizer, LCSW Phone Number: 06/20/2022, 9:58 AM   CSW has reviewed patient and patient is currently intubated and sedated. Patient's medical work up is on going at this time. MD to place palliative consult for GOC discussion as patient's cousin reached out to [facility] herself.   TOC will continue to monitor patient advancement through interdisciplinary progression rounds.  Edwin Dada, MSW, LCSW Transitions of Care  Clinical Social Worker II [phone]

## 2022-06-20 NOTE — Progress Notes (Signed)
STROKE TEAM PROGRESS NOTE   SUBJECTIVE (INTERVAL HISTORY) No family at the bedside. Per RN, pt this morning started to have tachycardia, elevated BP, fighting with the vent, question of aspiration. She was given versed for sedation and put back on cleviprex. On my exam, pt not responsive, left pupil still dilated with no light reaction. Right pupil small. EEG no seizure, LTM EEG removed. CT repeat stable with 5mm MLS as well extensive stroke on the right, no significant change from MRI yesterday. Na 150, put back on 3% saline.    OBJECTIVE Temp:  [95.5 F (35.3 C)-100.4 F (38 C)] 99 F (37.2 C) (05/13 1300) Pulse Rate:  [46-125] 120 (05/13 1300) Cardiac Rhythm: Normal sinus rhythm;Sinus bradycardia (05/13 0800) Resp:  [18-33] 33 (05/13 1300) BP: (119-215)/(63-113) 139/90 (05/13 1300) SpO2:  [84 %-100 %] 94 % (05/13 1300) FiO2 (%):  [40 %-100 %] 70 % (05/13 1210) Weight:  [60 kg] 60 kg (05/13 0700)  Recent Labs  Lab 06/19/22 1951 06/19/22 2346 06/20/22 0342 06/20/22 0713 06/20/22 1125  GLUCAP 127* 126* 112* 146* 151*   Recent Labs  Lab 06/17/22 0759 06/17/22 0922 06/17/22 2310 06/18/22 0242 06/18/22 0616 06/18/22 0915 06/18/22 1441 06/19/22 0301 06/19/22 0842 06/19/22 1500 06/19/22 1918 06/20/22 0403  NA 144   < > 157* 157* 157* 157*   < > 155* 155* 158* 155* 155*  150*  K 3.2*   < > 4.6 4.2 4.9 4.0  --   --   --   --   --  3.3*  CL 112*   < > >130* >130* >130* 129*  --   --   --   --   --  125*  CO2 21*   < > 20* 20* 17* 20*  --   --   --   --   --  21*  GLUCOSE 106*   < > 116* 104* 125* 132*  --   --   --   --   --  153*  BUN 39*   < > 46* 39* 38* 32*  --   --   --   --   --  21*  CREATININE 2.15*   < > 1.26* 1.13* 1.16* 1.03*  --   --   --   --   --  0.73  CALCIUM 8.6*   < > 8.7* 8.8* 9.2 8.7*  --   --   --   --   --  9.0  MG 2.0  --   --  2.3  --   --   --   --   --   --   --   --   PHOS  --   --   --  2.6  --   --   --   --   --   --   --   --    < > = values  in this interval not displayed.   Recent Labs  Lab 06/17/22 0145  AST 29  ALT 14  ALKPHOS 78  BILITOT 1.5*  PROT 8.9*  ALBUMIN 3.8   Recent Labs  Lab 06/17/22 0145 06/17/22 0922 06/18/22 0242 06/20/22 0403  WBC 22.2*  --  20.1* 15.8*  NEUTROABS 19.9*  --   --   --   HGB 13.3 11.2* 9.3* 9.3*  HCT 43.6 33.0* 32.2* 31.5*  MCV 71.9*  --  75.8* 73.6*  PLT 432*  --  284 300   Recent  Labs  Lab 06/17/22 0759  CKTOTAL 67   No results for input(s): "LABPROT", "INR" in the last 72 hours. No results for input(s): "COLORURINE", "LABSPEC", "PHURINE", "GLUCOSEU", "HGBUR", "BILIRUBINUR", "KETONESUR", "PROTEINUR", "UROBILINOGEN", "NITRITE", "LEUKOCYTESUR" in the last 72 hours.  Invalid input(s): "APPERANCEUR"     Component Value Date/Time   CHOL 126 06/18/2022 0242   TRIG 78 06/18/2022 0242   HDL 45 06/18/2022 0242   CHOLHDL 2.8 06/18/2022 0242   VLDL 16 06/18/2022 0242   LDLCALC 65 06/18/2022 0242   Lab Results  Component Value Date   HGBA1C 5.9 (H) 06/18/2022      Component Value Date/Time   LABOPIA NONE DETECTED 06/17/2022 0206   COCAINSCRNUR POSITIVE (A) 06/17/2022 0206   LABBENZ NONE DETECTED 06/17/2022 0206   AMPHETMU NONE DETECTED 06/17/2022 0206   THCU NONE DETECTED 06/17/2022 0206   LABBARB NONE DETECTED 06/17/2022 0206    Recent Labs  Lab 06/17/22 0145  ETH <10    I have personally reviewed the radiological images below and agree with the radiology interpretations.  CT HEAD WO CONTRAST ( )  Result Date: 06/20/2022 CLINICAL DATA:  Neuro deficit with acute stroke suspected EXAM: CT HEAD WITHOUT CONTRAST TECHNIQUE: Contiguous axial images were obtained from the base of the skull through the vertex without intravenous contrast. RADIATION DOSE REDUCTION: This exam was performed according to the departmental dose-optimization program which includes automated exposure control, adjustment of the mA and/or kV according to patient size and/or use of iterative  reconstruction technique. COMPARISON:  Yesterday FINDINGS: Brain: Large ICH centered at the right basal ganglia and adjacent white matter, with tracking towards the right brainstem is unchanged in shape and size, measuring up to 5 cm anterior to posterior and 4 cm craniocaudal. Regional edema is unchanged. Extensive cortical infarct in the right posterior frontal and parietal lobe. Smaller but more apparent left parietal cortex infarcts since brain MRI yesterday. Midline shift is 5 mm. Vascular: No hyperdense vessel or unexpected calcification. Skull: Normal. Negative for fracture or focal lesion. Sinuses/Orbits: No acute finding. IMPRESSION: 1. Multiple infarcts by brain MRI yesterday, suspect progressive cortex infarction in the left parietal lobe. 2. Unchanged appearance of right ICH with adjacent edema. Midline shift is 5 mm. Electronically Signed   By: Tiburcio Pea M.D.   On: 06/20/2022 06:09   CT HEAD WO CONTRAST ( )  Result Date: 06/19/2022 CLINICAL DATA:  Follow-up ICH EXAM: CT HEAD WITHOUT CONTRAST TECHNIQUE: Contiguous axial images were obtained from the base of the skull through the vertex without intravenous contrast. RADIATION DOSE REDUCTION: This exam was performed according to the departmental dose-optimization program which includes automated exposure control, adjustment of the mA and/or kV according to patient size and/or use of iterative reconstruction technique. COMPARISON:  MRI from earlier today. FINDINGS: Brain: Large acute hemorrhage centered at the right basal ganglia and adjacent white matter without progression in size or shape when compared to prior. Cytotoxic edema most apparent in the right frontal parietal cortex, infarcts being under appreciated when compared to preceding brain MRI. Diffuse effacement of subarachnoid spaces, leftward midline shift of 5 mm. Unchanged mild dilatation of the left lateral ventricle. Vascular: No hyperdense vessel or unexpected calcification.  Skull: Normal. Negative for fracture or focal lesion. Sinuses/Orbits: No acute finding. IMPRESSION: Right ICH and extensive cortical infarction that is stable from brain MRI earlier today. No new abnormality. Leftward midline shift of 5 mm. Electronically Signed   By: Tiburcio Pea M.D.   On: 06/19/2022 11:13   MR BRAIN  W WO CONTRAST  Result Date: 06/19/2022 CLINICAL DATA:  Hemorrhagic stroke workup EXAM: MRI HEAD WITHOUT AND WITH CONTRAST TECHNIQUE: Multiplanar, multiecho pulse sequences of the brain and surrounding structures were obtained without and with intravenous contrast. CONTRAST:  6mL GADAVIST GADOBUTROL 1 MMOL/ML IV SOLN COMPARISON:  Head CT and CTA from 2 days prior FINDINGS: Brain: Multiple areas of restricted diffusion most confluent in the right parietal and posterior temporal cortex. Restricted diffusion is seen surrounding the large and known right basal ganglia hemorrhage which measures up to 5.3 cm anterior to posterior with cleft of fluid surrounding the hematoma. Infarcts in other arterial distributions are also present, including along the left cerebral convexity (especially parietooccipital)and at the right brainstem. Midline shift measures 5 mm. There is periventricular FLAIR hyperintensity suggesting chronic small vessel disease. There is asymmetric mild dilatation of the left temporal horn. Vascular: Filling defect in the right sigmoid sinus and upper internal jugular vein. Skull and upper cervical spine: Normal marrow signal Sinuses/Orbits: Mild mucosal thickening in the mastoid air cells. Retention cysts in the right maxillary sinus. Diffuse mucosal thickening in the paranasal sinuses. Negative orbits. IMPRESSION: 1. 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 The most confluent area is in the right MCA territory with infarct likely underlying the right basal ganglia hematoma. 2. 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 The pattern of infarcts and hemorrhage do not correlate with the clot location however. 3. Mild dilatation of the temporal horn of the left lateral ventricle. 5 mm of midline shift. Electronically Signed   By: Tiburcio Pea M.D.   On: 06/19/2022 06:59   DG Abd 1 View  Result Date: 06/18/2022 CLINICAL DATA:  Check gastric catheter placement EXAM: ABDOMEN - 1 VIEW COMPARISON:  Film from earlier in the same day. FINDINGS: Gastric catheter has been advanced and now lies completely within the stomach. Scattered large and small bowel gas is noted. IMPRESSION: Gastric catheter within the stomach as described. Electronically Signed   By: Alcide Clever M.D.   On: 06/18/2022 23:47   DG Abd 1 View  Result Date: 06/18/2022 CLINICAL DATA:  Feeding tube placement. EXAM: ABDOMEN - 1 VIEW COMPARISON:  Jun 18, 2022 (6:44 p.m.) FINDINGS: A nasogastric tube is seen with its distal tip overlying the expected region of the body of the stomach. The distal side hole sits at the level of the gastroesophageal junction. The bowel gas pattern is normal. No radio-opaque calculi or other significant radiographic abnormality are seen. IMPRESSION: Nasogastric tube positioning, as described above. Further advancement of the NG tube by approximately 7 cm is recommended to decrease the risk of aspiration. Electronically Signed   By: Aram Candela M.D.   On: 06/18/2022 22:23   DG Abd Portable 1V  Result Date: 06/18/2022 CLINICAL DATA:  Check gastric catheter placement EXAM: PORTABLE ABDOMEN - 1 VIEW COMPARISON:  None Available. FINDINGS: Scattered large and small bowel gas is noted. Gastric catheter is noted with the tip in the stomach. Proximal side port lies in the distal esophagus. This should be advanced several cm deeper into the stomach. IMPRESSION: Gastric catheter as described. This should be advanced deeper into the stomach. Electronically Signed   By: Alcide Clever M.D.   On: 06/18/2022  21:24   DG CHEST PORT 1 VIEW  Result Date: 06/18/2022 CLINICAL DATA:  Evaluate central line placement EXAM: PORTABLE CHEST 1 VIEW COMPARISON:  Jun 18, 2022 FINDINGS: The side  port of the NG tube is near the GE junction. The distal tip is in the stomach. The ETT is in good position. A left central line is been placed in the interval terminating in the central SVC. No pneumothorax. The left lung is clear. Right middle lobe infiltrate remains. No other interval changes. IMPRESSION: 1. Support apparatus as above. The side port of the NG tube is near the GE junction. Recommend advancing the tube. 2. The left lung is clear. Right middle lobe infiltrate remains. 3. No other interval changes. Electronically Signed   By: Gerome Sam III M.D.   On: 06/18/2022 17:20   Overnight EEG with video  Result Date: 06/18/2022 Charlsie Quest, MD     06/19/2022  9:37 AM Patient Name: Rebecca [name] MRN: [id number] Epilepsy Attending: Charlsie Quest Referring Physician/Provider: Erick Blinks, MD Duration: 06/17/2022 1215 to 06/18/2022 1215 Patient history: 47 y.o. female with hx of cocaine use p/w unresponsive and large R IPH with brain compression and 5mm midline shift. EEG to evaluate for seizure Level of alertness: Awake, asleep AEDs during EEG study: None Technical aspects: This EEG study was done with scalp electrodes positioned according to the 10-20 International system of electrode placement. Electrical activity was reviewed with band pass filter of 1-70Hz , sensitivity of 7 uV/mm, display speed of 48mm/sec with a 60Hz  notched filter applied as appropriate. EEG data were recorded continuously and digitally stored.  Video monitoring was available and reviewed as appropriate. Description: The posterior dominant rhythm consists of 7.5 Hz activity of moderate voltage (25-35 uV) seen predominantly in posterior head regions, symmetric and reactive to eye opening and eye closing. Sleep was characterized by vertex  waves, sleep spindles (12 to 14 Hz), maximal frontocentral region. EEG showed continuous 3 to 6 Hz theta-delta slowing in right hemisphere, maximal right temporal region. Intermittent generalized 3-6hz  theta-delta slowing was also noted. Hyperventilation and photic stimulation were not performed.   ABNORMALITY - Continuous slow, right hemisphere, maximal right temporal region - Intermittent slow, generalized IMPRESSION: This study is suggestive of cortical dysfunction arising from right hemisphere, maximal right temporal region likely secondary to underlying structural abnormality. Additionally there is mild to moderate diffuse encephalopathy. No seizures or epileptiform discharges were seen throughout the recording. Charlsie Quest   DG Chest Port 1 View  Result Date: 06/18/2022 CLINICAL DATA:  Pulmonary infiltrates EXAM: PORTABLE CHEST 1 VIEW COMPARISON:  06/17/2022 FINDINGS: Endotracheal tube is in stable position. NG tube tip in the proximal stomach with the side port near the GE junction. The NG tube appears to be buckled/looped within the neck near the thoracic inlet. Heart mediastinal contours within normal limits. Right middle lobe airspace opacity again noted, stable. No confluent opacity on the left. No effusions or acute bony abnormality. IMPRESSION: Continued right middle lobe infiltrate, unchanged. NG tube appears to be buckled slashed looped in the upper esophagus near the thoracic inlet. Tip is in the proximal stomach. Electronically Signed   By: Charlett Nose M.D.   On: 06/18/2022 07:11   DG Abd 1 View  Result Date: 06/17/2022 CLINICAL DATA:  Nasogastric tube placement. EXAM: ABDOMEN - 1 VIEW COMPARISON:  Radiograph earlier today. FINDINGS: Tip of the enteric tube is below the diaphragm in the stomach, the side port is just at the gastroesophageal junction. Normal upper abdominal bowel gas pattern. IMPRESSION: Tip of the enteric tube below the diaphragm in the stomach, side-port just at the  gastroesophageal junction. Electronically Signed   By: Ivette Loyal.D.  On: 06/17/2022 15:43   ECHOCARDIOGRAM COMPLETE  Result Date: 06/17/2022    ECHOCARDIOGRAM REPORT   Patient Name:   Rebecca [name] Date of Exam: 06/17/2022 Medical Rec #:  [id number]          Height:       65.0 in Accession #:    [id number]         Weight:       140.0 lb Date of Birth:  1975/09/16          BSA:          1.700 m Patient Age:    46 years           BP:           122/111 mmHg Patient Gender: F                  HR:           102 bpm. Exam Location:  Inpatient Procedure: 2D Echo, Cardiac Doppler and Color Doppler Indications:    Stroke  History:        Patient has no prior history of Echocardiogram examinations.                 ICH, sepsis; Risk Factors:Current Smoker and Substance abuse.  Sonographer:    Wallie Char Referring Phys: [id number] [facility]  Sonographer Comments: Technically challenging study due to limited acoustic windows and echo performed with patient supine and on artificial respirator. IMPRESSIONS  1. Left ventricular ejection fraction, by estimation, is 50 to 55%. The left ventricle has low normal function. The left ventricle has no regional wall motion abnormalities. There is moderate left ventricular hypertrophy. Left ventricular diastolic parameters are consistent with Grade I diastolic dysfunction (impaired relaxation).  2. Right ventricular systolic function is normal. The right ventricular size is normal. There is mildly elevated pulmonary artery systolic pressure.  3. The mitral valve is normal in structure. Trivial mitral valve regurgitation. No evidence of mitral stenosis.  4. The aortic valve is tricuspid. Aortic valve regurgitation is not visualized. No aortic stenosis is present. FINDINGS  Left Ventricle: Left ventricular ejection fraction, by estimation, is 50 to 55%. The left ventricle has low normal function. The left ventricle has no regional wall motion abnormalities. The left  ventricular internal cavity size was normal in size. There is moderate left ventricular hypertrophy. Left ventricular diastolic parameters are consistent with Grade I diastolic dysfunction (impaired relaxation). Right Ventricle: The right ventricular size is normal. No increase in right ventricular wall thickness. Right ventricular systolic function is normal. There is mildly elevated pulmonary artery systolic pressure. The tricuspid regurgitant velocity is 2.74  m/s, and with an assumed right atrial pressure of 8 mmHg, the estimated right ventricular systolic pressure is 38.0 mmHg. Left Atrium: Left atrial size was normal in size. Right Atrium: Right atrial size was normal in size. Pericardium: There is no evidence of pericardial effusion. Mitral Valve: The mitral valve is normal in structure. Trivial mitral valve regurgitation. No evidence of mitral valve stenosis. MV peak gradient, 5.3 mmHg. The mean mitral valve gradient is 2.0 mmHg. Tricuspid Valve: The tricuspid valve is normal in structure. Tricuspid valve regurgitation is trivial. Aortic Valve: The aortic valve is tricuspid. Aortic valve regurgitation is not visualized. No aortic stenosis is present. Aortic valve mean gradient measures 4.0 mmHg. Aortic valve peak gradient measures 6.2 mmHg. Aortic valve area, by VTI measures 2.39 cm. Pulmonic Valve: The pulmonic valve was normal in structure. Pulmonic valve  regurgitation is not visualized. Aorta: The aortic root and ascending aorta are structurally normal, with no evidence of dilitation. IAS/Shunts: The interatrial septum was not well visualized.  LEFT VENTRICLE PLAX 2D LVIDd:         4.50 cm     Diastology LVIDs:         3.30 cm     LV e' medial:    6.63 cm/s LV PW:         1.10 cm     LV E/e' medial:  11.0 LV IVS:        1.00 cm     LV e' lateral:   5.86 cm/s LVOT diam:     1.90 cm     LV E/e' lateral: 12.4 LV SV:         40 LV SV Index:   23 LVOT Area:     2.84 cm  LV Volumes (MOD) LV vol d, MOD A2C:  68.4 ml LV vol d, MOD A4C: 77.5 ml LV vol s, MOD A2C: 33.9 ml LV vol s, MOD A4C: 37.8 ml LV SV MOD A2C:     34.5 ml LV SV MOD A4C:     77.5 ml LV SV MOD BP:      39.2 ml RIGHT VENTRICLE             IVC RV S prime:     13.90 cm/s  IVC diam: 1.50 cm TAPSE (M-mode): 1.6 cm LEFT ATRIUM             Index        RIGHT ATRIUM           Index LA diam:        3.00 cm 1.76 cm/m   RA Area:     10.40 cm LA Vol (A2C):   23.2 ml 13.65 ml/m  RA Volume:   20.40 ml  12.00 ml/m LA Vol (A4C):   19.2 ml 11.29 ml/m LA Biplane Vol: 21.2 ml 12.47 ml/m  AORTIC VALVE AV Area (Vmax):    2.85 cm AV Area (Vmean):   2.41 cm AV Area (VTI):     2.39 cm AV Vmax:           124.00 cm/s AV Vmean:          93.450 cm/s AV VTI:            0.166 m AV Peak Grad:      6.2 mmHg AV Mean Grad:      4.0 mmHg LVOT Vmax:         124.50 cm/s LVOT Vmean:        79.500 cm/s LVOT VTI:          0.140 m LVOT/AV VTI ratio: 0.84  AORTA Ao Root diam: 3.30 cm Ao Asc diam:  2.80 cm MITRAL VALVE               TRICUSPID VALVE MV Area (PHT): 4.06 cm    TR Peak grad:   30.0 mmHg MV Area VTI:   1.72 cm    TR Vmax:        274.00 cm/s MV Peak grad:  5.3 mmHg MV Mean grad:  2.0 mmHg    SHUNTS MV Vmax:       1.15 m/s    Systemic VTI:  0.14 m MV Vmean:      68.2 cm/s   Systemic Diam: 1.90 cm MV Decel Time: 187 msec MV E velocity: 72.90 cm/s  MV A velocity: 89.60 cm/s MV E/A ratio:  0.81 Epifanio Lesches MD Electronically signed by Epifanio Lesches MD Signature Date/Time: 06/17/2022/10:47:05 AM    Final    CT VENOGRAM HEAD  Result Date: 06/17/2022 CLINICAL DATA:  Intracranial hemorrhage. EXAM: CT VENOGRAM HEAD TECHNIQUE: Venographic phase images of the brain were obtained following the administration of intravenous contrast. Multiplanar reformats and maximum intensity projections were generated. RADIATION DOSE REDUCTION: This exam was performed according to the departmental dose-optimization program which includes automated exposure control, adjustment of the mA  and/or kV according to patient size and/or use of iterative reconstruction technique. CONTRAST:  75mL OMNIPAQUE IOHEXOL 350 MG/ML SOLN COMPARISON:  None Available. FINDINGS: Filling defect of the right transverse sinus and right transverse sigmoid junction which are somewhat large and tubular for arachnoid granulations, although an arachnoid granulation at the left transverse sigmoid junction appears nearly similar. The location would also not explain the acute hemorrhage. No visible cortical or central venous thrombosis. IMPRESSION: No cortical or central venous thrombosis to explain the ICH. There are filling defects at the right transverse and transverse sigmoid dural sinuses which are prominent for arachnoid granulations but not definitive for thrombus. Attention at follow-up imaging. Electronically Signed   By: Tiburcio Pea M.D.   On: 06/17/2022 05:30   CT ANGIO HEAD NECK W WO CM  Result Date: 06/17/2022 CLINICAL DATA:  Found unresponsive. Intracranial hemorrhage. History of cocaine use EXAM: CT ANGIOGRAPHY HEAD AND NECK WITH AND WITHOUT CONTRAST TECHNIQUE: Multidetector CT imaging of the head and neck was performed using the standard protocol during bolus administration of intravenous contrast. Multiplanar CT image reconstructions and MIPs were obtained to evaluate the vascular anatomy. Carotid stenosis measurements (when applicable) are obtained utilizing NASCET criteria, using the distal internal carotid diameter as the denominator. RADIATION DOSE REDUCTION: This exam was performed according to the departmental dose-optimization program which includes automated exposure control, adjustment of the mA and/or kV according to patient size and/or use of iterative reconstruction technique. CONTRAST:  75mL OMNIPAQUE IOHEXOL 350 MG/ML SOLN COMPARISON:  Head CT from earlier today FINDINGS: CTA NECK FINDINGS Aortic arch: Unremarkable Right carotid system: Mixed density plaque at the bifurcation with some  low-density plaque bulging into the ICA bulb. No ulceration or flow limiting stenosis Left carotid system: Mainly low-density plaque at the bifurcation without stenosis or ulceration. Vertebral arteries: No proximal subclavian stenosis. The vertebral arteries are somewhat tortuous but smoothly contoured and diffusely patent Skeleton: No acute or aggressive finding. Other neck: No acute finding Upper chest: Mild opacification of dependent lungs which could be atelectasis. Centrilobular emphysema. Review of the MIP images confirms the above findings CTA HEAD FINDINGS Anterior circulation: No aneurysm or spot sign seen underlying the ICH. There is a aneurysm projecting leftward from the left carotid terminus which measures 4 mm. No adjacent hemorrhage. No major branch occlusion. There is undulation of the bilateral intracranial branches which is likely atheromatous given findings in the neck. No segmental beading. Some less intense flow and right MCA branches is likely related to mass effect by the ICH. Posterior circulation: The vertebral and basilar arteries arediffusely patent. Undulation of the left more than right PCA with up to moderate narrowing on the left, likely atheromatous given the findings in the neck. Venous sinuses: Reference dedicated CT venogram Anatomic variants: No acute finding Review of the MIP images confirms the above findings IMPRESSION: 1. No vascular lesion or spot sign seen at the acute ICH. 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 3. 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 Electronically Signed   By: Tiburcio Pea M.D.   On: 06/17/2022 05:18   CT Head Wo Contrast  Result Date: 06/17/2022 CLINICAL DATA:  Altered mental status EXAM: CT HEAD WITHOUT CONTRAST TECHNIQUE: Contiguous axial images were obtained from the base of the skull through the vertex without intravenous contrast. RADIATION DOSE REDUCTION: This exam was performed according to the  departmental dose-optimization program which includes automated exposure control, adjustment of the mA and/or kV according to patient size and/or use of iterative reconstruction technique. COMPARISON:  None Available. FINDINGS: Brain: There is a large area of parenchymal hemorrhage identified in the right cerebral hemisphere centered primarily within the basal ganglia and extending superiorly into the centrum semi ovale. This measures approximately 4.7 x 4.0 cm in greatest AP and transverse dimensions respectively. It extends for approximately 4.7 cm in craniocaudad dimension. Significant mass-effect upon the right lateral ventricle is noted. Mild midline shift of approximately 5 mm is noted. Some surrounding edema is seen as well. No other hemorrhage is noted. Relative loss of the sulcal markings is noted likely related to some increased intracranial pressure. Vascular: No hyperdense vessel or unexpected calcification. Skull: Normal. Negative for fracture or focal lesion. Sinuses/Orbits: No acute finding. Other: None. IMPRESSION: Right-sided intraparenchymal hemorrhage as described with associated edema and mild midline shift of 5 mm from right to left. Critical Value/emergent results were called by telephone at the time of interpretation on 06/17/2022 at 2:27 am to Dr. Donna Bernard , who verbally acknowledged these results. Electronically Signed   By: Alcide Clever M.D.   On: 06/17/2022 02:29   DG Abdomen 1 View  Result Date: 06/17/2022 CLINICAL DATA:  Check gastric catheter placement EXAM: ABDOMEN - 1 VIEW COMPARISON:  None Available. FINDINGS: Gastric catheter is noted extending into the stomach. Proximal side port lies at the gastroesophageal junction. This should be advanced further into the stomach. No free air is seen. No bony abnormality is noted. IMPRESSION: Gastric catheter as described. Electronically Signed   By: Alcide Clever M.D.   On: 06/17/2022 02:16   DG Chest Port 1 View  Result Date:  06/17/2022 CLINICAL DATA:  Check endotracheal tube placement EXAM: PORTABLE CHEST 1 VIEW COMPARISON:  10/21/2020 FINDINGS: Cardiac shadow is within normal limits. Endotracheal tube is noted 1.5 cm above the carina. Gastric catheter is noted extending into the stomach. Patchy increased density is noted in the right middle lobe beneath the minor fissure consistent with acute infiltrate. No sizable effusion is noted. No bony abnormality is seen. IMPRESSION: Right middle lobe infiltrate. Tubes and lines as described. Electronically Signed   By: Alcide Clever M.D.   On: 06/17/2022 02:16     PHYSICAL EXAM  Temp:  [95.5 F (35.3 C)-100.4 F (38 C)] 99 F (37.2 C) (05/13 1300) Pulse Rate:  [46-125] 120 (05/13 1300) Resp:  [18-33] 33 (05/13 1300) BP: (119-215)/(63-113) 139/90 (05/13 1300) SpO2:  [84 %-100 %] 94 % (05/13 1300) FiO2 (%):  [40 %-100 %] 70 % (05/13 1210) Weight:  [60 kg] 60 kg (05/13 0700)  General - Well nourished, well developed, intubated on sedation.  Ophthalmologic - fundi not visualized due to noncooperation.  Cardiovascular - Regular rhythm but tachycardia.  Neuro - intubated still on fentanyl and just off versed, not open eyes on voice, not following commands. With forced eye opening, right pupil 1.5 mm, left pupil 4 mm, both not reactive to light.  Corneal reflex absent, cough absent. Breathing over the vent.  Facial symmetry not able to test due to ET tube.  Tongue protrusion not cooperative. On pain stimulation, no movement in all extremities. Sensation, coordination and gait not tested.   ASSESSMENT/PLAN Ms. Rebecca [name] is a 47 y.o. female with history of cocaine abuse admitted for unresponsiveness.  Intubated in ER for airway protection.  No tPA given due to ICH.    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 Vasospasm from ICH vs.  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  2D Echo EF 50 to 55% LDL 65 HgbA1c 5.9 UDS positive for cocaine Heparin subcu for VTE prophylaxis No antithrombotic prior to admission, now on No antithrombotic due to ICH. Therapy recommendations: Pending Disposition: Pending, prognosis likely to be poor, palliative care consulted.   Cerebral edema CT right BG and CR large ICH, 5 mm midline shift CT repeat 5/12 stable ICH with leftward midline shift of 5 mm CT repeat 5/13 unchanged appearance of right ICH with adjacent edema. Midline shift is 5 mm. Na 151->155->157->159-> 155->158->150 Resumed 3% saline Off free water  Na monitoring LTM EEG cortical dysfunction arising from right hemisphere with diffuse encephalopathy, no seizures  Respiratory failure Aspiration pneumonia Leukocytosis Intubated on vent Still on fentanyl CCM on board CXR right middle lobe infiltrate On azithromycin and Rocephin Tmax 100.6->100.3 WBC 22.2->20.1->15.8  Hypertension BP unstable Off cleviprex -> resumed cleviprex Labetalol and hydralazine IV PRN BP goal < 160 Long term BP goal normotensive  AKI Creatinine 2.15-1.32-1.03-0.73 On tube feeding and IVF  UTI UA WBC 21-50 On Rocephin Urine culture + STREPTOCOCCUS AGALACTIAE   Cocaine abuse History of  cocaine abuse UDS positive for cocaine Cessation education will be provided  Dysphagia On tube feeding  Other Stroke Risk Factors   Other Active Problems Cerebral aneurysm, CTA head and neck showed 4 mm left terminal ICA aneurysm, asymptomatic, outpatient follow-up  Hospital day # 3  Marvel Plan, MD PhD Stroke Neurology 06/20/2022 1:31 PM  This patient is critically ill due to large ICH, stroke, dural venous sinus thrombosis, cerebral edema, cocaine abuse, AKI, pneumonia, UTI and at significant risk of neurological worsening, death form reherniation, brain death, seizure, sepsis. This patient's care requires constant monitoring of vital signs, hemodynamics, respiratory and cardiac monitoring, review of multiple databases, neurological assessment, discussion with family, other specialists and medical decision making of high complexity. I spent 35 minutes of neurocritical care time in the care of this patient.  To contact Stroke Continuity provider, please refer to [email or web address]. After hours, contact General Neurology

## 2022-06-20 NOTE — Procedures (Signed)
Insertion of Chest Tube Procedure Note  Rebecca [name]  [id number]  [birth year]  Date:06/20/22  Time:4:48 PM    Provider Performing: Lorin Glass   Procedure: Pleural Catheter Insertion w/ Imaging Guidance ([id number])  Indication(s) Pneumothorax  Consent Risks of the procedure as well as the alternatives and risks of each were explained to the patient and/or caregiver.  Consent for the procedure was obtained and is signed in the bedside chart  Anesthesia Topical only with 1% lidocaine    Time Out Verified patient identification, verified procedure, site/side was marked, verified correct patient position, special equipment/implants available, medications/allergies/relevant history reviewed, required imaging and test results available.   Sterile Technique Maximal sterile technique including full sterile barrier drape, hand hygiene, sterile gown, sterile gloves, mask, hair covering, sterile ultrasound probe cover (if used).   Procedure Description Ultrasound used to identify appropriate pleural anatomy for placement and overlying skin marked. Area of placement cleaned and draped in sterile fashion.  A 14 French pigtail pleural catheter was placed into the left pleural space using Seldinger technique. Appropriate return of fluid was obtained.  The tube was connected to atrium and placed on -20 cm H2O wall suction.   Complications/Tolerance None; patient tolerated the procedure well. Chest X-ray is ordered to verify placement.   EBL Minimal  Specimen(s) none

## 2022-06-20 NOTE — Progress Notes (Signed)
PT Cancellation Note  Patient Details Name: Rebecca [name] MRN: [id number] DOB: 24-May-1975   Cancelled Treatment:    Reason Eval/Treat Not Completed: Medical issues which prohibited therapy; noted pt still intubated and sedated.  Per RN not ready for PT today.  Will follow up.   Rebecca [name] 06/20/2022, 11:29 AM Rebecca [name], PT Acute Rehabilitation Services Office:[phone] 06/20/2022

## 2022-06-20 NOTE — Progress Notes (Signed)
LTM EEG disconnected - no skin breakdown at unhook.  

## 2022-06-20 NOTE — Progress Notes (Addendum)
NAME:  Rebecca [name], MRN:  [id number], DOB:  October 04, 1975, LOS: 3 ADMISSION DATE:  06/17/2022, CONSULTATION DATE:  06/20/22 REFERRING MD:  EDP, CHIEF COMPLAINT:  unresponsive   History of Present Illness:  47 yo female with hx of polysubstance abuse presented brought to [facility] ED with AMS.  Intubated for airway protection.  CT head showed large Rt ICH with 5 mm midline shift.  UDS positive for cocaine.  Transferred to [facility] for further management.  PCCM consulted to assist with management in ICU.   Hx from chart and medical team.  Pertinent  Medical History  Substance abuse  Significant Hospital Events: Including procedures, antibiotic start and stop dates in addition to other pertinent events   5/10 Admit, neurosurgery consulted 5/11 remains on ventilator with hypertonic saline and EEG in place. CVC placed.  5/12 no acute events overnight, MRI obtained results pending  Interim History / Subjective:  -Overnight Stat CT for new left blown pupil -Remains minimally responsive on ventilator with fentanyl drip.  -Possible aspiration event yesterday -hypertensive overnight/this morning to 200s systolic.   Objective   Blood pressure (!) 196/85, pulse (!) 57, temperature 100 F (37.8 C), resp. rate (!) 23, height 5\' 5"  (1.651 m), weight 60 kg, SpO2 100 %.    Vent Mode: PRVC FiO2 (%):  [40 %] 40 % Set Rate:  [18 bmp] 18 bmp Vt Set:  [480 mL] 480 mL PEEP:  [5 cmH20-8 cmH20] 5 cmH20 Plateau Pressure:  [17 cmH20-19 cmH20] 19 cmH20   Intake/Output Summary (Last 24 hours) at 06/20/2022 0908 Last data filed at 06/20/2022 0800 Gross per 24 hour  Intake 1525.73 ml  Output 500 ml  Net 1025.73 ml   Filed Weights   06/18/22 0305 06/19/22 0500 06/20/22 0700  Weight: 60.5 kg 60.8 kg 60 kg   Physical exam General: Acute ill-appearing adult female lying in bed on mechanical ventilation in no acute distress HEENT: ETT, MM pink/moist,  Neuro: Pupils non-reactive, unequal L>R.  CV: s1s2 regular  rate and rhythm, no murmur, rubs, or gallops. PULM: Rhonchi bilaterally. No increased work of breathing, tolerating ventilator. GI: bowel sounds active in all 4 quadrants, non-tender. Moderate distension, but soft. Tolerating TF Extremities: warm/dry, no edema  Skin: no rashes or lesions  Resolved Hospital Problem list     Assessment & Plan:   Right basal ganglia hemorrhage with mass effect and brain compression with mid-line shift R MCA Stroke and B ACA punctate infarcts R Sigmoid sinus thrombosis and IJ DVST Patient initially admitted with IPH with mass effect, now found to have further sequelae of stroke, new infarcts and sinus thrombosis on MRI. She remains comatose with new neuro deficits unchanged from yesterday, overall concerning picture for prognosis.  -Primary management per neurology, appreciate assistance -Hypertonic saline per neuro -Trend sodium -AEDs per neuro -Maintain neuroprotective measures  Acute Hypoxemic Respiratory Failure CAP vs aspiration PNA Vent settings unchanged overnight, on empiric treatment for CAP vs. Aspiration pneumonia. Her leukocytosis is improving and no signs of sepsis, elevated temps likely central fever as she has no other signs of ongoing infection. However she will need to remain intubated for airway protection secondary to mental status that has remained unchanged since admission.  P: -Switch to Unasyn for aspiration PNA coverage -Continue ventilator support with lung protective strategies  -Wean PEEP and FiO2 for sats greater than 90%. -Head of bed elevated 30 degrees. -Plateau pressures less than 30 cm H20.  -Follow intermittent chest x-ray and ABG.   -Ensure  adequate pulmonary hygiene  -Follow cultures  -VAP bundle in place  -PAD protocol  Hypertension  Patient persistently hypertensive to 200s overnight,  -Start cleviprex for BP goal<160 -Amlodipine 10mg  daily  Hypokalemia K 3.3 today, replete today -Continue to  monitor  Anemia P: -Transfuse per protocol -Hemoglobin goal 7 -Monitor for signs of bleeding  Urinary tract infection present on admission Urinalysis with many bacteria, moderate leukocytes, but negative nitrates.  Due to poor mental status and current intubation we will to determine if patient is symptomatic. Treated with CTX x 4 days with empiric PNA coverage.   New HFrEF Euvolemic on exam, continue to assess if diuresis needed.  -Echocardiogram completed 06/16/2022 with EF 50 to 55%, moderate left ventricular hypertrophy and grade 1 diastolic dysfunction  Elevated troponin in setting of cocaine abuse -High-sensitivity troponin peaked at 708 P: Continuous telemetry Optimize electrolytes  AKI from hypovolemia - resolved Baseline creatinine 0.87 from 10/21/20, creatinine on admission 2.03 GFR 30. Now back to baseline, continue to monitor.   At risk malnutrition P: Continue tube feeds  Best Practice:   Diet/type: tubefeeds DVT prophylaxis: SCD GI prophylaxis: PPI Lines: N/A Foley:  Yes, and it is still needed Code Status:  full code Last date of multidisciplinary goals of care discussion: Per primary  Critical care time:

## 2022-06-20 NOTE — Progress Notes (Signed)
Pt transported to CT and back to 8M, no incidents occurred.

## 2022-06-20 NOTE — Progress Notes (Signed)
Pharmacy Antibiotic Note  Rebecca [name] is a 47 y.o. female admitted on 06/17/2022 with aspiration pneumonia. Patient was originally started on ceftriaxone and azithromycin therapy for CAP, but has continued to have fevers. Pharmacy has been consulted for Unasyn (ampicillin-sulbactam) dosing.  WBC trending down to 15.8, Tmax 100.4 SCr trending down to 0.73  Plan: Start Unasyn 3g IV q6h  Discontinue Azithromycin and Ceftriaxone Monitor daily CBC, temp, SCr, and for clinical signs of improvement  F/u cultures and de-escalate antibiotics as able   Height: 5\' 5"  (165.1 cm) Weight: 60 kg (132 lb 4.4 oz) IBW/kg (Calculated) : 57  Temp (24hrs), Avg:99.1 F (37.3 C), Min:95.5 F (35.3 C), Max:100.4 F (38 C)  Recent Labs  Lab 06/17/22 0145 06/17/22 0256 06/17/22 0759 06/17/22 1630 06/17/22 1802 06/17/22 2002 06/17/22 2310 06/18/22 0242 06/18/22 0616 06/18/22 0915 06/20/22 0403  WBC 22.2*  --   --   --   --   --   --  20.1*  --   --  15.8*  CREATININE 2.03*  --    < > 1.30*  --    < > 1.26* 1.13* 1.16* 1.03* 0.73  LATICACIDVEN  --  4.2*  --  1.2 2.0*  --   --   --   --   --   --    < > = values in this interval not displayed.    Estimated Creatinine Clearance: 78.2 mL/min (by C-G formula based on SCr of 0.73 mg/dL).    No Known Allergies  Antimicrobials this admission: Metronidazole 5/10 x1 Azithromycin 5/10 >> 5/12 Ceftriaxone 5/10 >> 5/13 Unasyn 5/13 >>   Dose adjustments this admission: N/A  Microbiology results: 5/10 MRSA PCR: detected 5/11 trach aspirate: reincubated for better growth 5/11 Bld cx: NGTD x2 days  Thank you for allowing pharmacy to be a part of this patient's care.  Wilburn Cornelia, PharmD, BCPS Clinical Pharmacist 06/20/2022 8:02 AM   Please refer to AMION for pharmacy phone number

## 2022-06-20 NOTE — Progress Notes (Addendum)
47 yo F admitted 5/10 w R ICH, associated midline shift. Intubated.   To bedside for worse hypoxia Reported guppy breathing preceding, maybe some vent dyssynchrony. Bolus of fent and versed had been given, RT had incr FiO2 to 100 % and SpO2 are slowly recovering at time of my arrival Sounds like earlier today abx changed to unasyn in light of possible aspiration event yesterday.   On exam  Unequal pupils, sedated ETT  Bite block in place. Incredibly difficult open mouth to pass oral suction, and to pass suction through ETT   Symmetrical chest expansion bilat rhonchi and a deep wheeze. Tachycardic    Plan -ordering CXR -wean FiO2 incr PEEP (currently at 70% and 8 PEEP, peaks plats driving pressures acceptable) -will order for a 1x 4mg  versed -- it feels like she is biting down  -will order for 1x lasix (is net +)  -ordering SCH duonebs    CCT 28 min   Tessie Fass MSN, AGACNP-BC [facility] Pulmonary/Critical Care Medicine Amion for pager  06/20/2022, 12:02 PM

## 2022-06-20 NOTE — Procedures (Addendum)
Patient Name: ALYSHA DRAEGER  MRN: [id number]  Epilepsy Attending: Charlsie Quest  Referring Physician/Provider: Erick Blinks, MD  Duration: 06/19/2022 1430 to 06/20/2022 1005   Patient history: 47 y.o. female with hx of cocaine use p/w unresponsive and large R IPH with brain compression and 5mm midline shift. EEG to evaluate for seizure   Level of alertness: Awake, asleep   AEDs during EEG study: None   Technical aspects: This EEG study was done with scalp electrodes positioned according to the 10-20 International system of electrode placement. Electrical activity was reviewed with band pass filter of 1-70Hz , sensitivity of 7 uV/mm, display speed of 18mm/sec with a 60Hz  notched filter applied as appropriate. EEG data were recorded continuously and digitally stored.  Video monitoring was available and reviewed as appropriate.   Description: The posterior dominant rhythm consists of 7.5 Hz activity of moderate voltage (25-35 uV) seen predominantly in posterior head regions, symmetric and reactive to eye opening and eye closing. Sleep was characterized by vertex waves, sleep spindles (12 to 14 Hz), maximal frontocentral region. EEG showed continuous 3 to 6 Hz theta-delta slowing in right hemisphere, maximal right temporal region. Intermittent generalized 3-6hz  theta-delta slowing was also noted. Hyperventilation and photic stimulation were not performed.    ABNORMALITY - Continuous slow, right hemisphere, maximal right temporal region - Intermittent slow, generalized   IMPRESSION: This study is suggestive of cortical dysfunction arising from right hemisphere, maximal right temporal region likely secondary to underlying structural abnormality. Additionally there is mild to moderate diffuse encephalopathy. No seizures or epileptiform discharges were seen throughout the recording.   Ascencion Stegner Annabelle Harman

## 2022-06-20 NOTE — Progress Notes (Signed)
OT Cancellation Note  Patient Details Name: RINKA JERSEY MRN: [id number] DOB: [birth year]   Cancelled Treatment:    Reason Eval/Treat Not Completed: Patient not medically ready (RN asking for therapies to hold today. OT evaluation to f/u when appropriate.)  Donia Pounds 06/20/2022, 3:09 PM

## 2022-06-21 ENCOUNTER — Inpatient Hospital Stay (HOSPITAL_COMMUNITY): Payer: Medicaid Other

## 2022-06-21 DIAGNOSIS — I614 Nontraumatic intracerebral hemorrhage in cerebellum: Secondary | ICD-10-CM

## 2022-06-21 DIAGNOSIS — J9601 Acute respiratory failure with hypoxia: Secondary | ICD-10-CM

## 2022-06-21 DIAGNOSIS — Z515 Encounter for palliative care: Secondary | ICD-10-CM | POA: Diagnosis not present

## 2022-06-21 DIAGNOSIS — Z7189 Other specified counseling: Secondary | ICD-10-CM

## 2022-06-21 DIAGNOSIS — I610 Nontraumatic intracerebral hemorrhage in hemisphere, subcortical: Secondary | ICD-10-CM | POA: Diagnosis not present

## 2022-06-21 LAB — GLUCOSE, CAPILLARY
Glucose-Capillary: 117 mg/dL — ABNORMAL HIGH (ref 70–99)
Glucose-Capillary: 107 mg/dL — ABNORMAL HIGH (ref 70–99)
Glucose-Capillary: 104 mg/dL — ABNORMAL HIGH (ref 70–99)
Glucose-Capillary: 112 mg/dL — ABNORMAL HIGH (ref 70–99)
Glucose-Capillary: 121 mg/dL — ABNORMAL HIGH (ref 70–99)
Glucose-Capillary: 99 mg/dL (ref 70–99)

## 2022-06-21 LAB — CBC
HCT: 30.9 % — ABNORMAL LOW (ref 36.0–46.0)
Hemoglobin: 9.2 g/dL — ABNORMAL LOW (ref 12.0–15.0)
MCH: 21.8 pg — ABNORMAL LOW (ref 26.0–34.0)
MCHC: 29.8 g/dL — ABNORMAL LOW (ref 30.0–36.0)
MCV: 73.2 fL — ABNORMAL LOW (ref 80.0–100.0)
Platelets: 298 10*3/uL (ref 150–400)
RBC: 4.22 MIL/uL (ref 3.87–5.11)
RDW: 20 % — ABNORMAL HIGH (ref 11.5–15.5)
WBC: 14 10*3/uL — ABNORMAL HIGH (ref 4.0–10.5)
nRBC: 0 % (ref 0.0–0.2)

## 2022-06-21 LAB — POCT I-STAT 7, (LYTES, BLD GAS, ICA,H+H)
Acid-Base Excess: 2 mmol/L (ref 0.0–2.0)
Bicarbonate: 25.3 mmol/L (ref 20.0–28.0)
Calcium, Ion: 1.28 mmol/L (ref 1.15–1.40)
HCT: 29 % — ABNORMAL LOW (ref 36.0–46.0)
Hemoglobin: 9.9 g/dL — ABNORMAL LOW (ref 12.0–15.0)
O2 Saturation: 95 %
Patient temperature: 36.3
Potassium: 3.5 mmol/L (ref 3.5–5.1)
Sodium: 162 mmol/L (ref 135–145)
TCO2: 26 mmol/L (ref 22–32)
pCO2 arterial: 32.5 mmHg (ref 32–48)
pH, Arterial: 7.497 — ABNORMAL HIGH (ref 7.35–7.45)
pO2, Arterial: 68 mmHg — ABNORMAL LOW (ref 83–108)

## 2022-06-21 LAB — CULTURE, RESPIRATORY W GRAM STAIN

## 2022-06-21 LAB — BASIC METABOLIC PANEL
Anion gap: 10 (ref 5–15)
BUN: 21 mg/dL — ABNORMAL HIGH (ref 6–20)
CO2: 25 mmol/L (ref 22–32)
Calcium: 8.7 mg/dL — ABNORMAL LOW (ref 8.9–10.3)
Chloride: 124 mmol/L — ABNORMAL HIGH (ref 98–111)
Creatinine, Ser: 0.87 mg/dL (ref 0.44–1.00)
GFR, Estimated: >60 mL/min (ref >60)
Glucose, Bld: 149 mg/dL — ABNORMAL HIGH (ref 70–99)
Potassium: 3.4 mmol/L — ABNORMAL LOW (ref 3.5–5.1)
Sodium: 159 mmol/L — ABNORMAL HIGH (ref 135–145)

## 2022-06-21 LAB — SODIUM: Sodium: 161 mmol/L (ref 135–145)

## 2022-06-21 LAB — TRIGLYCERIDES: Triglycerides: 123 mg/dL (ref <150)

## 2022-06-21 MED ORDER — FENTANYL CITRATE PF 50 MCG/ML IJ SOSY
50.0000 ug | PREFILLED_SYRINGE | Freq: Once | INTRAMUSCULAR | Status: AC
  Administered 2022-06-21: 50 ug via INTRAVENOUS

## 2022-06-21 MED ORDER — LISINOPRIL 20 MG PO TABS
40.0000 mg | ORAL_TABLET | Freq: Every day | ORAL | Status: DC
  Administered 2022-06-21 – 2022-06-24 (×4): 40 mg
  Filled 2022-06-21 (×4): qty 2

## 2022-06-21 MED ORDER — FOLIC ACID 5 MG/ML IJ SOLN
1.0000 mg | Freq: Every day | INTRAMUSCULAR | Status: DC
  Administered 2022-06-21 – 2022-06-22 (×2): 1 mg via INTRAVENOUS
  Filled 2022-06-21 (×3): qty 0.2

## 2022-06-21 MED ORDER — DEXMEDETOMIDINE HCL IN NACL 400 MCG/100ML IV SOLN
INTRAVENOUS | Status: AC
  Filled 2022-06-21: qty 100

## 2022-06-21 MED ORDER — ADULT MULTIVITAMIN W/MINERALS CH
1.0000 | ORAL_TABLET | Freq: Every day | ORAL | Status: DC
  Administered 2022-06-21 – 2022-07-01 (×11): 1
  Filled 2022-06-21 (×11): qty 1

## 2022-06-21 MED ORDER — DEXMEDETOMIDINE HCL IN NACL 400 MCG/100ML IV SOLN
0.0000 ug/kg/h | INTRAVENOUS | Status: DC
  Administered 2022-06-21: 0.4 ug/kg/h via INTRAVENOUS

## 2022-06-21 MED ORDER — CLONIDINE HCL 0.1 MG PO TABS: 0.1000 mg | ORAL_TABLET | Freq: Three times a day (TID) | ORAL | Status: DC

## 2022-06-21 MED ORDER — POTASSIUM CHLORIDE 20 MEQ PO PACK
80.0000 meq | PACK | Freq: Once | ORAL | Status: AC
  Administered 2022-06-21: 80 meq
  Filled 2022-06-21: qty 4

## 2022-06-21 MED ORDER — THIAMINE HCL 100 MG/ML IJ SOLN
100.0000 mg | Freq: Every day | INTRAMUSCULAR | Status: DC
  Administered 2022-06-21 – 2022-06-23 (×3): 100 mg via INTRAVENOUS
  Filled 2022-06-21 (×3): qty 2

## 2022-06-21 MED ORDER — FENTANYL CITRATE PF 50 MCG/ML IJ SOSY
PREFILLED_SYRINGE | INTRAMUSCULAR | Status: AC
  Filled 2022-06-21: qty 1

## 2022-06-21 MED ORDER — BISACODYL 10 MG RE SUPP
10.0000 mg | Freq: Once | RECTAL | Status: AC
  Administered 2022-06-21: 10 mg via RECTAL
  Filled 2022-06-21: qty 1

## 2022-06-21 NOTE — Consult Note (Addendum)
Palliative Medicine Inpatient Consult Note  Consulting Provider: Dr. Roda Shutters  Reason for consult:   Palliative Care Consult Services Palliative Medicine Consult  Reason for Consult? pt intubated, large ICH, now with new stroke, and aspiration, left pupil ballooning, need GOC discussion. Thanks a lot.   06/21/2022  HPI:  Per intake H&P --> 47 yo female with hx of polysubstance abuse presented brought to [facility] ED with AMS. Identified to have significant R sided ICH. Is receiving care in the ICU as is intubation. Neurologically has a poor prognosis therefore the Palliative care team has been asked to get involved for further conversations.    Clinical Assessment/Goals of Care:  *Please note that this is a verbal dictation therefore any spelling or grammatical errors are due to the "[employer] One" system interpretation.  I have reviewed medical records including EPIC notes, labs and imaging, received report from bedside RN, assessed the patient who is ventilated and sedated.   I called patient's daughter, [name] Gant to further discuss diagnosis prognosis, GOC, EOL wishes, disposition and options.   I introduced Palliative Medicine as specialized medical care for people living with serious illness. It focuses on providing relief from the symptoms and stress of a serious illness. The goal is to improve quality of life for both the patient and the family.  Medical History Review and Understanding:  A review of Janeth's past medical history inclusive of her cocaine abuse, multiple strokes, and newly identified intracranial hemorrhage was held.  Social History:  Statia is identified as a woman who has always lived on the "edge".  She has a long-term boyfriend.  She has 3 daughters the youngest of which is 58. She has not been working.  She is someone who has been able to do everything on her own.  She is a woman of faith and practices within Christianity.  Functional and Nutritional  State:  The time he was able to perform all B ADLs and IADLs preceding admission.  Advance Directives:  A detailed discussion was had today regarding advanced directives.  Patient does not have advanced directives therefore her 2 adult daughters are her surrogate decision makers.  Code Status:  Concepts specific to code status, artifical feeding and hydration, continued IV antibiotics and rehospitalization was had.  The difference between a aggressive medical intervention path  and a palliative comfort care path for this patient at this time was had.   Discussions were held as they related to resuscitation status.  We reviewed the possible trauma that can occur as a result of a true cardiopulmonary resuscitation effort and the possible damage that may occur long-term as a result of this.  I shared worry in the setting of [place] is already deteriorated health state.  Patient's oldest daughter, [name] is very much leaning towards a DO NOT RESUSCITATE though she would like to speak to both her sisters and her aunt prior to proceeding with this decision.  Discussion:  We reviewed the unfortunate reality of Hampton's clinical condition and what the long-term may look like from the perspective of cognitive and functional status.  We reviewed that at this point in time it is very much looking like Alonnah will proceed to live a life dependent upon artificial support such as a breathing machine, gastrostomy tube, and will require total care likely for the rest of her life.  We discussed the differences between living a life and existing through life.  We discussed the important things to [facility] her being able to be  present and active in her family's life which sadly at this time she is likely to not be able to do ever again.  We did share the idea of comfort focused care as opposed to aggressive modalities of care.  We reviewed that that would entail stopping the aggressive measures such as  mechanical ventilatory support and the various IV drip medications to maintain patient's present clinical state.  We talked about only giving medication that would alleviate suffering.  [name] shares with me that she would like to speak more to her family about these decisions.  We did review that if desired I could coordinate a meeting with the healthcare staff to aid in further conversations.  Discussed the importance of continued conversation with family and their  medical providers regarding overall plan of care and treatment options, ensuring decisions are within the context of the patients values and GOCs.  Decision Maker: Gant,[name] (Daughter): [phone] (Mobile)   SUMMARY OF RECOMMENDATIONS   Full code--> discussed with patient's daughter, [name] the potential outcomes of resuscitative efforts which she would like to discuss with her family  Open and honest conversations held in the setting of patient's acute illness gently broached the topics of comfort oriented care  I have offered to set up a family meeting with the critical care team as well as neurology --> patient's daughter plans to get back to me regarding if she would like this to take place or satisfied with the conversation that was held between just she and I. I will request Dr. Roda Shutters reach out to provide Neurological insights.   Palliative care will remain involved and continue conversations in the oncoming days  Code Status/Advance Care Planning: FULL CODE  Palliative Prophylaxis:  Aspiration, Bowel Regimen, Delirium Protocol, Frequent Pain Assessment, Oral Care, Palliative Wound Care, and Turn Reposition  Additional Recommendations (Limitations, Scope, Preferences): Continue current scope of care  Psycho-social/Spiritual:  Desire for further Chaplaincy support: Patient is [place] Additional Recommendations: Education on hemorrhagic strokes   Prognosis: Very poor overall.  Discharge Planning: Discharge  plan is uncertain at this time.  Vitals:   06/21/22 0545 06/21/22 0600  BP: (!) 166/101 (!) 140/85  Pulse: 100 (!) 112  Resp: (!) 25 (!) 30  Temp: (!) 97.5 F (36.4 C) (!) 96.4 F (35.8 C)  SpO2: 98% 96%    Intake/Output Summary (Last 24 hours) at 06/21/2022 0706 Last data filed at 06/21/2022 0600 Gross per 24 hour  Intake 2699.48 ml  Output 2325 ml  Net 374.48 ml   Last Weight  Most recent update: 06/21/2022  4:12 AM    Weight  59.5 kg (131 lb 2.8 oz)            Gen: Middle-aged African-American female chronically ill in appearance HEENT: OGT, ETT, dry mucous membranes CV: Irregular rate and rhythm PULM: On mechanical ventilatory support ABD: Slight distention soft hypoactive bowel sounds EXT: Peripheral edema edema  Neuro: On sedatives, withdraws to pain  PPS: 10%   This conversation/these recommendations were discussed with patient primary care team, Dr. Merrily Pew  Billing based on MDM: High  Problems Addressed: One acute or chronic illness or injury that poses a threat to life or bodily function  Amount and/or Complexity of Data: Category 3:Discussion of management or test interpretation with external physician/other qualified health care professional/appropriate source (not separately reported)  Risks: Decision not to resuscitate or to de-escalate care because of poor prognosis ______________________________________________________ Lamarr Lulas [facility] Palliative Medicine Team Team Cell Phone: [phone] Please  utilize secure chat with additional questions, if there is no response within 30 minutes please call the above phone number  Palliative Medicine Team providers are available by phone from 7am to 7pm daily and can be reached through the team cell phone.  Should this patient require assistance outside of these hours, please call the patient's attending physician.

## 2022-06-21 NOTE — Progress Notes (Signed)
STROKE TEAM PROGRESS NOTE   SUBJECTIVE (INTERVAL HISTORY) No family at the bedside. Pt on precedex, still has tachycardia, on cleviprex, unresponsive. Left pupil still larger than right but seems smaller than yesterday. Had chest tube yesterday due to pneumothorax. Palliative care on board for poor prognosis. I talked with daughter Rebecca [name] over the phone, she is coming to see pt in a couple of days and to make decision then.    OBJECTIVE Temp:  [96.3 F (35.7 C)-100 F (37.8 C)] 99.3 F (37.4 C) (05/14 1543) Pulse Rate:  [76-142] 114 (05/14 1430) Cardiac Rhythm: Sinus tachycardia (05/14 0800) Resp:  [19-43] 23 (05/14 1430) BP: (108-190)/(74-118) 117/78 (05/14 1430) SpO2:  [91 %-100 %] 96 % (05/14 1517) FiO2 (%):  [40 %-70 %] 40 % (05/14 1517) Weight:  [59.5 kg] 59.5 kg (05/14 0411)  Recent Labs  Lab 06/20/22 2322 06/21/22 0348 06/21/22 0739 06/21/22 1113 06/21/22 1544  GLUCAP 130* 117* 107* 104* 112*   Recent Labs  Lab 06/17/22 0759 06/17/22 0922 06/18/22 0242 06/18/22 0616 06/18/22 0915 06/18/22 1441 06/20/22 0403 06/20/22 1520 06/20/22 2041 06/21/22 0407 06/21/22 0816  NA 144   < > 157* 157* 157*   < > 155*  150* 160* 160* 159* 162*  K 3.2*   < > 4.2 4.9 4.0  --  3.3*  --   --  3.4* 3.5  CL 112*   < > >130* >130* 129*  --  125*  --   --  124*  --   CO2 21*   < > 20* 17* 20*  --  21*  --   --  25  --   GLUCOSE 106*   < > 104* 125* 132*  --  153*  --   --  149*  --   BUN 39*   < > 39* 38* 32*  --  21*  --   --  21*  --   CREATININE 2.15*   < > 1.13* 1.16* 1.03*  --  0.73  --   --  0.87  --   CALCIUM 8.6*   < > 8.8* 9.2 8.7*  --  9.0  --   --  8.7*  --   MG 2.0  --  2.3  --   --   --   --   --   --   --   --   PHOS  --   --  2.6  --   --   --   --   --   --   --   --    < > = values in this interval not displayed.   Recent Labs  Lab 06/17/22 0145  AST 29  ALT 14  ALKPHOS 78  BILITOT 1.5*  PROT 8.9*  ALBUMIN 3.8   Recent Labs  Lab 06/17/22 0145  06/17/22 0922 06/18/22 0242 06/20/22 0403 06/21/22 0407 06/21/22 0816  WBC 22.2*  --  20.1* 15.8* 14.0*  --   NEUTROABS 19.9*  --   --   --   --   --   HGB 13.3 11.2* 9.3* 9.3* 9.2* 9.9*  HCT 43.6 33.0* 32.2* 31.5* 30.9* 29.0*  MCV 71.9*  --  75.8* 73.6* 73.2*  --   PLT 432*  --  284 300 298  --    Recent Labs  Lab 06/17/22 0759  CKTOTAL 67   No results for input(s): "LABPROT", "INR" in the last 72 hours. No results for input(s): "COLORURINE", "LABSPEC", "PHURINE", "  GLUCOSEU", "HGBUR", "BILIRUBINUR", "KETONESUR", "PROTEINUR", "UROBILINOGEN", "NITRITE", "LEUKOCYTESUR" in the last 72 hours.  Invalid input(s): "APPERANCEUR"     Component Value Date/Time   CHOL 126 06/18/2022 0242   TRIG 123 06/21/2022 0407   HDL 45 06/18/2022 0242   CHOLHDL 2.8 06/18/2022 0242   VLDL 16 06/18/2022 0242   LDLCALC 65 06/18/2022 0242   Lab Results  Component Value Date   HGBA1C 5.9 (H) 06/18/2022      Component Value Date/Time   LABOPIA NONE DETECTED 06/17/2022 0206   COCAINSCRNUR POSITIVE (A) 06/17/2022 0206   LABBENZ NONE DETECTED 06/17/2022 0206   AMPHETMU NONE DETECTED 06/17/2022 0206   THCU NONE DETECTED 06/17/2022 0206   LABBARB NONE DETECTED 06/17/2022 0206    Recent Labs  Lab 06/17/22 0145  ETH <10    I have personally reviewed the radiological images below and agree with the radiology interpretations.  DG Chest Port 1 View  Result Date: 06/21/2022 CLINICAL DATA:  Left pneumothorax EXAM: PORTABLE CHEST 1 VIEW COMPARISON:  Previous studies including the examination done earlier today FINDINGS: There is interval decrease in size of small left apical pneumothorax. There is tiny residual pneumothorax in the medial left apex in the current study. Cardiac size is within normal limits. There are no signs of pulmonary edema or new focal infiltrates. Costophrenic angles are clear. Tip of left chest tube is seen in the medial left lower lung field. Tip of NG tube is seen in the fundus of  the stomach. Tip of left IJ central venous catheter is seen in superior vena cava. Tip of endotracheal tube is 5.5 cm above the carina. IMPRESSION: There is interval decrease in size of left pneumothorax with tiny residual pneumothorax in the medial left apex. There are no new infiltrates or signs of pulmonary edema. Electronically Signed   By: Ernie Avena M.D.   On: 06/21/2022 13:24   DG CHEST PORT 1 VIEW  Result Date: 06/21/2022 CLINICAL DATA:  Pneumothorax EXAM: PORTABLE CHEST 1 VIEW COMPARISON:  CXR 06/20/22 FINDINGS: Left-sided pleural pigtail drainage catheter in place with unchanged positioning. There is a small left apical pneumothorax, likely decreased in size compared prior exam. Left-sided central venous catheter with unchanged positioning. Enteric tube courses below diaphragm with the tip out of the field of view. Endotracheal tube terminates approximately 5 cm above the carina. No pleural effusion. No focal airspace opacity. Unchanged cardiac and mediastinal contours. No radiographically apparent new displaced rib fractures. Visualized upper abdomen is unremarkable. IMPRESSION: Small left apical pneumothorax, likely decreased in size compared to prior exam. Thoracostomy tube in place. Electronically Signed   By: Lorenza Cambridge M.D.   On: 06/21/2022 10:01   DG Chest Port 1 View  Result Date: 06/20/2022 CLINICAL DATA:  Chest tube placement. EXAM: PORTABLE CHEST 1 VIEW COMPARISON:  One-view chest x-ray 06/20/2022 at 12:25 p.m. FINDINGS: The heart is enlarged. Endotracheal tube is stable. Left IJ catheter is stable. Gastric tube terminates in the fundus the stomach. A left sided pleural pigtail catheter was placed. The pneumothorax is reduced but not eliminated. IMPRESSION: 1. Interval placement of left-sided pleural pigtail catheter with reduction in left-sided pneumothorax. 2. Stable cardiomegaly without failure. Electronically Signed   By: Marin Roberts M.D.   On: 06/20/2022 17:50    DG CHEST PORT 1 VIEW  Addendum Date: 06/20/2022   ADDENDUM REPORT: 06/20/2022 16:05 ADDENDUM: Critical Value/emergent results were called by telephone at the time of interpretation on 06/20/2022 at 4:03 pm to provider Priscella Mann, RN, who  verbally acknowledged these results. Electronically Signed   By: Signa Kell M.D.   On: 06/20/2022 16:05   Result Date: 06/20/2022 CLINICAL DATA:  Hypoxia. EXAM: PORTABLE CHEST 1 VIEW COMPARISON:  06/18/2018 for FINDINGS: ETT tip is stable above the carina. There is an enteric tube with tip coursing below the field of view. Left IJ catheter tip is in the distal SVC. There is a new moderate left-sided pneumothorax overlying the apex and lateral basal left lung. No signs of pleural effusion or edema. Persistent opacities within the right middle lobe. IMPRESSION: 1. New moderate left-sided pneumothorax. 2. Stable support apparatus. 3. Persistent right middle lobe opacities. Electronically Signed: By: Signa Kell M.D. On: 06/20/2022 15:41   CT HEAD WO CONTRAST ( )  Result Date: 06/20/2022 CLINICAL DATA:  Neuro deficit with acute stroke suspected EXAM: CT HEAD WITHOUT CONTRAST TECHNIQUE: Contiguous axial images were obtained from the base of the skull through the vertex without intravenous contrast. RADIATION DOSE REDUCTION: This exam was performed according to the departmental dose-optimization program which includes automated exposure control, adjustment of the mA and/or kV according to patient size and/or use of iterative reconstruction technique. COMPARISON:  Yesterday FINDINGS: Brain: Large ICH centered at the right basal ganglia and adjacent white matter, with tracking towards the right brainstem is unchanged in shape and size, measuring up to 5 cm anterior to posterior and 4 cm craniocaudal. Regional edema is unchanged. Extensive cortical infarct in the right posterior frontal and parietal lobe. Smaller but more apparent left parietal cortex infarcts since  brain MRI yesterday. Midline shift is 5 mm. Vascular: No hyperdense vessel or unexpected calcification. Skull: Normal. Negative for fracture or focal lesion. Sinuses/Orbits: No acute finding. IMPRESSION: 1. Multiple infarcts by brain MRI yesterday, suspect progressive cortex infarction in the left parietal lobe. 2. Unchanged appearance of right ICH with adjacent edema. Midline shift is 5 mm. Electronically Signed   By: Tiburcio Pea M.D.   On: 06/20/2022 06:09   CT HEAD WO CONTRAST ( )  Result Date: 06/19/2022 CLINICAL DATA:  Follow-up ICH EXAM: CT HEAD WITHOUT CONTRAST TECHNIQUE: Contiguous axial images were obtained from the base of the skull through the vertex without intravenous contrast. RADIATION DOSE REDUCTION: This exam was performed according to the departmental dose-optimization program which includes automated exposure control, adjustment of the mA and/or kV according to patient size and/or use of iterative reconstruction technique. COMPARISON:  MRI from earlier today. FINDINGS: Brain: Large acute hemorrhage centered at the right basal ganglia and adjacent white matter without progression in size or shape when compared to prior. Cytotoxic edema most apparent in the right frontal parietal cortex, infarcts being under appreciated when compared to preceding brain MRI. Diffuse effacement of subarachnoid spaces, leftward midline shift of 5 mm. Unchanged mild dilatation of the left lateral ventricle. Vascular: No hyperdense vessel or unexpected calcification. Skull: Normal. Negative for fracture or focal lesion. Sinuses/Orbits: No acute finding. IMPRESSION: Right ICH and extensive cortical infarction that is stable from brain MRI earlier today. No new abnormality. Leftward midline shift of 5 mm. Electronically Signed   By: Tiburcio Pea M.D.   On: 06/19/2022 11:13   MR BRAIN W WO CONTRAST  Result Date: 06/19/2022 CLINICAL DATA:  Hemorrhagic stroke workup EXAM: MRI HEAD WITHOUT AND WITH CONTRAST  TECHNIQUE: Multiplanar, multiecho pulse sequences of the brain and surrounding structures were obtained without and with intravenous contrast. CONTRAST:  6mL GADAVIST GADOBUTROL 1 MMOL/ML IV SOLN COMPARISON:  Head CT and CTA from 2 days prior FINDINGS: Brain:  Multiple areas of restricted diffusion most confluent in the right parietal and posterior temporal cortex. Restricted diffusion is seen surrounding the large and known right basal ganglia hemorrhage which measures up to 5.3 cm anterior to posterior with cleft of fluid surrounding the hematoma. Infarcts in other arterial distributions are also present, including along the left cerebral convexity (especially parietooccipital)and at the right brainstem. Midline shift measures 5 mm. There is periventricular FLAIR hyperintensity suggesting chronic small vessel disease. There is asymmetric mild dilatation of the left temporal horn. Vascular: Filling defect in the right sigmoid sinus and upper internal jugular vein. Skull and upper cervical spine: Normal marrow signal Sinuses/Orbits: Mild mucosal thickening in the mastoid air cells. Retention cysts in the right maxillary sinus. Diffuse mucosal thickening in the paranasal sinuses. Negative orbits. IMPRESSION: 1. 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 The most confluent area is in the right MCA territory with infarct likely underlying the right basal ganglia hematoma. 2. 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 The pattern of infarcts and hemorrhage do not correlate with the clot location however. 3. Mild dilatation of the temporal horn of the left lateral ventricle. 5 mm of midline shift. Electronically Signed   By: Tiburcio Pea M.D.   On: 06/19/2022 06:59   DG Abd 1 View  Result Date: 06/18/2022 CLINICAL DATA:  Check gastric catheter placement EXAM: ABDOMEN - 1 VIEW COMPARISON:  Film from earlier in the  same day. FINDINGS: Gastric catheter has been advanced and now lies completely within the stomach. Scattered large and small bowel gas is noted. IMPRESSION: Gastric catheter within the stomach as described. Electronically Signed   By: Alcide Clever M.D.   On: 06/18/2022 23:47   DG Abd 1 View  Result Date: 06/18/2022 CLINICAL DATA:  Feeding tube placement. EXAM: ABDOMEN - 1 VIEW COMPARISON:  Jun 18, 2022 (6:44 p.m.) FINDINGS: A nasogastric tube is seen with its distal tip overlying the expected region of the body of the stomach. The distal side hole sits at the level of the gastroesophageal junction. The bowel gas pattern is normal. No radio-opaque calculi or other significant radiographic abnormality are seen. IMPRESSION: Nasogastric tube positioning, as described above. Further advancement of the NG tube by approximately 7 cm is recommended to decrease the risk of aspiration. Electronically Signed   By: Aram Candela M.D.   On: 06/18/2022 22:23   DG Abd Portable 1V  Result Date: 06/18/2022 CLINICAL DATA:  Check gastric catheter placement EXAM: PORTABLE ABDOMEN - 1 VIEW COMPARISON:  None Available. FINDINGS: Scattered large and small bowel gas is noted. Gastric catheter is noted with the tip in the stomach. Proximal side port lies in the distal esophagus. This should be advanced several cm deeper into the stomach. IMPRESSION: Gastric catheter as described. This should be advanced deeper into the stomach. Electronically Signed   By: Alcide Clever M.D.   On: 06/18/2022 21:24   DG CHEST PORT 1 VIEW  Result Date: 06/18/2022 CLINICAL DATA:  Evaluate central line placement EXAM: PORTABLE CHEST 1 VIEW COMPARISON:  Jun 18, 2022 FINDINGS: The side port of the NG tube is near the GE junction. The distal tip is in the stomach. The ETT is in good position. A left central line is been placed in the interval terminating in the central SVC. No pneumothorax. The left lung is clear. Right middle lobe infiltrate  remains. No other interval changes. IMPRESSION: 1. Support apparatus as  above. The side port of the NG tube is near the GE junction. Recommend advancing the tube. 2. The left lung is clear. Right middle lobe infiltrate remains. 3. No other interval changes. Electronically Signed   By: Gerome Sam III M.D.   On: 06/18/2022 17:20   Overnight EEG with video  Result Date: 06/18/2022 Charlsie Quest, MD     06/19/2022  9:37 AM Patient Name: Rebecca [name] MRN: [id number] Epilepsy Attending: Charlsie Quest Referring Physician/Provider: Erick Blinks, MD Duration: 06/17/2022 1215 to 06/18/2022 1215 Patient history: 47 y.o. female with hx of cocaine use p/w unresponsive and large R IPH with brain compression and 5mm midline shift. EEG to evaluate for seizure Level of alertness: Awake, asleep AEDs during EEG study: None Technical aspects: This EEG study was done with scalp electrodes positioned according to the 10-20 International system of electrode placement. Electrical activity was reviewed with band pass filter of 1-70Hz , sensitivity of 7 uV/mm, display speed of 47mm/sec with a 60Hz  notched filter applied as appropriate. EEG data were recorded continuously and digitally stored.  Video monitoring was available and reviewed as appropriate. Description: The posterior dominant rhythm consists of 7.5 Hz activity of moderate voltage (25-35 uV) seen predominantly in posterior head regions, symmetric and reactive to eye opening and eye closing. Sleep was characterized by vertex waves, sleep spindles (12 to 14 Hz), maximal frontocentral region. EEG showed continuous 3 to 6 Hz theta-delta slowing in right hemisphere, maximal right temporal region. Intermittent generalized 3-6hz  theta-delta slowing was also noted. Hyperventilation and photic stimulation were not performed.   ABNORMALITY - Continuous slow, right hemisphere, maximal right temporal region - Intermittent slow, generalized IMPRESSION: This study is  suggestive of cortical dysfunction arising from right hemisphere, maximal right temporal region likely secondary to underlying structural abnormality. Additionally there is mild to moderate diffuse encephalopathy. No seizures or epileptiform discharges were seen throughout the recording. Charlsie Quest   DG Chest Port 1 View  Result Date: 06/18/2022 CLINICAL DATA:  Pulmonary infiltrates EXAM: PORTABLE CHEST 1 VIEW COMPARISON:  06/17/2022 FINDINGS: Endotracheal tube is in stable position. NG tube tip in the proximal stomach with the side port near the GE junction. The NG tube appears to be buckled/looped within the neck near the thoracic inlet. Heart mediastinal contours within normal limits. Right middle lobe airspace opacity again noted, stable. No confluent opacity on the left. No effusions or acute bony abnormality. IMPRESSION: Continued right middle lobe infiltrate, unchanged. NG tube appears to be buckled slashed looped in the upper esophagus near the thoracic inlet. Tip is in the proximal stomach. Electronically Signed   By: Charlett Nose M.D.   On: 06/18/2022 07:11   DG Abd 1 View  Result Date: 06/17/2022 CLINICAL DATA:  Nasogastric tube placement. EXAM: ABDOMEN - 1 VIEW COMPARISON:  Radiograph earlier today. FINDINGS: Tip of the enteric tube is below the diaphragm in the stomach, the side port is just at the gastroesophageal junction. Normal upper abdominal bowel gas pattern. IMPRESSION: Tip of the enteric tube below the diaphragm in the stomach, side-port just at the gastroesophageal junction. Electronically Signed   By: Narda Rutherford M.D.   On: 06/17/2022 15:43   ECHOCARDIOGRAM COMPLETE  Result Date: 06/17/2022    ECHOCARDIOGRAM REPORT   Patient Name:   MELLISSA THAU Date of Exam: 06/17/2022 Medical Rec #:  [id number]          Height:       65.0 in Accession #:    [id number]  Weight:       140.0 lb Date of Birth:  10/21/1975          BSA:          1.700 m Patient Age:    46 years            BP:           122/111 mmHg Patient Gender: F                  HR:           102 bpm. Exam Location:  Inpatient Procedure: 2D Echo, Cardiac Doppler and Color Doppler Indications:    Stroke  History:        Patient has no prior history of Echocardiogram examinations.                 ICH, sepsis; Risk Factors:Current Smoker and Substance abuse.  Sonographer:    Wallie Char Referring Phys: [id number] [facility]  Sonographer Comments: Technically challenging study due to limited acoustic windows and echo performed with patient supine and on artificial respirator. IMPRESSIONS  1. Left ventricular ejection fraction, by estimation, is 50 to 55%. The left ventricle has low normal function. The left ventricle has no regional wall motion abnormalities. There is moderate left ventricular hypertrophy. Left ventricular diastolic parameters are consistent with Grade I diastolic dysfunction (impaired relaxation).  2. Right ventricular systolic function is normal. The right ventricular size is normal. There is mildly elevated pulmonary artery systolic pressure.  3. The mitral valve is normal in structure. Trivial mitral valve regurgitation. No evidence of mitral stenosis.  4. The aortic valve is tricuspid. Aortic valve regurgitation is not visualized. No aortic stenosis is present. FINDINGS  Left Ventricle: Left ventricular ejection fraction, by estimation, is 50 to 55%. The left ventricle has low normal function. The left ventricle has no regional wall motion abnormalities. The left ventricular internal cavity size was normal in size. There is moderate left ventricular hypertrophy. Left ventricular diastolic parameters are consistent with Grade I diastolic dysfunction (impaired relaxation). Right Ventricle: The right ventricular size is normal. No increase in right ventricular wall thickness. Right ventricular systolic function is normal. There is mildly elevated pulmonary artery systolic pressure. The tricuspid  regurgitant velocity is 2.74  m/s, and with an assumed right atrial pressure of 8 mmHg, the estimated right ventricular systolic pressure is 38.0 mmHg. Left Atrium: Left atrial size was normal in size. Right Atrium: Right atrial size was normal in size. Pericardium: There is no evidence of pericardial effusion. Mitral Valve: The mitral valve is normal in structure. Trivial mitral valve regurgitation. No evidence of mitral valve stenosis. MV peak gradient, 5.3 mmHg. The mean mitral valve gradient is 2.0 mmHg. Tricuspid Valve: The tricuspid valve is normal in structure. Tricuspid valve regurgitation is trivial. Aortic Valve: The aortic valve is tricuspid. Aortic valve regurgitation is not visualized. No aortic stenosis is present. Aortic valve mean gradient measures 4.0 mmHg. Aortic valve peak gradient measures 6.2 mmHg. Aortic valve area, by VTI measures 2.39 cm. Pulmonic Valve: The pulmonic valve was normal in structure. Pulmonic valve regurgitation is not visualized. Aorta: The aortic root and ascending aorta are structurally normal, with no evidence of dilitation. IAS/Shunts: The interatrial septum was not well visualized.  LEFT VENTRICLE PLAX 2D LVIDd:         4.50 cm     Diastology LVIDs:         3.30 cm     LV e'  medial:    6.63 cm/s LV PW:         1.10 cm     LV E/e' medial:  11.0 LV IVS:        1.00 cm     LV e' lateral:   5.86 cm/s LVOT diam:     1.90 cm     LV E/e' lateral: 12.4 LV SV:         40 LV SV Index:   23 LVOT Area:     2.84 cm  LV Volumes (MOD) LV vol d, MOD A2C: 68.4 ml LV vol d, MOD A4C: 77.5 ml LV vol s, MOD A2C: 33.9 ml LV vol s, MOD A4C: 37.8 ml LV SV MOD A2C:     34.5 ml LV SV MOD A4C:     77.5 ml LV SV MOD BP:      39.2 ml RIGHT VENTRICLE             IVC RV S prime:     13.90 cm/s  IVC diam: 1.50 cm TAPSE (M-mode): 1.6 cm LEFT ATRIUM             Index        RIGHT ATRIUM           Index LA diam:        3.00 cm 1.76 cm/m   RA Area:     10.40 cm LA Vol (A2C):   23.2 ml 13.65 ml/m  RA  Volume:   20.40 ml  12.00 ml/m LA Vol (A4C):   19.2 ml 11.29 ml/m LA Biplane Vol: 21.2 ml 12.47 ml/m  AORTIC VALVE AV Area (Vmax):    2.85 cm AV Area (Vmean):   2.41 cm AV Area (VTI):     2.39 cm AV Vmax:           124.00 cm/s AV Vmean:          93.450 cm/s AV VTI:            0.166 m AV Peak Grad:      6.2 mmHg AV Mean Grad:      4.0 mmHg LVOT Vmax:         124.50 cm/s LVOT Vmean:        79.500 cm/s LVOT VTI:          0.140 m LVOT/AV VTI ratio: 0.84  AORTA Ao Root diam: 3.30 cm Ao Asc diam:  2.80 cm MITRAL VALVE               TRICUSPID VALVE MV Area (PHT): 4.06 cm    TR Peak grad:   30.0 mmHg MV Area VTI:   1.72 cm    TR Vmax:        274.00 cm/s MV Peak grad:  5.3 mmHg MV Mean grad:  2.0 mmHg    SHUNTS MV Vmax:       1.15 m/s    Systemic VTI:  0.14 m MV Vmean:      68.2 cm/s   Systemic Diam: 1.90 cm MV Decel Time: 187 msec MV E velocity: 72.90 cm/s MV A velocity: 89.60 cm/s MV E/A ratio:  0.81 Epifanio Lesches MD Electronically signed by Epifanio Lesches MD Signature Date/Time: 06/17/2022/10:47:05 AM    Final    CT VENOGRAM HEAD  Result Date: 06/17/2022 CLINICAL DATA:  Intracranial hemorrhage. EXAM: CT VENOGRAM HEAD TECHNIQUE: Venographic phase images of the brain were obtained following the administration of intravenous contrast. Multiplanar reformats and maximum  intensity projections were generated. RADIATION DOSE REDUCTION: This exam was performed according to the departmental dose-optimization program which includes automated exposure control, adjustment of the mA and/or kV according to patient size and/or use of iterative reconstruction technique. CONTRAST:  75mL OMNIPAQUE IOHEXOL 350 MG/ML SOLN COMPARISON:  None Available. FINDINGS: Filling defect of the right transverse sinus and right transverse sigmoid junction which are somewhat large and tubular for arachnoid granulations, although an arachnoid granulation at the left transverse sigmoid junction appears nearly similar. The location  would also not explain the acute hemorrhage. No visible cortical or central venous thrombosis. IMPRESSION: No cortical or central venous thrombosis to explain the ICH. There are filling defects at the right transverse and transverse sigmoid dural sinuses which are prominent for arachnoid granulations but not definitive for thrombus. Attention at follow-up imaging. Electronically Signed   By: Tiburcio Pea M.D.   On: 06/17/2022 05:30   CT ANGIO HEAD NECK W WO CM  Result Date: 06/17/2022 CLINICAL DATA:  Found unresponsive. Intracranial hemorrhage. History of cocaine use EXAM: CT ANGIOGRAPHY HEAD AND NECK WITH AND WITHOUT CONTRAST TECHNIQUE: Multidetector CT imaging of the head and neck was performed using the standard protocol during bolus administration of intravenous contrast. Multiplanar CT image reconstructions and MIPs were obtained to evaluate the vascular anatomy. Carotid stenosis measurements (when applicable) are obtained utilizing NASCET criteria, using the distal internal carotid diameter as the denominator. RADIATION DOSE REDUCTION: This exam was performed according to the departmental dose-optimization program which includes automated exposure control, adjustment of the mA and/or kV according to patient size and/or use of iterative reconstruction technique. CONTRAST:  75mL OMNIPAQUE IOHEXOL 350 MG/ML SOLN COMPARISON:  Head CT from earlier today FINDINGS: CTA NECK FINDINGS Aortic arch: Unremarkable Right carotid system: Mixed density plaque at the bifurcation with some low-density plaque bulging into the ICA bulb. No ulceration or flow limiting stenosis Left carotid system: Mainly low-density plaque at the bifurcation without stenosis or ulceration. Vertebral arteries: No proximal subclavian stenosis. The vertebral arteries are somewhat tortuous but smoothly contoured and diffusely patent Skeleton: No acute or aggressive finding. Other neck: No acute finding Upper chest: Mild opacification of  dependent lungs which could be atelectasis. Centrilobular emphysema. Review of the MIP images confirms the above findings CTA HEAD FINDINGS Anterior circulation: No aneurysm or spot sign seen underlying the ICH. There is a aneurysm projecting leftward from the left carotid terminus which measures 4 mm. No adjacent hemorrhage. No major branch occlusion. There is undulation of the bilateral intracranial branches which is likely atheromatous given findings in the neck. No segmental beading. Some less intense flow and right MCA branches is likely related to mass effect by the ICH. Posterior circulation: The vertebral and basilar arteries arediffusely patent. Undulation of the left more than right PCA with up to moderate narrowing on the left, likely atheromatous given the findings in the neck. Venous sinuses: Reference dedicated CT venogram Anatomic variants: No acute finding Review of the MIP images confirms the above findings IMPRESSION: 1. No vascular lesion or spot sign seen at the acute ICH. 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 3. 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 Electronically Signed   By: Tiburcio Pea M.D.   On: 06/17/2022 05:18   CT Head Wo Contrast  Result Date: 06/17/2022 CLINICAL DATA:  Altered mental status EXAM: CT HEAD WITHOUT CONTRAST TECHNIQUE: Contiguous axial images were obtained from the base of the skull through the vertex without intravenous contrast. RADIATION DOSE REDUCTION: This exam was  performed according to the departmental dose-optimization program which includes automated exposure control, adjustment of the mA and/or kV according to patient size and/or use of iterative reconstruction technique. COMPARISON:  None Available. FINDINGS: Brain: There is a large area of parenchymal hemorrhage identified in the right cerebral hemisphere centered primarily within the basal ganglia and extending superiorly into the centrum semi ovale. This measures  approximately 4.7 x 4.0 cm in greatest AP and transverse dimensions respectively. It extends for approximately 4.7 cm in craniocaudad dimension. Significant mass-effect upon the right lateral ventricle is noted. Mild midline shift of approximately 5 mm is noted. Some surrounding edema is seen as well. No other hemorrhage is noted. Relative loss of the sulcal markings is noted likely related to some increased intracranial pressure. Vascular: No hyperdense vessel or unexpected calcification. Skull: Normal. Negative for fracture or focal lesion. Sinuses/Orbits: No acute finding. Other: None. IMPRESSION: Right-sided intraparenchymal hemorrhage as described with associated edema and mild midline shift of 5 mm from right to left. Critical Value/emergent results were called by telephone at the time of interpretation on 06/17/2022 at 2:27 am to Dr. Donna Bernard , who verbally acknowledged these results. Electronically Signed   By: Alcide Clever M.D.   On: 06/17/2022 02:29   DG Abdomen 1 View  Result Date: 06/17/2022 CLINICAL DATA:  Check gastric catheter placement EXAM: ABDOMEN - 1 VIEW COMPARISON:  None Available. FINDINGS: Gastric catheter is noted extending into the stomach. Proximal side port lies at the gastroesophageal junction. This should be advanced further into the stomach. No free air is seen. No bony abnormality is noted. IMPRESSION: Gastric catheter as described. Electronically Signed   By: Alcide Clever M.D.   On: 06/17/2022 02:16   DG Chest Port 1 View  Result Date: 06/17/2022 CLINICAL DATA:  Check endotracheal tube placement EXAM: PORTABLE CHEST 1 VIEW COMPARISON:  10/21/2020 FINDINGS: Cardiac shadow is within normal limits. Endotracheal tube is noted 1.5 cm above the carina. Gastric catheter is noted extending into the stomach. Patchy increased density is noted in the right middle lobe beneath the minor fissure consistent with acute infiltrate. No sizable effusion is noted. No bony abnormality is seen.  IMPRESSION: Right middle lobe infiltrate. Tubes and lines as described. Electronically Signed   By: Alcide Clever M.D.   On: 06/17/2022 02:16     PHYSICAL EXAM  Temp:  [96.3 F (35.7 C)-100 F (37.8 C)] 99.3 F (37.4 C) (05/14 1543) Pulse Rate:  [76-142] 114 (05/14 1430) Resp:  [19-43] 23 (05/14 1430) BP: (108-190)/(74-118) 117/78 (05/14 1430) SpO2:  [91 %-100 %] 96 % (05/14 1517) FiO2 (%):  [40 %-70 %] 40 % (05/14 1517) Weight:  [59.5 kg] 59.5 kg (05/14 0411)  General - Well nourished, well developed, intubated on sedation.  Ophthalmologic - fundi not visualized due to noncooperation.  Cardiovascular - Regular rhythm but tachycardia.  Neuro - intubated on precedex and fentanyl, not open eyes on voice, not following commands. With forced eye opening, right pupil 1.5 mm, left pupil 3 mm, both not reactive to light.  Corneal reflex absent, cough absent. Breathing over the vent.  Facial symmetry not able to test due to ET tube.  Tongue protrusion not cooperative. On pain stimulation, no movement in all extremities. Sensation, coordination and gait not tested.   ASSESSMENT/PLAN Ms. Rebecca [name] is a 47 y.o. female with history of cocaine abuse admitted for unresponsiveness.  Intubated in ER for airway protection.  No tPA given due to ICH.    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 Vasospasm from ICH vs. Cocaine vasculopathy CVST: right sigmoid sinus and IJ DVST, could be from mass effect CT right BG and CR large ICH, 5 mm midline shift CT head and neck 4 mm left terminal ICA aneurysm CTV no venous thrombosis MRI multiple acute infarcts in various distributions suggesting central embolic disease with confluent area in right MCA territory with infarct likely underlying right basal ganglia ICH, dural venous sinus thrombosis affecting right sigmoid  sinus and extending into upper right IJ, 5 mm of midline shift CT repeat 5/12 stable ICH with leftward midline shift of 5 mm CT repeat 5/13 Multiple infarcts by brain MRI yesterday, suspect progressive cortex infarction in the left parietal lobe.  2D Echo EF 50 to 55% LDL 65 HgbA1c 5.9 UDS positive for cocaine Heparin subcu for VTE prophylaxis No antithrombotic prior to admission, now on No antithrombotic due to ICH. Therapy recommendations: Pending Disposition: Pending, prognosis poor, palliative care on board. Daughter is coming in 1-2 days for decision making  Cerebral edema CT right BG and CR large ICH, 5 mm midline shift CT repeat 5/12 stable ICH with leftward midline shift of 5 mm CT repeat 5/13 unchanged appearance of right ICH with adjacent edema. Midline shift is 5 mm. Na 151->155->157->159-> 155->158->150->159->162 off 3% saline -> FW now Na monitoring LTM EEG cortical dysfunction arising from right hemisphere with diffuse encephalopathy, no seizures  Respiratory failure Aspiration pneumonia Leukocytosis Pneumothorax  Intubated on vent Still on fentanyl CCM on board CXR right middle lobe infiltrate On azithromycin and Rocephin -> unasyn Tmax 100.6->100.3->afebrile WBC 22.2->20.1->15.8->14.0 S/p chest tube  Hypertension BP unstable Off cleviprex -> resumed cleviprex  Labetalol and hydralazine IV PRN BP goal < 160 Long term BP goal normotensive  AKI Creatinine 2.15-1.32-1.03-0.73-0.87 On tube feeding and IVF  UTI UA WBC 21-50 On Rocephin Urine culture + STREPTOCOCCUS AGALACTIAE   Cocaine abuse History of cocaine abuse UDS positive for cocaine Cessation education will be provided  Dysphagia On tube feeding  Other Stroke Risk Factors   Other Active Problems Cerebral aneurysm, CTA head and neck showed 4 mm left terminal ICA aneurysm, asymptomatic, outpatient follow-up  Hospital day # 4  Marvel Plan, MD PhD Stroke Neurology 06/21/2022 4:21  PM  This patient is critically ill due to large ICH, stroke, dural venous sinus thrombosis, cerebral edema, cocaine abuse, AKI, pneumonia, UTI and at significant risk of neurological worsening, death form reherniation, brain death, seizure, sepsis. This patient's care requires constant monitoring of vital signs, hemodynamics, respiratory and cardiac monitoring, review of multiple databases, neurological assessment, discussion with family, other specialists and medical decision making of high complexity. I spent 35 minutes of neurocritical care time in the care of this patient. I had long discussion with daughter over the phone at bedside, updated pt current condition, treatment plan and potential prognosis, and answered all the questions. She expressed understanding and appreciation.    To contact Stroke Continuity provider, please refer to [email or web address]. After hours, contact General Neurology

## 2022-06-21 NOTE — Progress Notes (Addendum)
NAME:  Rebecca [name], MRN:  [id number], DOB:  May 13, 1975, LOS: 4 ADMISSION DATE:  06/17/2022, CONSULTATION DATE:  06/21/22 REFERRING MD:  EDP, CHIEF COMPLAINT:  unresponsive   History of Present Illness:  47 yo female with hx of polysubstance abuse presented brought to [facility] ED with AMS.  Intubated for airway protection.  CT head showed large Rt ICH with 5 mm midline shift.  UDS positive for cocaine.  Transferred to [facility] for further management.  PCCM consulted to assist with management in ICU.   Hx from chart and medical team.  Pertinent  Medical History  Substance abuse  Significant Hospital Events: Including procedures, antibiotic start and stop dates in addition to other pertinent events   5/10 Admit, neurosurgery consulted 5/11 remains on ventilator with hypertonic saline and EEG in place. CVC placed.  5/12 no acute events overnight, MRI obtained results pending  Interim History / Subjective:  -Cleviprex stopped in the afternoon, restarted overnight.  -Pneumothorax on CXR yesterday, chest tube placed -Vent now on PEEP 8, FIO2 60%  Objective   Blood pressure (!) 140/85, pulse (!) 112, temperature 97.7 F (36.5 C), resp. rate (!) 30, height 5\' 5"  (1.651 m), weight 59.5 kg, SpO2 96 %.    Vent Mode: PRVC FiO2 (%):  [70 %-100 %] 70 % Set Rate:  [18 bmp] 18 bmp Vt Set:  [480 mL] 480 mL PEEP:  [5 cmH20-8 cmH20] 8 cmH20 Plateau Pressure:  [11 cmH20-24 cmH20] 11 cmH20   Intake/Output Summary (Last 24 hours) at 06/21/2022 4098 Last data filed at 06/21/2022 0600 Gross per 24 hour  Intake 2547.49 ml  Output 2325 ml  Net 222.49 ml   Filed Weights   06/19/22 0500 06/20/22 0700 06/21/22 0411  Weight: 60.8 kg 60 kg 59.5 kg   Physical exam General: Acute ill-appearing adult female lying in bed on mechanical ventilation in no acute distress HEENT: ETT, MM pink/moist Neuro: Pupils unequal L>R.  Cough intact. Withdraws slightly to pain in RUE, RLE, flicker in LLE.  CV: s1s2 regular  rate and rhythm, no murmur, rubs, or gallops. PULM: Rhonchi bilaterally. No increased work of breathing, tolerating ventilator. GI: Moderate distension, but soft. BS+ Tolerating TF Extremities: warm/dry, no edema  Skin: no rashes or lesions  Resolved Hospital Problem list     Assessment & Plan:   Right basal ganglia hemorrhage with mass effect and brain compression with mid-line shift R MCA Stroke and B ACA punctate infarcts R Sigmoid sinus thrombosis and IJ DVST Patient initially admitted with IPH with mass effect, now found to have further sequelae of stroke, new infarcts and sinus thrombosis on MRI. Mental status is unchanged and remains comatose, pupils are more reactive today but overall clinical picture is unchanged with poor prognosis for functional recovery.  -Primary management per neurology, appreciate assistance -Hypertonic saline per neuro -Trend sodium -AEDs per neuro -Maintain neuroprotective measures  Acute Hypoxemic Respiratory Failure CAP vs aspiration PNA Overall stable following chest tube placement for pneumothorax. Vent settings were increased yesterday, able to wean down slightly this morning. Leukocytosis improving with no signs of worsening infection. She will need to remain intubated for airway protection secondary to mental status that has remained unchanged since admission.  P: -Continue Unasyn  -CXR, ABG  -Continue ventilator support with lung protective strategies  -Wean PEEP and FiO2 for sats greater than 90%. -Head of bed elevated 30 degrees. -Plateau pressures less than 30 cm H20.  -Ensure adequate pulmonary hygiene  -Follow cultures  -VAP bundle  in place  -PAD protocol  Hypertension  Hypertensive overnight and restarted cleviprex.  -Start cleviprex for BP goal<160 -Amlodipine 10mg  daily  Hypokalemia K 3.4 today, replete today -Continue to monitor  Anemia P: -Transfuse per protocol -Hemoglobin goal 7 -Monitor for signs of  bleeding  Urinary tract infection present on admission Urinalysis with many bacteria, moderate leukocytes, but negative nitrates.  Due to poor mental status and current intubation we will to determine if patient is symptomatic. Treated with CTX x 4 days with empiric PNA coverage.   New HFrEF, not decompensated Euvolemic on exam, continue to assess if diuresis needed.  -Echocardiogram completed 06/16/2022 with EF 50 to 55%, moderate left ventricular hypertrophy and grade 1 diastolic dysfunction  Elevated troponin in setting of cocaine abuse -High-sensitivity troponin peaked at 708 P: Continuous telemetry Optimize electrolytes  AKI from hypovolemia - resolved Baseline creatinine 0.87 from 10/21/20, creatinine on admission 2.03 GFR 30. Now back to baseline, continue to monitor.   At risk malnutrition P: Continue tube feeds  Best Practice:   Diet/type: tubefeeds DVT prophylaxis: SCD GI prophylaxis: PPI Lines: N/A Foley:  Yes, and it is still needed Code Status:  full code Last date of multidisciplinary goals of care discussion: Per primary  Critical care time:

## 2022-06-21 NOTE — Progress Notes (Signed)
OT Cancellation Note  Patient Details Name: Rebecca [name] MRN: [id number] DOB: 31-Aug-1975   Cancelled Treatment:    Reason Eval/Treat Not Completed: Patient not medically ready;Medical issues which prohibited therapy (Per MD, pt is not appropriate for therapies at this time. Plan to sign off and await new orders if pt progresses.)  Donia Pounds 06/21/2022, 9:20 AM

## 2022-06-21 NOTE — Progress Notes (Signed)
PT Cancellation Note  Patient Details Name: Rebecca [name] MRN: [id number] DOB: 18-Mar-1975   Cancelled Treatment:    Reason Eval/Treat Not Completed: (P) Patient not medically ready. Discussed pt case with MD, who reported PT and OT can sign off for now and they will re-consult [name] if needed later.   Raymond Gurney, PT, DPT Acute Rehabilitation Services  Office: [phone]    Jewel Baize 06/21/2022, 9:04 AM

## 2022-06-21 NOTE — Progress Notes (Signed)
eLink Physician-Brief Progress Note Patient Name: Rebecca [name] DOB: [birth year] MRN: [id number]   Date of Service  06/21/2022  HPI/Events of Note  Serum sodium 161.  eICU Interventions  Serum sodium trending down after 3 % saline gtt placed on hold.        Migdalia Dk 06/21/2022, 10:41 PM

## 2022-06-22 DIAGNOSIS — I6389 Other cerebral infarction: Secondary | ICD-10-CM | POA: Diagnosis not present

## 2022-06-22 DIAGNOSIS — Z9689 Presence of other specified functional implants: Secondary | ICD-10-CM | POA: Diagnosis not present

## 2022-06-22 DIAGNOSIS — J9601 Acute respiratory failure with hypoxia: Secondary | ICD-10-CM | POA: Diagnosis not present

## 2022-06-22 DIAGNOSIS — I610 Nontraumatic intracerebral hemorrhage in hemisphere, subcortical: Secondary | ICD-10-CM | POA: Diagnosis not present

## 2022-06-22 LAB — BASIC METABOLIC PANEL
Anion gap: 6 (ref 5–15)
BUN: 31 mg/dL — ABNORMAL HIGH (ref 6–20)
CO2: 27 mmol/L (ref 22–32)
Calcium: 8.8 mg/dL — ABNORMAL LOW (ref 8.9–10.3)
Chloride: 127 mmol/L — ABNORMAL HIGH (ref 98–111)
Creatinine, Ser: 0.97 mg/dL (ref 0.44–1.00)
GFR, Estimated: >60 mL/min (ref >60)
Glucose, Bld: 116 mg/dL — ABNORMAL HIGH (ref 70–99)
Potassium: 3.9 mmol/L (ref 3.5–5.1)
Sodium: 160 mmol/L — ABNORMAL HIGH (ref 135–145)

## 2022-06-22 LAB — SODIUM
Sodium: 161 mmol/L (ref 135–145)
Sodium: 159 mmol/L — ABNORMAL HIGH (ref 135–145)
Sodium: 158 mmol/L — ABNORMAL HIGH (ref 135–145)

## 2022-06-22 LAB — CBC
HCT: 28.3 % — ABNORMAL LOW (ref 36.0–46.0)
Hemoglobin: 8.3 g/dL — ABNORMAL LOW (ref 12.0–15.0)
MCH: 22 pg — ABNORMAL LOW (ref 26.0–34.0)
MCHC: 29.3 g/dL — ABNORMAL LOW (ref 30.0–36.0)
MCV: 75.1 fL — ABNORMAL LOW (ref 80.0–100.0)
Platelets: 297 10*3/uL (ref 150–400)
RBC: 3.77 MIL/uL — ABNORMAL LOW (ref 3.87–5.11)
RDW: 19.8 % — ABNORMAL HIGH (ref 11.5–15.5)
WBC: 13.5 10*3/uL — ABNORMAL HIGH (ref 4.0–10.5)
nRBC: 0.3 % — ABNORMAL HIGH (ref 0.0–0.2)

## 2022-06-22 LAB — GLUCOSE, CAPILLARY
Glucose-Capillary: 103 mg/dL — ABNORMAL HIGH (ref 70–99)
Glucose-Capillary: 110 mg/dL — ABNORMAL HIGH (ref 70–99)
Glucose-Capillary: 94 mg/dL (ref 70–99)
Glucose-Capillary: 94 mg/dL (ref 70–99)
Glucose-Capillary: 95 mg/dL (ref 70–99)
Glucose-Capillary: 96 mg/dL (ref 70–99)

## 2022-06-22 LAB — CULTURE, BLOOD (ROUTINE X 2): Culture: NO GROWTH

## 2022-06-22 MED ORDER — LINEZOLID 600 MG/300ML IV SOLN
600.0000 mg | Freq: Two times a day (BID) | INTRAVENOUS | Status: AC
  Administered 2022-06-22 – 2022-06-29 (×16): 600 mg via INTRAVENOUS
  Filled 2022-06-22 (×16): qty 300

## 2022-06-22 NOTE — Progress Notes (Addendum)
NAME:  Rebecca [name], MRN:  [id number], DOB:  02-24-1975, LOS: 5 ADMISSION DATE:  06/17/2022, CONSULTATION DATE:  06/22/22 REFERRING MD:  EDP, CHIEF COMPLAINT:  unresponsive   History of Present Illness:  47 yo female with hx of polysubstance abuse presented brought to [facility] ED with AMS.  Intubated for airway protection.  CT head showed large Rt ICH with 5 mm midline shift.  UDS positive for cocaine.  Transferred to [facility] for further management.  PCCM consulted to assist with management in ICU.   Hx from chart and medical team.  Pertinent  Medical History  Substance abuse  Significant Hospital Events: Including procedures, antibiotic start and stop dates in addition to other pertinent events   5/10 Admit, neurosurgery consulted 5/11 remains on ventilator with hypertonic saline and EEG in place. CVC placed.  5/12 no acute events overnight, MRI obtained results pending  Interim History / Subjective:  -Vtach yesterday afternoon -Off Cleviprex overnight.  - chest tube output overnight -Vent unchanged on PEEP 8, FIO2 60%  Objective   Blood pressure (!) 135/90, pulse (!) 105, temperature (!) 96.8 F (36 C), resp. rate (!) 26, height 5\' 5"  (1.651 m), weight 60.7 kg, SpO2 98 %.    Vent Mode: PRVC FiO2 (%):  [40 %] 40 % Set Rate:  [18 bmp] 18 bmp Vt Set:  [480 mL] 480 mL PEEP:  [8 cmH20] 8 cmH20 Plateau Pressure:  [20 cmH20-25 cmH20] 20 cmH20   Intake/Output Summary (Last 24 hours) at 06/22/2022 0838 Last data filed at 06/22/2022 0700 Gross per 24 hour  Intake 2226.65 ml  Output 700 ml  Net 1526.65 ml   Filed Weights   06/20/22 0700 06/21/22 0411 06/22/22 0137  Weight: 60 kg 59.5 kg 60.7 kg   Physical exam General: Acute ill-appearing adult female lying in bed on mechanical ventilation in no acute distress HEENT: ETT, MM pink/moist Neuro: Pupils unequal L>R, L sluggish, R reactive. Cough and gag intact. Withdraws to pain in RUE, RLE, flicker in LLE. Babinski intact  bilaterally.  CV: s1s2 regular rate and rhythm, no murmur, rubs, or gallops. PULM: Improved lung sounds on left, sounds more clear bilaterally. No increased work of breathing, tolerating ventilator. GI: Moderate distension, but soft. BS+ Tolerating TF Extremities: warm/dry, no edema  Skin: no rashes or lesions  Resolved Hospital Problem list     Assessment & Plan:   Right basal ganglia hemorrhage with mass effect and brain compression with mid-line shift R MCA Stroke and B ACA punctate infarcts R Sigmoid sinus thrombosis and IJ DVST Neuro exam is much improved today and is able to follow commands, although continues to have left-sided motor deficits in the extremities.  -Primary management per neurology, appreciate assistance -Hypertonic saline off -Trend sodium -AEDs per neuro -Maintain neuroprotective measures  Acute Hypoxemic Respiratory Failure CAP vs aspiration PNA L Pneumothorax  Respiratory status is improving - she has more air movement on the left side on exam and lungs sound more clear bilaterally. Vent remained unchanged and she has weaned back down to minimal settings.  Sputum cultures positive for MRSA but has been clinically improving with current antibiotic regimen, suspect she has colonization without active infection. She remains intubated for airway protection for mental status. Chest tube output appropriate, leaving in while intubated. P: -Continue Unasyn  -Continue ventilator support with lung protective strategies  -Wean PEEP and FiO2 for sats greater than 90%. -Head of bed elevated 30 degrees. -Plateau pressures less than 30 cm H20.  -Ensure  adequate pulmonary hygiene  -Follow cultures  -VAP bundle in place  -PAD protocol  Hypertension  Hypertension improved and off cleviprex overnight, continue to monitor.  -Cleviprex, hydralazine for BP goal<160 -Amlodipine 10mg  daily -Lisinopril 40mg  daily  New HFrEF, not decompensated NSVT Patient had run of NSVT  yesterday for 2.5 seconds in setting of known HEFrEF and severe illness, unable to assess if symptomatic given mental status, will continue to monitor.  -Echocardiogram completed 06/16/2022 with EF 50 to 55%, moderate left ventricular hypertrophy and grade 1 diastolic dysfunction  Hypokalemia K 3.9 today.  -Continue to monitor  Anemia P: -Transfuse per protocol -Hemoglobin goal 7 -Monitor for signs of bleeding  Urinary tract infection present on admission Urinalysis with many bacteria, moderate leukocytes, but negative nitrates.  Due to poor mental status and current intubation we will to determine if patient is symptomatic. Treated with CTX x 4 days with empiric PNA coverage.   Elevated troponin in setting of cocaine abuse -High-sensitivity troponin peaked at 708 P: Continuous telemetry Optimize electrolytes  AKI from hypovolemia - resolved Baseline creatinine 0.87 from 10/21/20, creatinine on admission 2.03 GFR 30. Now back to baseline, continue to monitor.   At risk malnutrition P: Continue tube feeds  Best Practice:   Diet/type: tubefeeds DVT prophylaxis: SCD GI prophylaxis: PPI Lines: N/A Foley:  Yes, and it is still needed Code Status:  full code Last date of multidisciplinary goals of care discussion: Per primary  Critical care time:

## 2022-06-22 NOTE — Progress Notes (Signed)
STROKE TEAM PROGRESS NOTE   SUBJECTIVE (INTERVAL HISTORY) No family at the bedside. Pt on low dose fentanyl. Open eyes on voice and blinking to visual threat on the right. Left pupil still bigger than right but seems smaller than yesterday and now reactive to light. Moving right UE and LE with pain.    OBJECTIVE Temp:  [96.6 F (35.9 C)-100.2 F (37.9 C)] 97.5 F (36.4 C) (05/15 1152) Pulse Rate:  [89-133] 93 (05/15 1100) Cardiac Rhythm: Sinus tachycardia (05/15 0800) Resp:  [19-43] 29 (05/15 1100) BP: (102-165)/(70-101) 153/81 (05/15 1100) SpO2:  [93 %-100 %] 96 % (05/15 1117) FiO2 (%):  [40 %] 40 % (05/15 0800) Weight:  [60.7 kg] 60.7 kg (05/15 0137)  Recent Labs  Lab 06/21/22 1907 06/21/22 2308 06/22/22 0310 06/22/22 0750 06/22/22 1151  GLUCAP 121* 99 94 95 103*   Recent Labs  Lab 06/17/22 0759 06/17/22 0922 06/18/22 0242 06/18/22 0616 06/18/22 0915 06/18/22 1441 06/20/22 0403 06/20/22 1520 06/21/22 0407 06/21/22 0816 06/21/22 2000 06/22/22 0329 06/22/22 0833  NA 144   < > 157* 157* 157*   < > 155*  150*   < > 159* 162* 161* 160* 161*  K 3.2*   < > 4.2 4.9 4.0  --  3.3*  --  3.4* 3.5  --  3.9  --   CL 112*   < > >130* >130* 129*  --  125*  --  124*  --   --  127*  --   CO2 21*   < > 20* 17* 20*  --  21*  --  25  --   --  27  --   GLUCOSE 106*   < > 104* 125* 132*  --  153*  --  149*  --   --  116*  --   BUN 39*   < > 39* 38* 32*  --  21*  --  21*  --   --  31*  --   CREATININE 2.15*   < > 1.13* 1.16* 1.03*  --  0.73  --  0.87  --   --  0.97  --   CALCIUM 8.6*   < > 8.8* 9.2 8.7*  --  9.0  --  8.7*  --   --  8.8*  --   MG 2.0  --  2.3  --   --   --   --   --   --   --   --   --   --   PHOS  --   --  2.6  --   --   --   --   --   --   --   --   --   --    < > = values in this interval not displayed.   Recent Labs  Lab 06/17/22 0145  AST 29  ALT 14  ALKPHOS 78  BILITOT 1.5*  PROT 8.9*  ALBUMIN 3.8   Recent Labs  Lab 06/17/22 0145 06/17/22 0922  06/18/22 0242 06/20/22 0403 06/21/22 0407 06/21/22 0816 06/22/22 0329  WBC 22.2*  --  20.1* 15.8* 14.0*  --  13.5*  NEUTROABS 19.9*  --   --   --   --   --   --   HGB 13.3   < > 9.3* 9.3* 9.2* 9.9* 8.3*  HCT 43.6   < > 32.2* 31.5* 30.9* 29.0* 28.3*  MCV 71.9*  --  75.8* 73.6* 73.2*  --  75.1*  PLT 432*  --  284 300 298  --  297   < > = values in this interval not displayed.   Recent Labs  Lab 06/17/22 0759  CKTOTAL 67   No results for input(s): "LABPROT", "INR" in the last 72 hours. No results for input(s): "COLORURINE", "LABSPEC", "PHURINE", "GLUCOSEU", "HGBUR", "BILIRUBINUR", "KETONESUR", "PROTEINUR", "UROBILINOGEN", "NITRITE", "LEUKOCYTESUR" in the last 72 hours.  Invalid input(s): "APPERANCEUR"     Component Value Date/Time   CHOL 126 06/18/2022 0242   TRIG 123 06/21/2022 0407   HDL 45 06/18/2022 0242   CHOLHDL 2.8 06/18/2022 0242   VLDL 16 06/18/2022 0242   LDLCALC 65 06/18/2022 0242   Lab Results  Component Value Date   HGBA1C 5.9 (H) 06/18/2022      Component Value Date/Time   LABOPIA NONE DETECTED 06/17/2022 0206   COCAINSCRNUR POSITIVE (A) 06/17/2022 0206   LABBENZ NONE DETECTED 06/17/2022 0206   AMPHETMU NONE DETECTED 06/17/2022 0206   THCU NONE DETECTED 06/17/2022 0206   LABBARB NONE DETECTED 06/17/2022 0206    Recent Labs  Lab 06/17/22 0145  ETH <10    I have personally reviewed the radiological images below and agree with the radiology interpretations.  DG Chest Port 1 View  Result Date: 06/21/2022 CLINICAL DATA:  Left pneumothorax EXAM: PORTABLE CHEST 1 VIEW COMPARISON:  Previous studies including the examination done earlier today FINDINGS: There is interval decrease in size of small left apical pneumothorax. There is tiny residual pneumothorax in the medial left apex in the current study. Cardiac size is within normal limits. There are no signs of pulmonary edema or new focal infiltrates. Costophrenic angles are clear. Tip of left chest tube is  seen in the medial left lower lung field. Tip of NG tube is seen in the fundus of the stomach. Tip of left IJ central venous catheter is seen in superior vena cava. Tip of endotracheal tube is 5.5 cm above the carina. IMPRESSION: There is interval decrease in size of left pneumothorax with tiny residual pneumothorax in the medial left apex. There are no new infiltrates or signs of pulmonary edema. Electronically Signed   By: Ernie Avena M.D.   On: 06/21/2022 13:24   DG CHEST PORT 1 VIEW  Result Date: 06/21/2022 CLINICAL DATA:  Pneumothorax EXAM: PORTABLE CHEST 1 VIEW COMPARISON:  CXR 06/20/22 FINDINGS: Left-sided pleural pigtail drainage catheter in place with unchanged positioning. There is a small left apical pneumothorax, likely decreased in size compared prior exam. Left-sided central venous catheter with unchanged positioning. Enteric tube courses below diaphragm with the tip out of the field of view. Endotracheal tube terminates approximately 5 cm above the carina. No pleural effusion. No focal airspace opacity. Unchanged cardiac and mediastinal contours. No radiographically apparent new displaced rib fractures. Visualized upper abdomen is unremarkable. IMPRESSION: Small left apical pneumothorax, likely decreased in size compared to prior exam. Thoracostomy tube in place. Electronically Signed   By: Lorenza Cambridge M.D.   On: 06/21/2022 10:01   DG Chest Port 1 View  Result Date: 06/20/2022 CLINICAL DATA:  Chest tube placement. EXAM: PORTABLE CHEST 1 VIEW COMPARISON:  One-view chest x-ray 06/20/2022 at 12:25 p.m. FINDINGS: The heart is enlarged. Endotracheal tube is stable. Left IJ catheter is stable. Gastric tube terminates in the fundus the stomach. A left sided pleural pigtail catheter was placed. The pneumothorax is reduced but not eliminated. IMPRESSION: 1. Interval placement of left-sided pleural pigtail catheter with reduction in left-sided pneumothorax. 2. Stable cardiomegaly without  failure. Electronically Signed   By: Marin Roberts M.D.   On: 06/20/2022 17:50   DG CHEST PORT 1 VIEW  Addendum Date: 06/20/2022   ADDENDUM REPORT: 06/20/2022 16:05 ADDENDUM: Critical Value/emergent results were called by telephone at the time of interpretation on 06/20/2022 at 4:03 pm to provider Priscella Mann, RN, who verbally acknowledged these results. Electronically Signed   By: Signa Kell M.D.   On: 06/20/2022 16:05   Result Date: 06/20/2022 CLINICAL DATA:  Hypoxia. EXAM: PORTABLE CHEST 1 VIEW COMPARISON:  06/18/2018 for FINDINGS: ETT tip is stable above the carina. There is an enteric tube with tip coursing below the field of view. Left IJ catheter tip is in the distal SVC. There is a new moderate left-sided pneumothorax overlying the apex and lateral basal left lung. No signs of pleural effusion or edema. Persistent opacities within the right middle lobe. IMPRESSION: 1. New moderate left-sided pneumothorax. 2. Stable support apparatus. 3. Persistent right middle lobe opacities. Electronically Signed: By: Signa Kell M.D. On: 06/20/2022 15:41   CT HEAD WO CONTRAST ( )  Result Date: 06/20/2022 CLINICAL DATA:  Neuro deficit with acute stroke suspected EXAM: CT HEAD WITHOUT CONTRAST TECHNIQUE: Contiguous axial images were obtained from the base of the skull through the vertex without intravenous contrast. RADIATION DOSE REDUCTION: This exam was performed according to the departmental dose-optimization program which includes automated exposure control, adjustment of the mA and/or kV according to patient size and/or use of iterative reconstruction technique. COMPARISON:  Yesterday FINDINGS: Brain: Large ICH centered at the right basal ganglia and adjacent white matter, with tracking towards the right brainstem is unchanged in shape and size, measuring up to 5 cm anterior to posterior and 4 cm craniocaudal. Regional edema is unchanged. Extensive cortical infarct in the right posterior  frontal and parietal lobe. Smaller but more apparent left parietal cortex infarcts since brain MRI yesterday. Midline shift is 5 mm. Vascular: No hyperdense vessel or unexpected calcification. Skull: Normal. Negative for fracture or focal lesion. Sinuses/Orbits: No acute finding. IMPRESSION: 1. Multiple infarcts by brain MRI yesterday, suspect progressive cortex infarction in the left parietal lobe. 2. Unchanged appearance of right ICH with adjacent edema. Midline shift is 5 mm. Electronically Signed   By: Tiburcio Pea M.D.   On: 06/20/2022 06:09   CT HEAD WO CONTRAST ( )  Result Date: 06/19/2022 CLINICAL DATA:  Follow-up ICH EXAM: CT HEAD WITHOUT CONTRAST TECHNIQUE: Contiguous axial images were obtained from the base of the skull through the vertex without intravenous contrast. RADIATION DOSE REDUCTION: This exam was performed according to the departmental dose-optimization program which includes automated exposure control, adjustment of the mA and/or kV according to patient size and/or use of iterative reconstruction technique. COMPARISON:  MRI from earlier today. FINDINGS: Brain: Large acute hemorrhage centered at the right basal ganglia and adjacent white matter without progression in size or shape when compared to prior. Cytotoxic edema most apparent in the right frontal parietal cortex, infarcts being under appreciated when compared to preceding brain MRI. Diffuse effacement of subarachnoid spaces, leftward midline shift of 5 mm. Unchanged mild dilatation of the left lateral ventricle. Vascular: No hyperdense vessel or unexpected calcification. Skull: Normal. Negative for fracture or focal lesion. Sinuses/Orbits: No acute finding. IMPRESSION: Right ICH and extensive cortical infarction that is stable from brain MRI earlier today. No new abnormality. Leftward midline shift of 5 mm. Electronically Signed   By: Tiburcio Pea M.D.   On: 06/19/2022 11:13   MR BRAIN W WO CONTRAST  Result Date:  06/19/2022 CLINICAL DATA:  Hemorrhagic stroke workup EXAM: MRI HEAD WITHOUT AND WITH CONTRAST TECHNIQUE: Multiplanar, multiecho pulse sequences of the brain and surrounding structures were obtained without and with intravenous contrast. CONTRAST:  6mL GADAVIST GADOBUTROL 1 MMOL/ML IV SOLN COMPARISON:  Head CT and CTA from 2 days prior FINDINGS: Brain: Multiple areas of restricted diffusion most confluent in the right parietal and posterior temporal cortex. Restricted diffusion is seen surrounding the large and known right basal ganglia hemorrhage which measures up to 5.3 cm anterior to posterior with cleft of fluid surrounding the hematoma. Infarcts in other arterial distributions are also present, including along the left cerebral convexity (especially parietooccipital)and at the right brainstem. Midline shift measures 5 mm. There is periventricular FLAIR hyperintensity suggesting chronic small vessel disease. There is asymmetric mild dilatation of the left temporal horn. Vascular: Filling defect in the right sigmoid sinus and upper internal jugular vein. Skull and upper cervical spine: Normal marrow signal Sinuses/Orbits: Mild mucosal thickening in the mastoid air cells. Retention cysts in the right maxillary sinus. Diffuse mucosal thickening in the paranasal sinuses. Negative orbits. IMPRESSION: 1. 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 The most confluent area is in the right MCA territory with infarct likely underlying the right basal ganglia hematoma. 2. 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 The pattern of infarcts and hemorrhage do not correlate with the clot location however. 3. Mild dilatation of the temporal horn of the left lateral ventricle. 5 mm of midline shift. Electronically Signed   By: Tiburcio Pea M.D.   On: 06/19/2022 06:59   DG Abd 1 View  Result Date: 06/18/2022 CLINICAL DATA:   Check gastric catheter placement EXAM: ABDOMEN - 1 VIEW COMPARISON:  Film from earlier in the same day. FINDINGS: Gastric catheter has been advanced and now lies completely within the stomach. Scattered large and small bowel gas is noted. IMPRESSION: Gastric catheter within the stomach as described. Electronically Signed   By: Alcide Clever M.D.   On: 06/18/2022 23:47   DG Abd 1 View  Result Date: 06/18/2022 CLINICAL DATA:  Feeding tube placement. EXAM: ABDOMEN - 1 VIEW COMPARISON:  Jun 18, 2022 (6:44 p.m.) FINDINGS: A nasogastric tube is seen with its distal tip overlying the expected region of the body of the stomach. The distal side hole sits at the level of the gastroesophageal junction. The bowel gas pattern is normal. No radio-opaque calculi or other significant radiographic abnormality are seen. IMPRESSION: Nasogastric tube positioning, as described above. Further advancement of the NG tube by approximately 7 cm is recommended to decrease the risk of aspiration. Electronically Signed   By: Aram Candela M.D.   On: 06/18/2022 22:23   DG Abd Portable 1V  Result Date: 06/18/2022 CLINICAL DATA:  Check gastric catheter placement EXAM: PORTABLE ABDOMEN - 1 VIEW COMPARISON:  None Available. FINDINGS: Scattered large and small bowel gas is noted. Gastric catheter is noted with the tip in the stomach. Proximal side port lies in the distal esophagus. This should be advanced several cm deeper into the stomach. IMPRESSION: Gastric catheter as described. This should be advanced deeper into the stomach. Electronically Signed   By: Alcide Clever M.D.   On: 06/18/2022 21:24   DG CHEST PORT 1 VIEW  Result Date: 06/18/2022 CLINICAL DATA:  Evaluate central line placement EXAM: PORTABLE CHEST 1 VIEW COMPARISON:  Jun 18, 2022 FINDINGS: The side port of the NG  tube is near the GE junction. The distal tip is in the stomach. The ETT is in good position. A left central line is been placed in the interval terminating in  the central SVC. No pneumothorax. The left lung is clear. Right middle lobe infiltrate remains. No other interval changes. IMPRESSION: 1. Support apparatus as above. The side port of the NG tube is near the GE junction. Recommend advancing the tube. 2. The left lung is clear. Right middle lobe infiltrate remains. 3. No other interval changes. Electronically Signed   By: Gerome Sam III M.D.   On: 06/18/2022 17:20   Overnight EEG with video  Result Date: 06/18/2022 Charlsie Quest, MD     06/19/2022  9:37 AM Patient Name: ESTERLENE SIMI MRN: [id number] Epilepsy Attending: Charlsie Quest Referring Physician/Provider: Erick Blinks, MD Duration: 06/17/2022 1215 to 06/18/2022 1215 Patient history: 47 y.o. female with hx of cocaine use p/w unresponsive and large R IPH with brain compression and 5mm midline shift. EEG to evaluate for seizure Level of alertness: Awake, asleep AEDs during EEG study: None Technical aspects: This EEG study was done with scalp electrodes positioned according to the 10-20 International system of electrode placement. Electrical activity was reviewed with band pass filter of 1-70Hz , sensitivity of 7 uV/mm, display speed of 5mm/sec with a 60Hz  notched filter applied as appropriate. EEG data were recorded continuously and digitally stored.  Video monitoring was available and reviewed as appropriate. Description: The posterior dominant rhythm consists of 7.5 Hz activity of moderate voltage (25-35 uV) seen predominantly in posterior head regions, symmetric and reactive to eye opening and eye closing. Sleep was characterized by vertex waves, sleep spindles (12 to 14 Hz), maximal frontocentral region. EEG showed continuous 3 to 6 Hz theta-delta slowing in right hemisphere, maximal right temporal region. Intermittent generalized 3-6hz  theta-delta slowing was also noted. Hyperventilation and photic stimulation were not performed.   ABNORMALITY - Continuous slow, right hemisphere,  maximal right temporal region - Intermittent slow, generalized IMPRESSION: This study is suggestive of cortical dysfunction arising from right hemisphere, maximal right temporal region likely secondary to underlying structural abnormality. Additionally there is mild to moderate diffuse encephalopathy. No seizures or epileptiform discharges were seen throughout the recording. Charlsie Quest   DG Chest Port 1 View  Result Date: 06/18/2022 CLINICAL DATA:  Pulmonary infiltrates EXAM: PORTABLE CHEST 1 VIEW COMPARISON:  06/17/2022 FINDINGS: Endotracheal tube is in stable position. NG tube tip in the proximal stomach with the side port near the GE junction. The NG tube appears to be buckled/looped within the neck near the thoracic inlet. Heart mediastinal contours within normal limits. Right middle lobe airspace opacity again noted, stable. No confluent opacity on the left. No effusions or acute bony abnormality. IMPRESSION: Continued right middle lobe infiltrate, unchanged. NG tube appears to be buckled slashed looped in the upper esophagus near the thoracic inlet. Tip is in the proximal stomach. Electronically Signed   By: Charlett Nose M.D.   On: 06/18/2022 07:11   DG Abd 1 View  Result Date: 06/17/2022 CLINICAL DATA:  Nasogastric tube placement. EXAM: ABDOMEN - 1 VIEW COMPARISON:  Radiograph earlier today. FINDINGS: Tip of the enteric tube is below the diaphragm in the stomach, the side port is just at the gastroesophageal junction. Normal upper abdominal bowel gas pattern. IMPRESSION: Tip of the enteric tube below the diaphragm in the stomach, side-port just at the gastroesophageal junction. Electronically Signed   By: Narda Rutherford M.D.   On: 06/17/2022  15:43   ECHOCARDIOGRAM COMPLETE  Result Date: 06/17/2022    ECHOCARDIOGRAM REPORT   Patient Name:   LANIA WENDLANDT Date of Exam: 06/17/2022 Medical Rec #:  [id number]          Height:       65.0 in Accession #:    [id number]         Weight:        140.0 lb Date of Birth:  October 07, 1975          BSA:          1.700 m Patient Age:    46 years           BP:           122/111 mmHg Patient Gender: F                  HR:           102 bpm. Exam Location:  Inpatient Procedure: 2D Echo, Cardiac Doppler and Color Doppler Indications:    Stroke  History:        Patient has no prior history of Echocardiogram examinations.                 ICH, sepsis; Risk Factors:Current Smoker and Substance abuse.  Sonographer:    Wallie Char Referring Phys: [id number] [facility]  Sonographer Comments: Technically challenging study due to limited acoustic windows and echo performed with patient supine and on artificial respirator. IMPRESSIONS  1. Left ventricular ejection fraction, by estimation, is 50 to 55%. The left ventricle has low normal function. The left ventricle has no regional wall motion abnormalities. There is moderate left ventricular hypertrophy. Left ventricular diastolic parameters are consistent with Grade I diastolic dysfunction (impaired relaxation).  2. Right ventricular systolic function is normal. The right ventricular size is normal. There is mildly elevated pulmonary artery systolic pressure.  3. The mitral valve is normal in structure. Trivial mitral valve regurgitation. No evidence of mitral stenosis.  4. The aortic valve is tricuspid. Aortic valve regurgitation is not visualized. No aortic stenosis is present. FINDINGS  Left Ventricle: Left ventricular ejection fraction, by estimation, is 50 to 55%. The left ventricle has low normal function. The left ventricle has no regional wall motion abnormalities. The left ventricular internal cavity size was normal in size. There is moderate left ventricular hypertrophy. Left ventricular diastolic parameters are consistent with Grade I diastolic dysfunction (impaired relaxation). Right Ventricle: The right ventricular size is normal. No increase in right ventricular wall thickness. Right ventricular systolic  function is normal. There is mildly elevated pulmonary artery systolic pressure. The tricuspid regurgitant velocity is 2.74  m/s, and with an assumed right atrial pressure of 8 mmHg, the estimated right ventricular systolic pressure is 38.0 mmHg. Left Atrium: Left atrial size was normal in size. Right Atrium: Right atrial size was normal in size. Pericardium: There is no evidence of pericardial effusion. Mitral Valve: The mitral valve is normal in structure. Trivial mitral valve regurgitation. No evidence of mitral valve stenosis. MV peak gradient, 5.3 mmHg. The mean mitral valve gradient is 2.0 mmHg. Tricuspid Valve: The tricuspid valve is normal in structure. Tricuspid valve regurgitation is trivial. Aortic Valve: The aortic valve is tricuspid. Aortic valve regurgitation is not visualized. No aortic stenosis is present. Aortic valve mean gradient measures 4.0 mmHg. Aortic valve peak gradient measures 6.2 mmHg. Aortic valve area, by VTI measures 2.39 cm. Pulmonic Valve: The pulmonic valve was normal in structure. Pulmonic valve regurgitation  is not visualized. Aorta: The aortic root and ascending aorta are structurally normal, with no evidence of dilitation. IAS/Shunts: The interatrial septum was not well visualized.  LEFT VENTRICLE PLAX 2D LVIDd:         4.50 cm     Diastology LVIDs:         3.30 cm     LV e' medial:    6.63 cm/s LV PW:         1.10 cm     LV E/e' medial:  11.0 LV IVS:        1.00 cm     LV e' lateral:   5.86 cm/s LVOT diam:     1.90 cm     LV E/e' lateral: 12.4 LV SV:         40 LV SV Index:   23 LVOT Area:     2.84 cm  LV Volumes (MOD) LV vol d, MOD A2C: 68.4 ml LV vol d, MOD A4C: 77.5 ml LV vol s, MOD A2C: 33.9 ml LV vol s, MOD A4C: 37.8 ml LV SV MOD A2C:     34.5 ml LV SV MOD A4C:     77.5 ml LV SV MOD BP:      39.2 ml RIGHT VENTRICLE             IVC RV S prime:     13.90 cm/s  IVC diam: 1.50 cm TAPSE (M-mode): 1.6 cm LEFT ATRIUM             Index        RIGHT ATRIUM           Index LA diam:         3.00 cm 1.76 cm/m   RA Area:     10.40 cm LA Vol (A2C):   23.2 ml 13.65 ml/m  RA Volume:   20.40 ml  12.00 ml/m LA Vol (A4C):   19.2 ml 11.29 ml/m LA Biplane Vol: 21.2 ml 12.47 ml/m  AORTIC VALVE AV Area (Vmax):    2.85 cm AV Area (Vmean):   2.41 cm AV Area (VTI):     2.39 cm AV Vmax:           124.00 cm/s AV Vmean:          93.450 cm/s AV VTI:            0.166 m AV Peak Grad:      6.2 mmHg AV Mean Grad:      4.0 mmHg LVOT Vmax:         124.50 cm/s LVOT Vmean:        79.500 cm/s LVOT VTI:          0.140 m LVOT/AV VTI ratio: 0.84  AORTA Ao Root diam: 3.30 cm Ao Asc diam:  2.80 cm MITRAL VALVE               TRICUSPID VALVE MV Area (PHT): 4.06 cm    TR Peak grad:   30.0 mmHg MV Area VTI:   1.72 cm    TR Vmax:        274.00 cm/s MV Peak grad:  5.3 mmHg MV Mean grad:  2.0 mmHg    SHUNTS MV Vmax:       1.15 m/s    Systemic VTI:  0.14 m MV Vmean:      68.2 cm/s   Systemic Diam: 1.90 cm MV Decel Time: 187 msec MV E velocity: 72.90 cm/s MV  A velocity: 89.60 cm/s MV E/A ratio:  0.81 Epifanio Lesches MD Electronically signed by Epifanio Lesches MD Signature Date/Time: 06/17/2022/10:47:05 AM    Final    CT VENOGRAM HEAD  Result Date: 06/17/2022 CLINICAL DATA:  Intracranial hemorrhage. EXAM: CT VENOGRAM HEAD TECHNIQUE: Venographic phase images of the brain were obtained following the administration of intravenous contrast. Multiplanar reformats and maximum intensity projections were generated. RADIATION DOSE REDUCTION: This exam was performed according to the departmental dose-optimization program which includes automated exposure control, adjustment of the mA and/or kV according to patient size and/or use of iterative reconstruction technique. CONTRAST:  75mL OMNIPAQUE IOHEXOL 350 MG/ML SOLN COMPARISON:  None Available. FINDINGS: Filling defect of the right transverse sinus and right transverse sigmoid junction which are somewhat large and tubular for arachnoid granulations, although an arachnoid  granulation at the left transverse sigmoid junction appears nearly similar. The location would also not explain the acute hemorrhage. No visible cortical or central venous thrombosis. IMPRESSION: No cortical or central venous thrombosis to explain the ICH. There are filling defects at the right transverse and transverse sigmoid dural sinuses which are prominent for arachnoid granulations but not definitive for thrombus. Attention at follow-up imaging. Electronically Signed   By: Tiburcio Pea M.D.   On: 06/17/2022 05:30   CT ANGIO HEAD NECK W WO CM  Result Date: 06/17/2022 CLINICAL DATA:  Found unresponsive. Intracranial hemorrhage. History of cocaine use EXAM: CT ANGIOGRAPHY HEAD AND NECK WITH AND WITHOUT CONTRAST TECHNIQUE: Multidetector CT imaging of the head and neck was performed using the standard protocol during bolus administration of intravenous contrast. Multiplanar CT image reconstructions and MIPs were obtained to evaluate the vascular anatomy. Carotid stenosis measurements (when applicable) are obtained utilizing NASCET criteria, using the distal internal carotid diameter as the denominator. RADIATION DOSE REDUCTION: This exam was performed according to the departmental dose-optimization program which includes automated exposure control, adjustment of the mA and/or kV according to patient size and/or use of iterative reconstruction technique. CONTRAST:  75mL OMNIPAQUE IOHEXOL 350 MG/ML SOLN COMPARISON:  Head CT from earlier today FINDINGS: CTA NECK FINDINGS Aortic arch: Unremarkable Right carotid system: Mixed density plaque at the bifurcation with some low-density plaque bulging into the ICA bulb. No ulceration or flow limiting stenosis Left carotid system: Mainly low-density plaque at the bifurcation without stenosis or ulceration. Vertebral arteries: No proximal subclavian stenosis. The vertebral arteries are somewhat tortuous but smoothly contoured and diffusely patent Skeleton: No acute or  aggressive finding. Other neck: No acute finding Upper chest: Mild opacification of dependent lungs which could be atelectasis. Centrilobular emphysema. Review of the MIP images confirms the above findings CTA HEAD FINDINGS Anterior circulation: No aneurysm or spot sign seen underlying the ICH. There is a aneurysm projecting leftward from the left carotid terminus which measures 4 mm. No adjacent hemorrhage. No major branch occlusion. There is undulation of the bilateral intracranial branches which is likely atheromatous given findings in the neck. No segmental beading. Some less intense flow and right MCA branches is likely related to mass effect by the ICH. Posterior circulation: The vertebral and basilar arteries arediffusely patent. Undulation of the left more than right PCA with up to moderate narrowing on the left, likely atheromatous given the findings in the neck. Venous sinuses: Reference dedicated CT venogram Anatomic variants: No acute finding Review of the MIP images confirms the above findings IMPRESSION: 1. No vascular lesion or spot sign seen at the acute ICH. 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 3. Premature atherosclerosis  affecting cervical and intracranial branches. Electronically Signed   By: Tiburcio Pea M.D.   On: 06/17/2022 05:18   CT Head Wo Contrast  Result Date: 06/17/2022 CLINICAL DATA:  Altered mental status EXAM: CT HEAD WITHOUT CONTRAST TECHNIQUE: Contiguous axial images were obtained from the base of the skull through the vertex without intravenous contrast. RADIATION DOSE REDUCTION: This exam was performed according to the departmental dose-optimization program which includes automated exposure control, adjustment of the mA and/or kV according to patient size and/or use of iterative reconstruction technique. COMPARISON:  None Available. FINDINGS: Brain: There is a large area of parenchymal hemorrhage identified in the right cerebral hemisphere centered primarily within the  basal ganglia and extending superiorly into the centrum semi ovale. This measures approximately 4.7 x 4.0 cm in greatest AP and transverse dimensions respectively. It extends for approximately 4.7 cm in craniocaudad dimension. Significant mass-effect upon the right lateral ventricle is noted. Mild midline shift of approximately 5 mm is noted. Some surrounding edema is seen as well. No other hemorrhage is noted. Relative loss of the sulcal markings is noted likely related to some increased intracranial pressure. Vascular: No hyperdense vessel or unexpected calcification. Skull: Normal. Negative for fracture or focal lesion. Sinuses/Orbits: No acute finding. Other: None. IMPRESSION: Right-sided intraparenchymal hemorrhage as described with associated edema and mild midline shift of 5 mm from right to left. Critical Value/emergent results were called by telephone at the time of interpretation on 06/17/2022 at 2:27 am to Dr. Donna Bernard , who verbally acknowledged these results. Electronically Signed   By: Alcide Clever M.D.   On: 06/17/2022 02:29   DG Abdomen 1 View  Result Date: 06/17/2022 CLINICAL DATA:  Check gastric catheter placement EXAM: ABDOMEN - 1 VIEW COMPARISON:  None Available. FINDINGS: Gastric catheter is noted extending into the stomach. Proximal side port lies at the gastroesophageal junction. This should be advanced further into the stomach. No free air is seen. No bony abnormality is noted. IMPRESSION: Gastric catheter as described. Electronically Signed   By: Alcide Clever M.D.   On: 06/17/2022 02:16   DG Chest Port 1 View  Result Date: 06/17/2022 CLINICAL DATA:  Check endotracheal tube placement EXAM: PORTABLE CHEST 1 VIEW COMPARISON:  10/21/2020 FINDINGS: Cardiac shadow is within normal limits. Endotracheal tube is noted 1.5 cm above the carina. Gastric catheter is noted extending into the stomach. Patchy increased density is noted in the right middle lobe beneath the minor fissure consistent  with acute infiltrate. No sizable effusion is noted. No bony abnormality is seen. IMPRESSION: Right middle lobe infiltrate. Tubes and lines as described. Electronically Signed   By: Alcide Clever M.D.   On: 06/17/2022 02:16     PHYSICAL EXAM  Temp:  [96.6 F (35.9 C)-100.2 F (37.9 C)] 97.5 F (36.4 C) (05/15 1152) Pulse Rate:  [89-133] 93 (05/15 1100) Resp:  [19-43] 29 (05/15 1100) BP: (102-165)/(70-101) 153/81 (05/15 1100) SpO2:  [93 %-100 %] 96 % (05/15 1117) FiO2 (%):  [40 %] 40 % (05/15 0800) Weight:  [60.7 kg] 60.7 kg (05/15 0137)  General - Well nourished, well developed, intubated on sedation.  Ophthalmologic - fundi not visualized due to noncooperation.  Cardiovascular - Regular rhythm but mild tachycardia.  Neuro - intubated on low dose fentanyl, eyes open on voice, still not following commands. With eye opening, right pupil 1.5 mm, left pupil 2.5 mm, sluggish to light.  Corneal reflex present, cough and gag present. Breathing over the vent.  Facial symmetry not able  to test due to ET tube.  Tongue protrusion not cooperative. On pain stimulation, no movement in LUE and LLE but withdraw RUE and RLE, localize to pain on RUE. Sensation, coordination and gait not tested.   ASSESSMENT/PLAN Ms. KAMREY STEIMLE is a 47 y.o. female with history of cocaine abuse admitted for unresponsiveness.  Intubated in ER for airway protection.  No tPA given due to ICH.    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 Vasospasm from ICH vs. 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  2D Echo EF 50 to 55% LDL 65 HgbA1c 5.9 UDS positive for cocaine Heparin subcu for VTE prophylaxis No antithrombotic prior to admission, now on No antithrombotic due to ICH. Therapy recommendations: Pending Disposition: Pending, prognosis poor, palliative care on board. Daughter is coming soon for decision making  Cerebral edema CT right BG and CR large ICH, 5 mm midline shift CT repeat 5/12 stable ICH with leftward midline shift of 5 mm CT repeat 5/13 unchanged appearance of right ICH with adjacent edema. Midline shift is 5 mm. Na 151->155->157->159-> 155->158->150->159->162->161 off 3% saline -> FW now Na monitoring LTM EEG cortical dysfunction arising from right hemisphere with diffuse encephalopathy, no seizures  Respiratory failure Aspiration pneumonia Leukocytosis Pneumothorax  Intubated on vent Still on fentanyl, low dose CCM on board CXR right middle lobe infiltrate On azithromycin and Rocephin -> unasyn and zyvox Tmax 100.6->100.3->afebrile WBC 22.2->20.1->15.8->14.0->13.5 S/p chest tube  Hypertension BP unstable Off cleviprex -> resumed cleviprex  Labetalol and hydralazine IV PRN BP goal < 160 Long term BP goal normotensive  AKI Creatinine 2.15-1.32-1.03-0.73-0.87-0.97 On tube feeding and IVF  UTI UA WBC 21-50 On unasyn and zyvox Urine culture + STREPTOCOCCUS AGALACTIAE   Cocaine abuse History of cocaine abuse UDS positive for cocaine Cessation education will be provided  Dysphagia On tube feeding  Other Stroke Risk Factors   Other Active Problems Cerebral aneurysm, CTA head and neck  showed 4 mm left terminal ICA aneurysm, asymptomatic, outpatient follow-up  Hospital day # 5  Marvel Plan, MD PhD Stroke Neurology 06/22/2022 11:57 AM  This patient is critically ill due to large ICH, stroke, dural venous sinus thrombosis, cerebral edema, cocaine abuse, AKI, pneumonia, UTI and at significant risk of neurological worsening, death form reherniation, brain death, seizure, sepsis. This patient's care requires constant monitoring of vital signs, hemodynamics, respiratory and cardiac monitoring, review of multiple databases, neurological assessment, discussion with family, other specialists and medical decision making of high complexity. I spent 35 minutes of neurocritical care time in the care of this patient. I discussed with Dr. Merrily Pew.    To contact Stroke Continuity provider, please refer to [email or web address]. After hours, contact General Neurology

## 2022-06-22 NOTE — Progress Notes (Signed)
Daily Progress Note   Patient Name: Rebecca [name]       Date: 06/22/2022 DOB: September 20, 1975  Age: 47 y.o. MRN#: [id number] Attending Physician: Stroke, Md, MD Primary Care Physician: Patient, No Pcp Per Admit Date: 06/17/2022  Reason for Consultation/Follow-up: Establishing goals of care  Subjective: Patient on ventilator. Appears comfortable. No family at bedside.   Length of Stay: 5  Current Medications: Scheduled Meds:   amLODipine  10 mg Per Tube Daily   Chlorhexidine Gluconate Cloth  6 each Topical Daily   docusate  100 mg Per Tube BID   feeding supplement (PROSource TF20)  60 mL Per Tube Daily   folic acid  1 mg Intravenous Daily   heparin injection (subcutaneous)  5,000 Units Subcutaneous Q8H   ipratropium-albuterol  3 mL Nebulization Q4H   lisinopril  40 mg Per Tube Daily   multivitamin with minerals  1 tablet Per Tube Daily   mupirocin ointment  1 Application Nasal BID   mouth rinse  15 mL Mouth Rinse Q2H   pantoprazole (PROTONIX) IV  40 mg Intravenous QHS   polyethylene glycol  17 g Per Tube Daily   senna-docusate  1 tablet Per Tube BID   sodium chloride flush  10 mL Intrapleural Q8H   thiamine (VITAMIN B1) injection  100 mg Intravenous Daily    Continuous Infusions:  sodium chloride 10 mL/hr at 06/22/22 1100   ampicillin-sulbactam (UNASYN) IV Stopped (06/22/22 0935)   clevidipine Stopped (06/21/22 1120)   dexmedetomidine (PRECEDEX) IV infusion Stopped (06/21/22 1250)   feeding supplement (VITAL 1.5 CAL) 1,000 mL (06/22/22 1218)   fentaNYL infusion INTRAVENOUS 100 mcg/hr (06/22/22 1100)   linezolid (ZYVOX) IV Stopped (06/22/22 1036)    PRN Meds: sodium chloride, acetaminophen **OR** acetaminophen (TYLENOL) oral liquid 160 mg/5 mL **OR** acetaminophen, fentaNYL,  hydrALAZINE, midazolam, mouth rinse  Physical Exam Vitals reviewed.  Constitutional:      Appearance: She is ill-appearing.  Pulmonary:     Comments: Ventilator support            Vital Signs: BP (!) 153/81   Pulse 93   Temp (!) 97.5 F (36.4 C)   Resp (!) 29   Ht 5\' 5"  (1.651 m)   Wt 60.7 kg   SpO2 96%   BMI 22.27 kg/m  SpO2: SpO2: 96 %  O2 Device: O2 Device: Ventilator O2 Flow Rate:    Intake/output summary:  Intake/Output Summary (Last 24 hours) at 06/22/2022 1252 Last data filed at 06/22/2022 1200 Gross per 24 hour  Intake 2343.66 ml  Output 970 ml  Net 1373.66 ml   LBM: Last BM Date : 06/22/22 Baseline Weight: Weight: 60.2 kg (bed scale) Most recent weight: Weight: 60.7 kg       Palliative Assessment/Data: 10%      Patient Active Problem List   Diagnosis Date Noted   Acute respiratory failure with hypoxia (HCC) 06/21/2022   Chest tube in place 06/20/2022   Hemodialysis-associated hypotension 06/18/2022   ICH (intracerebral hemorrhage) (HCC) 06/17/2022   Cocaine abuse (HCC) 05/12/2017   Substance induced mood disorder (HCC) 05/12/2017   Sepsis (HCC) 03/23/2016    Palliative Care Assessment & Plan   Patient Profile: 47 yo female with hx of polysubstance abuse presented brought to [facility] ED with AMS. Identified to have significant R sided ICH. Is receiving care in the ICU as is intubation. Neurologically has a poor prognosis therefore the Palliative care team has been asked to get involved for further conversations.   Assessment: Spoke with daughter [name]. She was able to speak with her middle sister who does not want to participate in discussion of care goals. She is trying to find her youngest sister. [name] is prepared to make a decision regarding her mother's care goals. She has been talking to the ICU staff. She understands her mother had a small improvement today but overall prognosis is poor. [name] has a significant other that she can lean on  while making these difficult decisions. She has a flight scheduled for tomorrow afternoon. I offered to set up a meeting but she would like to wait until she gets here and gets settled (she is also waiting to see if she can get in touch with her sister).  Recommendations/Plan: Full code PMT support  Goals of Care and Additional Recommendations: Limitations on Scope of Treatment: Full Scope Treatment  Code Status:    Code Status Orders  (From admission, onward)           Start     Ordered   06/17/22 0630  Full code  Continuous       Question:  By:  Answer:  Default: patient does not have capacity for decision making, no surrogate or prior directive available   06/17/22 0631           Code Status History     Date Active Date Inactive Code Status Order ID Comments User Context   03/23/2016 1014 03/24/2016 1746 Full Code [id number]  Arnaldo Natal Inpatient       Prognosis:  Unable to determine  Discharge Planning: To Be Determined  Care plan was discussed with bedside RN, Dr. Roda Shutters, and attending team.  Time spent: 35 min  Thank you for allowing the Palliative Medicine Team to assist in the care of this patient.    Sherryll Burger, NP  Please contact Palliative Medicine Team phone at [phone] for questions and concerns.

## 2022-06-23 ENCOUNTER — Inpatient Hospital Stay (HOSPITAL_COMMUNITY): Payer: Medicaid Other

## 2022-06-23 DIAGNOSIS — Z9689 Presence of other specified functional implants: Secondary | ICD-10-CM | POA: Diagnosis not present

## 2022-06-23 DIAGNOSIS — I610 Nontraumatic intracerebral hemorrhage in hemisphere, subcortical: Secondary | ICD-10-CM | POA: Diagnosis not present

## 2022-06-23 DIAGNOSIS — J9601 Acute respiratory failure with hypoxia: Secondary | ICD-10-CM | POA: Diagnosis not present

## 2022-06-23 LAB — BASIC METABOLIC PANEL
Anion gap: 9 (ref 5–15)
BUN: 26 mg/dL — ABNORMAL HIGH (ref 6–20)
CO2: 26 mmol/L (ref 22–32)
Calcium: 8.9 mg/dL (ref 8.9–10.3)
Chloride: 120 mmol/L — ABNORMAL HIGH (ref 98–111)
Creatinine, Ser: 0.84 mg/dL (ref 0.44–1.00)
GFR, Estimated: >60 mL/min (ref >60)
Glucose, Bld: 110 mg/dL — ABNORMAL HIGH (ref 70–99)
Potassium: 3.7 mmol/L (ref 3.5–5.1)
Sodium: 155 mmol/L — ABNORMAL HIGH (ref 135–145)

## 2022-06-23 LAB — GLUCOSE, CAPILLARY
Glucose-Capillary: 103 mg/dL — ABNORMAL HIGH (ref 70–99)
Glucose-Capillary: 94 mg/dL (ref 70–99)
Glucose-Capillary: 121 mg/dL — ABNORMAL HIGH (ref 70–99)
Glucose-Capillary: 125 mg/dL — ABNORMAL HIGH (ref 70–99)
Glucose-Capillary: 118 mg/dL — ABNORMAL HIGH (ref 70–99)
Glucose-Capillary: 121 mg/dL — ABNORMAL HIGH (ref 70–99)

## 2022-06-23 LAB — CBC
HCT: 29.7 % — ABNORMAL LOW (ref 36.0–46.0)
Hemoglobin: 8.6 g/dL — ABNORMAL LOW (ref 12.0–15.0)
MCH: 21.8 pg — ABNORMAL LOW (ref 26.0–34.0)
MCHC: 29 g/dL — ABNORMAL LOW (ref 30.0–36.0)
MCV: 75.4 fL — ABNORMAL LOW (ref 80.0–100.0)
Platelets: 305 10*3/uL (ref 150–400)
RBC: 3.94 MIL/uL (ref 3.87–5.11)
RDW: 20.1 % — ABNORMAL HIGH (ref 11.5–15.5)
WBC: 15 10*3/uL — ABNORMAL HIGH (ref 4.0–10.5)
nRBC: 0.3 % — ABNORMAL HIGH (ref 0.0–0.2)

## 2022-06-23 LAB — CULTURE, BLOOD (ROUTINE X 2)
Culture: NO GROWTH
Special Requests: ADEQUATE

## 2022-06-23 MED ORDER — FUROSEMIDE 10 MG/ML IJ SOLN
40.0000 mg | Freq: Once | INTRAMUSCULAR | Status: AC
  Administered 2022-06-23: 40 mg via INTRAVENOUS
  Filled 2022-06-23: qty 4

## 2022-06-23 MED ORDER — POTASSIUM CHLORIDE CRYS ER 20 MEQ PO TBCR: 40.0000 meq | EXTENDED_RELEASE_TABLET | Freq: Once | ORAL | Status: DC

## 2022-06-23 MED ORDER — POTASSIUM CHLORIDE 20 MEQ PO PACK
40.0000 meq | PACK | Freq: Once | ORAL | Status: AC
  Administered 2022-06-23: 40 meq
  Filled 2022-06-23: qty 2

## 2022-06-23 MED ORDER — THIAMINE MONONITRATE 100 MG PO TABS
100.0000 mg | ORAL_TABLET | Freq: Every day | ORAL | Status: DC
  Administered 2022-06-24 – 2022-07-01 (×8): 100 mg
  Filled 2022-06-23 (×8): qty 1

## 2022-06-23 MED ORDER — FOLIC ACID 1 MG PO TABS
1.0000 mg | ORAL_TABLET | Freq: Every day | ORAL | Status: DC
  Administered 2022-06-24 – 2022-07-01 (×8): 1 mg
  Filled 2022-06-23 (×8): qty 1

## 2022-06-23 NOTE — Progress Notes (Addendum)
Daily Progress Note   Patient Name: Rebecca [name]       Date: 06/23/2022 DOB: Nov 11, 1975  Age: 47 y.o. MRN#: [id number] Attending Physician: Stroke, Md, MD Primary Care Physician: Patient, No Pcp Per Admit Date: 06/17/2022  Reason for Consultation/Follow-up: Establishing goals of care  Subjective: Patient was sleeping. She appeared comfortable.  Length of Stay: 6  Current Medications: Scheduled Meds:   amLODipine  10 mg Per Tube Daily   Chlorhexidine Gluconate Cloth  6 each Topical Daily   docusate  100 mg Per Tube BID   feeding supplement (PROSource TF20)  60 mL Per Tube Daily   [START ON 06/24/2022] folic acid  1 mg Per Tube Daily   heparin injection (subcutaneous)  5,000 Units Subcutaneous Q8H   ipratropium-albuterol  3 mL Nebulization Q4H   lisinopril  40 mg Per Tube Daily   multivitamin with minerals  1 tablet Per Tube Daily   mupirocin ointment  1 Application Nasal BID   mouth rinse  15 mL Mouth Rinse Q2H   pantoprazole (PROTONIX) IV  40 mg Intravenous QHS   polyethylene glycol  17 g Per Tube Daily   senna-docusate  1 tablet Per Tube BID   sodium chloride flush  10 mL Intrapleural Q8H   [START ON 06/24/2022] thiamine  100 mg Per Tube Daily    Continuous Infusions:  sodium chloride 5 mL/hr at 06/23/22 0745   ampicillin-sulbactam (UNASYN) IV 3 g (06/23/22 0923)   feeding supplement (VITAL 1.5 CAL) 1,000 mL (06/23/22 1018)   fentaNYL infusion INTRAVENOUS 150 mcg/hr (06/23/22 0745)   linezolid (ZYVOX) IV 600 mg (06/23/22 1017)    PRN Meds: sodium chloride, acetaminophen **OR** acetaminophen (TYLENOL) oral liquid 160 mg/5 mL **OR** acetaminophen, fentaNYL, hydrALAZINE, midazolam, mouth rinse  Physical Exam Constitutional:      Appearance: She is ill-appearing.   Pulmonary:     Comments: intubated            Vital Signs: BP (!) 154/92   Pulse 94   Temp 100.3 F (37.9 C) (Oral)   Resp (!) 32   Ht 5\' 5"  (1.651 m)   Wt 61.6 kg   SpO2 98%   BMI 22.60 kg/m  SpO2: SpO2: 98 % O2 Device: O2 Device: Ventilator O2 Flow Rate:    Intake/output summary:  Intake/Output Summary (  Last 24 hours) at 06/23/2022 1251 Last data filed at 06/23/2022 1018 Gross per 24 hour  Intake 2046.3 ml  Output 1945 ml  Net 101.3 ml   LBM: Last BM Date : 06/22/22 Baseline Weight: Weight: 60.2 kg (bed scale) Most recent weight: Weight: 61.6 kg       Palliative Assessment/Data: 10%      Patient Active Problem List   Diagnosis Date Noted   Acute respiratory failure with hypoxia (HCC) 06/21/2022   Chest tube in place 06/20/2022   Hemodialysis-associated hypotension 06/18/2022   ICH (intracerebral hemorrhage) (HCC) 06/17/2022   Cocaine abuse (HCC) 05/12/2017   Substance induced mood disorder (HCC) 05/12/2017   Sepsis (HCC) 03/23/2016    Palliative Care Assessment & Plan   Patient Profile: 47 yo female with hx of polysubstance abuse presented brought to [facility] ED with AMS. Identified to have significant R sided ICH. Is receiving care in the ICU as is intubation. Neurologically has a poor prognosis therefore the Palliative care team has been asked to get involved for further conversations.     Assessment: Patient was sleeping.  Daughter Rebecca [name] called to let the team know her flight has been delayed and she wants to schedule the meeting for tomorrow (06/24/22) at 3 pm. I told her I would coordinate this. She says she wants to be here to support her mother. Rebecca [name] has been calling the other family members and have encouraged them to participate in the decision making process but they have been unreceptive to her calls.   As Rebecca [name] makes decisions I encouraged her to think of what the patient's goals and wishes would be if she were making them  herself.   Recommendations/Plan: Full code PMT support GOC conversation scheduled 3 pm 06/24/22  Goals of Care and Additional Recommendations: Limitations on Scope of Treatment: Full Scope Treatment  Code Status:    Code Status Orders  (From admission, onward)           Start     Ordered   06/17/22 0630  Full code  Continuous       Question:  By:  Answer:  Default: patient does not have capacity for decision making, no surrogate or prior directive available   06/17/22 0631           Code Status History     Date Active Date Inactive Code Status Order ID Comments User Context   03/23/2016 1014 03/24/2016 1746 Full Code [id number]  Arnaldo Natal Inpatient       Prognosis:  Unable to determine  Discharge Planning: To Be Determined  Care plan was discussed with bedside RN   Time spent: 35 min  Thank you for allowing the Palliative Medicine Team to assist in the care of this patient.   Sherryll Burger, NP  Please contact Palliative Medicine Team phone at [phone] for questions and concerns.

## 2022-06-23 NOTE — Progress Notes (Signed)
Nutrition Follow-up  DOCUMENTATION CODES:   Not applicable  INTERVENTION:   Continue tube feeds via OG tube: - Vital 1.5 @ 45 ml/hr (1080 ml/day) - PROSource TF20 60 ml daily  Tube feeding regimen provides 1700 kcal, 93 grams of protein, and 821 ml of H2O.  NUTRITION DIAGNOSIS:   Inadequate oral intake related to inability to eat as evidenced by NPO status.  Ongoing, being addressed via TF  GOAL:   Patient will meet greater than or equal to 90% of their needs  Met via TF  MONITOR:   Vent status, Labs, Weight trends, TF tolerance, I & O's  REASON FOR ASSESSMENT:   Ventilator, Consult Enteral/tube feeding initiation and management  ASSESSMENT:   47 year old female with PMHx of polysubstance abuse admitted to [facility] ED with AMS and intubated for airway protection, found to have large right intraparenchymal hemorrhage with 5 mm midline shift, UDS positive for cocaine. Transferred to [facility] on 06/17/22.  05/13 - s/p L chest tube placement for pneumothorax  Discussed pt with RN and during ICU rounds. Pt with new HFrEF. Pt tolerating tube feeds at goal rate via OG tube. Weight stable. Palliative Medicine team following for GOC discussions.  Admit weight: 60.2 kg Current weight: 61.6 kg  Current TF: Vital 1.5 @ 45 ml/hr, PROSource TF20 60 ml daily  Patient remains intubated on ventilator support MV: 14.4 L/min Temp (24hrs), Avg:97.6 F (36.4 C), Min:96.4 F (35.8 C), Max:100.3 F (37.9 C)  Drips: Fentanyl  Medications reviewed and include: colace, folic acid, MVI with minerals, IV protonix, miralax, senna, thiamine, IV abx  Labs reviewed: sodium 155, BUN 26, WBC 15.0, hemoglobin 8.6 CBG's: 94-110 x 24 hours  UOP: 1425 ml x 24 hours Chest tube: 90 ml x 24 hours I/O's: +7.6 L since admit  Diet Order:   Diet Order             Diet NPO time specified  Diet effective now                   EDUCATION NEEDS:   No education needs have been identified  at this time  Skin:  Skin Assessment: Reviewed RN Assessment  Last BM:  06/22/22  Height:   Ht Readings from Last 1 Encounters:  06/22/22 5\' 5"  (1.651 m)    Weight:   Wt Readings from Last 1 Encounters:  06/23/22 61.6 kg    Ideal Body Weight:  56.8 kg  BMI:  Body mass index is 22.6 kg/m.  Estimated Nutritional Needs:   Kcal:  1600-1800  Protein:  85-95 grams  Fluid:  >/= 1.8 L/day or per MD    Mertie Clause, MS, RD, LDN Inpatient Clinical Dietitian Please see AMiON for contact information.

## 2022-06-23 NOTE — Progress Notes (Signed)
NAME:  Rebecca [name], MRN:  [id number], DOB:  1975-11-05, LOS: 6 ADMISSION DATE:  06/17/2022, CONSULTATION DATE:  06/23/22 REFERRING MD:  EDP, CHIEF COMPLAINT:  unresponsive   History of Present Illness:  47 yo female with hx of polysubstance abuse presented brought to [facility] ED with AMS.  Intubated for airway protection.  CT head showed large Rt ICH with 5 mm midline shift.  UDS positive for cocaine.  Transferred to [facility] for further management.  PCCM consulted to assist with management in ICU.   Hx from chart and medical team.  Pertinent  Medical History  Substance abuse  Significant Hospital Events: Including procedures, antibiotic start and stop dates in addition to other pertinent events   5/10 Admit, neurosurgery consulted 5/11 remains on ventilator with hypertonic saline and EEG in place. CVC placed.  5/12 no acute events overnight, MRI obtained results pending  Interim History / Subjective:  -Vent settings unchanged - chest tube output overnight  Objective   Blood pressure (!) 155/93, pulse 94, temperature 99.4 F (37.4 C), temperature source Oral, resp. rate (!) 23, height 5\' 5"  (1.651 m), weight 61.6 kg, SpO2 98 %.    Vent Mode: PSV;CPAP FiO2 (%):  [40 %] 40 % Set Rate:  [18 bmp] 18 bmp Vt Set:  [480 mL] 480 mL PEEP:  [5 cmH20-8 cmH20] 5 cmH20 Pressure Support:  [8 cmH20] 8 cmH20 Plateau Pressure:  [19 cmH20-23 cmH20] 23 cmH20   Intake/Output Summary (Last 24 hours) at 06/23/2022 1052 Last data filed at 06/23/2022 1018 Gross per 24 hour  Intake 2289.15 ml  Output 2215 ml  Net 74.15 ml   Filed Weights   06/21/22 0411 06/22/22 0137 06/23/22 0500  Weight: 59.5 kg 60.7 kg 61.6 kg   Physical exam General: Acute ill-appearing adult female lying in bed on mechanical ventilation in no acute distress HEENT: ETT, MM pink/moist Neuro: Follows commands. Pupils unequal L>R, L 4mm and sluggish, R 2mm reactive. Cough and gag intact. Withdraws to pain in RUE, RLE,  flicker in LLE.  CV: s1s2 regular rate and rhythm, no murmur, rubs, or gallops. PULM: Coarse breath sounds bilaterally. No increased work of breathing, tolerating ventilator. GI: Moderate distension, but soft. 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  -Primary management per neurology, appreciate assistance -Hypertonic saline off -Trend sodium -AEDs per neuro -Maintain neuroprotective measures  Acute Hypoxemic Respiratory Failure CAP vs aspiration PNA L Pneumothorax  Respiratory status is stable, tolerating ventilation well on minimal settings, chest tube suction turned off. She remains intubated for airway protection for mental status, will remove chest tube with extubation.  P: -Continue Unasyn  -Follow cultures  -VAP bundle in place  -PAD protocol  Hypertension  Hypertension improved, continue to monitor.  -Hydralazine for BP goal<160 -Amlodipine 10mg  daily -Lisinopril 40mg  daily  New HFrEF, not decompensated SVT -continue to monitor -Echocardiogram completed 06/16/2022 with EF 50 to 55%, moderate left ventricular hypertrophy and grade 1 diastolic dysfunction  Hypokalemia K 3.7 today.  -Continue to monitor  Anemia P: -Transfuse per protocol -Hemoglobin goal 7 -Monitor for signs of bleeding  Urinary  tract infection present on admission Urinalysis with many bacteria, moderate leukocytes, but negative nitrates.  Due to poor mental status and current intubation we will to determine if patient is symptomatic. Treated with CTX x 4 days with empiric PNA coverage.    Elevated troponin in setting of cocaine abuse -High-sensitivity troponin peaked at 708 P: Continuous telemetry Optimize electrolytes  AKI from hypovolemia - resolved Baseline creatinine 0.87 from 10/21/20, creatinine on admission 2.03 GFR 30. Now back to baseline, continue to monitor.   At risk malnutrition P: Continue tube feeds  Best Practice:   Diet/type: tubefeeds DVT prophylaxis: SCD GI prophylaxis: PPI Lines: N/A Foley:  Yes, and it is still needed Code Status:  full code Last date of multidisciplinary goals of care discussion: Per primary  Critical care time:

## 2022-06-23 NOTE — Progress Notes (Addendum)
STROKE TEAM PROGRESS NOTE   SUBJECTIVE (INTERVAL HISTORY)  Examined patient at bedside, no family present. She is on of fentanyl for ventilator sedation. She does open eyes to voice with left gaze preference. Blinks to visual threat on right. She is unable to track in either direction, maintains steady gaze. Left pupil remains slightly larger than right, but improved from prior. Bilateral pupils are reactive to light. Able to minimally wiggle toes on RLE to command. Able to subtly move fingers in RUE to command in attempt to grasp my hand. Withdraws to noxious stimuli in RLE and RUE. L side remains flaccid. Daughter to fly in today, palliative care helping to schedule family meeting. Low grade fever of 100.22F overnight, on unazyn and zyvox. Sodium down to 155.    OBJECTIVE Temp:  [96.4 F (35.8 C)-100.3 F (37.9 C)] 99.4 F (37.4 C) (05/16 0714) Pulse Rate:  [78-116] 94 (05/16 0737) Cardiac Rhythm: Normal sinus rhythm;Sinus tachycardia (05/16 0730) Resp:  [20-33] 23 (05/16 0737) BP: (131-164)/(75-99) 155/93 (05/16 0737) SpO2:  [95 %-100 %] 98 % (05/16 0737) FiO2 (%):  [40 %] 40 % (05/16 0848) Weight:  [61.6 kg] 61.6 kg (05/16 0500)  Recent Labs  Lab 06/22/22 1541 06/22/22 1909 06/22/22 2338 06/23/22 0336 06/23/22 0713  GLUCAP 96 110* 94 103* 94   Recent Labs  Lab 06/17/22 0759 06/17/22 0922 06/18/22 0242 06/18/22 0616 06/18/22 0915 06/18/22 1441 06/20/22 0403 06/20/22 1520 06/21/22 0407 06/21/22 0816 06/21/22 2000 06/22/22 0329 06/22/22 0833 06/22/22 1422 06/22/22 2001 06/23/22 0256  NA 144   < > 157*   < > 157*   < > 155*  150*   < > 159* 162*   < > 160* 161* 159* 158* 155*  K 3.2*   < > 4.2   < > 4.0  --  3.3*  --  3.4* 3.5  --  3.9  --   --   --  3.7  CL 112*   < > >130*   < > 129*  --  125*  --  124*  --   --  127*  --   --   --  120*  CO2 21*   < > 20*   < > 20*  --  21*  --  25  --   --  27  --   --   --  26  GLUCOSE 106*   < > 104*   < > 132*  --   153*  --  149*  --   --  116*  --   --   --  110*  BUN 39*   < > 39*   < > 32*  --  21*  --  21*  --   --  31*  --   --   --  26*  CREATININE 2.15*   < > 1.13*   < > 1.03*  --  0.73  --  0.87  --   --  0.97  --   --   --  0.84  CALCIUM 8.6*   < > 8.8*   < > 8.7*  --  9.0  --  8.7*  --   --  8.8*  --   --   --  8.9  MG 2.0  --  2.3  --   --   --   --   --   --   --   --   --   --   --   --   --  PHOS  --   --  2.6  --   --   --   --   --   --   --   --   --   --   --   --   --    < > = values in this interval not displayed.   Recent Labs  Lab 06/17/22 0145  AST 29  ALT 14  ALKPHOS 78  BILITOT 1.5*  PROT 8.9*  ALBUMIN 3.8   Recent Labs  Lab 06/17/22 0145 06/17/22 0922 06/18/22 0242 06/20/22 0403 06/21/22 0407 06/21/22 0816 06/22/22 0329 06/23/22 0256  WBC 22.2*  --  20.1* 15.8* 14.0*  --  13.5* 15.0*  NEUTROABS 19.9*  --   --   --   --   --   --   --   HGB 13.3   < > 9.3* 9.3* 9.2* 9.9* 8.3* 8.6*  HCT 43.6   < > 32.2* 31.5* 30.9* 29.0* 28.3* 29.7*  MCV 71.9*  --  75.8* 73.6* 73.2*  --  75.1* 75.4*  PLT 432*  --  284 300 298  --  297 305   < > = values in this interval not displayed.   Recent Labs  Lab 06/17/22 0759  CKTOTAL 67   No results for input(s): "LABPROT", "INR" in the last 72 hours. No results for input(s): "COLORURINE", "LABSPEC", "PHURINE", "GLUCOSEU", "HGBUR", "BILIRUBINUR", "KETONESUR", "PROTEINUR", "UROBILINOGEN", "NITRITE", "LEUKOCYTESUR" in the last 72 hours.  Invalid input(s): "APPERANCEUR"     Component Value Date/Time   CHOL 126 06/18/2022 0242   TRIG 123 06/21/2022 0407   HDL 45 06/18/2022 0242   CHOLHDL 2.8 06/18/2022 0242   VLDL 16 06/18/2022 0242   LDLCALC 65 06/18/2022 0242   Lab Results  Component Value Date   HGBA1C 5.9 (H) 06/18/2022      Component Value Date/Time   LABOPIA NONE DETECTED 06/17/2022 0206   COCAINSCRNUR POSITIVE (A) 06/17/2022 0206   LABBENZ NONE DETECTED 06/17/2022 0206   AMPHETMU NONE DETECTED 06/17/2022 0206    THCU NONE DETECTED 06/17/2022 0206   LABBARB NONE DETECTED 06/17/2022 0206    Recent Labs  Lab 06/17/22 0145  ETH <10    I have personally reviewed the radiological images below and agree with the radiology interpretations.  DG Chest Port 1 View  Result Date: 06/21/2022 CLINICAL DATA:  Left pneumothorax EXAM: PORTABLE CHEST 1 VIEW COMPARISON:  Previous studies including the examination done earlier today FINDINGS: There is interval decrease in size of small left apical pneumothorax. There is tiny residual pneumothorax in the medial left apex in the current study. Cardiac size is within normal limits. There are no signs of pulmonary edema or new focal infiltrates. Costophrenic angles are clear. Tip of left chest tube is seen in the medial left lower lung field. Tip of NG tube is seen in the fundus of the stomach. Tip of left IJ central venous catheter is seen in superior vena cava. Tip of endotracheal tube is 5.5 cm above the carina. IMPRESSION: There is interval decrease in size of left pneumothorax with tiny residual pneumothorax in the medial left apex. There are no new infiltrates or signs of pulmonary edema. Electronically Signed   By: Ernie Avena M.D.   On: 06/21/2022 13:24   DG CHEST PORT 1 VIEW  Result Date: 06/21/2022 CLINICAL DATA:  Pneumothorax EXAM: PORTABLE CHEST 1 VIEW COMPARISON:  CXR 06/20/22 FINDINGS: Left-sided pleural pigtail drainage catheter in place with unchanged positioning. There is  a small left apical pneumothorax, likely decreased in size compared prior exam. Left-sided central venous catheter with unchanged positioning. Enteric tube courses below diaphragm with the tip out of the field of view. Endotracheal tube terminates approximately 5 cm above the carina. No pleural effusion. No focal airspace opacity. Unchanged cardiac and mediastinal contours. No radiographically apparent new displaced rib fractures. Visualized upper abdomen is unremarkable. IMPRESSION: Small  left apical pneumothorax, likely decreased in size compared to prior exam. Thoracostomy tube in place. Electronically Signed   By: Lorenza Cambridge M.D.   On: 06/21/2022 10:01   DG Chest Port 1 View  Result Date: 06/20/2022 CLINICAL DATA:  Chest tube placement. EXAM: PORTABLE CHEST 1 VIEW COMPARISON:  One-view chest x-ray 06/20/2022 at 12:25 p.m. FINDINGS: The heart is enlarged. Endotracheal tube is stable. Left IJ catheter is stable. Gastric tube terminates in the fundus the stomach. A left sided pleural pigtail catheter was placed. The pneumothorax is reduced but not eliminated. IMPRESSION: 1. Interval placement of left-sided pleural pigtail catheter with reduction in left-sided pneumothorax. 2. Stable cardiomegaly without failure. Electronically Signed   By: Marin Roberts M.D.   On: 06/20/2022 17:50   DG CHEST PORT 1 VIEW  Addendum Date: 06/20/2022   ADDENDUM REPORT: 06/20/2022 16:05 ADDENDUM: Critical Value/emergent results were called by telephone at the time of interpretation on 06/20/2022 at 4:03 pm to provider Priscella Mann, RN, who verbally acknowledged these results. Electronically Signed   By: Signa Kell M.D.   On: 06/20/2022 16:05   Result Date: 06/20/2022 CLINICAL DATA:  Hypoxia. EXAM: PORTABLE CHEST 1 VIEW COMPARISON:  06/18/2018 for FINDINGS: ETT tip is stable above the carina. There is an enteric tube with tip coursing below the field of view. Left IJ catheter tip is in the distal SVC. There is a new moderate left-sided pneumothorax overlying the apex and lateral basal left lung. No signs of pleural effusion or edema. Persistent opacities within the right middle lobe. IMPRESSION: 1. New moderate left-sided pneumothorax. 2. Stable support apparatus. 3. Persistent right middle lobe opacities. Electronically Signed: By: Signa Kell M.D. On: 06/20/2022 15:41   CT HEAD WO CONTRAST ( )  Result Date: 06/20/2022 CLINICAL DATA:  Neuro deficit with acute stroke suspected EXAM: CT  HEAD WITHOUT CONTRAST TECHNIQUE: Contiguous axial images were obtained from the base of the skull through the vertex without intravenous contrast. RADIATION DOSE REDUCTION: This exam was performed according to the departmental dose-optimization program which includes automated exposure control, adjustment of the mA and/or kV according to patient size and/or use of iterative reconstruction technique. COMPARISON:  Yesterday FINDINGS: Brain: Large ICH centered at the right basal ganglia and adjacent white matter, with tracking towards the right brainstem is unchanged in shape and size, measuring up to 5 cm anterior to posterior and 4 cm craniocaudal. Regional edema is unchanged. Extensive cortical infarct in the right posterior frontal and parietal lobe. Smaller but more apparent left parietal cortex infarcts since brain MRI yesterday. Midline shift is 5 mm. Vascular: No hyperdense vessel or unexpected calcification. Skull: Normal. Negative for fracture or focal lesion. Sinuses/Orbits: No acute finding. IMPRESSION: 1. Multiple infarcts by brain MRI yesterday, suspect progressive cortex infarction in the left parietal lobe. 2. Unchanged appearance of right ICH with adjacent edema. Midline shift is 5 mm. Electronically Signed   By: Tiburcio Pea M.D.   On: 06/20/2022 06:09   CT HEAD WO CONTRAST ( )  Result Date: 06/19/2022 CLINICAL DATA:  Follow-up ICH EXAM: CT HEAD WITHOUT CONTRAST TECHNIQUE: Contiguous axial  images were obtained from the base of the skull through the vertex without intravenous contrast. RADIATION DOSE REDUCTION: This exam was performed according to the departmental dose-optimization program which includes automated exposure control, adjustment of the mA and/or kV according to patient size and/or use of iterative reconstruction technique. COMPARISON:  MRI from earlier today. FINDINGS: Brain: Large acute hemorrhage centered at the right basal ganglia and adjacent white matter without progression  in size or shape when compared to prior. Cytotoxic edema most apparent in the right frontal parietal cortex, infarcts being under appreciated when compared to preceding brain MRI. Diffuse effacement of subarachnoid spaces, leftward midline shift of 5 mm. Unchanged mild dilatation of the left lateral ventricle. Vascular: No hyperdense vessel or unexpected calcification. Skull: Normal. Negative for fracture or focal lesion. Sinuses/Orbits: No acute finding. IMPRESSION: Right ICH and extensive cortical infarction that is stable from brain MRI earlier today. No new abnormality. Leftward midline shift of 5 mm. Electronically Signed   By: Tiburcio Pea M.D.   On: 06/19/2022 11:13   MR BRAIN W WO CONTRAST  Result Date: 06/19/2022 CLINICAL DATA:  Hemorrhagic stroke workup EXAM: MRI HEAD WITHOUT AND WITH CONTRAST TECHNIQUE: Multiplanar, multiecho pulse sequences of the brain and surrounding structures were obtained without and with intravenous contrast. CONTRAST:  6mL GADAVIST GADOBUTROL 1 MMOL/ML IV SOLN COMPARISON:  Head CT and CTA from 2 days prior FINDINGS: Brain: Multiple areas of restricted diffusion most confluent in the right parietal and posterior temporal cortex. Restricted diffusion is seen surrounding the large and known right basal ganglia hemorrhage which measures up to 5.3 cm anterior to posterior with cleft of fluid surrounding the hematoma. Infarcts in other arterial distributions are also present, including along the left cerebral convexity (especially parietooccipital)and at the right brainstem. Midline shift measures 5 mm. There is periventricular FLAIR hyperintensity suggesting chronic small vessel disease. There is asymmetric mild dilatation of the left temporal horn. Vascular: Filling defect in the right sigmoid sinus and upper internal jugular vein. Skull and upper cervical spine: Normal marrow signal Sinuses/Orbits: Mild mucosal thickening in the mastoid air cells. Retention cysts in the right  maxillary sinus. Diffuse mucosal thickening in the paranasal sinuses. Negative orbits. IMPRESSION: 1. 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 The most confluent area is in the right MCA territory with infarct likely underlying the right basal ganglia hematoma. 2. 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 The pattern of infarcts and hemorrhage do not correlate with the clot location however. 3. Mild dilatation of the temporal horn of the left lateral ventricle. 5 mm of midline shift. Electronically Signed   By: Tiburcio Pea M.D.   On: 06/19/2022 06:59   DG Abd 1 View  Result Date: 06/18/2022 CLINICAL DATA:  Check gastric catheter placement EXAM: ABDOMEN - 1 VIEW COMPARISON:  Film from earlier in the same day. FINDINGS: Gastric catheter has been advanced and now lies completely within the stomach. Scattered large and small bowel gas is noted. IMPRESSION: Gastric catheter within the stomach as described. Electronically Signed   By: Alcide Clever M.D.   On: 06/18/2022 23:47   DG Abd 1 View  Result Date: 06/18/2022 CLINICAL DATA:  Feeding tube placement. EXAM: ABDOMEN - 1 VIEW COMPARISON:  Jun 18, 2022 (6:44 p.m.) FINDINGS: A nasogastric tube is seen with its distal tip overlying the expected region of the body of the stomach. The distal side hole sits at the level of  the gastroesophageal junction. The bowel gas pattern is normal. No radio-opaque calculi or other significant radiographic abnormality are seen. IMPRESSION: Nasogastric tube positioning, as described above. Further advancement of the NG tube by approximately 7 cm is recommended to decrease the risk of aspiration. Electronically Signed   By: Aram Candela M.D.   On: 06/18/2022 22:23   DG Abd Portable 1V  Result Date: 06/18/2022 CLINICAL DATA:  Check gastric catheter placement EXAM: PORTABLE ABDOMEN - 1 VIEW COMPARISON:  None Available.  FINDINGS: Scattered large and small bowel gas is noted. Gastric catheter is noted with the tip in the stomach. Proximal side port lies in the distal esophagus. This should be advanced several cm deeper into the stomach. IMPRESSION: Gastric catheter as described. This should be advanced deeper into the stomach. Electronically Signed   By: Alcide Clever M.D.   On: 06/18/2022 21:24   DG CHEST PORT 1 VIEW  Result Date: 06/18/2022 CLINICAL DATA:  Evaluate central line placement EXAM: PORTABLE CHEST 1 VIEW COMPARISON:  Jun 18, 2022 FINDINGS: The side port of the NG tube is near the GE junction. The distal tip is in the stomach. The ETT is in good position. A left central line is been placed in the interval terminating in the central SVC. No pneumothorax. The left lung is clear. Right middle lobe infiltrate remains. No other interval changes. IMPRESSION: 1. Support apparatus as above. The side port of the NG tube is near the GE junction. Recommend advancing the tube. 2. The left lung is clear. Right middle lobe infiltrate remains. 3. No other interval changes. Electronically Signed   By: Gerome Sam III M.D.   On: 06/18/2022 17:20   Overnight EEG with video  Result Date: 06/18/2022 Charlsie Quest, MD     06/19/2022  9:37 AM Patient Name: ELLA WARGEL MRN: [id number] Epilepsy Attending: Charlsie Quest Referring Physician/Provider: Erick Blinks, MD Duration: 06/17/2022 1215 to 06/18/2022 1215 Patient history: 47 y.o. female with hx of cocaine use p/w unresponsive and large R IPH with brain compression and 5mm midline shift. EEG to evaluate for seizure Level of alertness: Awake, asleep AEDs during EEG study: None Technical aspects: This EEG study was done with scalp electrodes positioned according to the 10-20 International system of electrode placement. Electrical activity was reviewed with band pass filter of 1-70Hz , sensitivity of 7 uV/mm, display speed of 41mm/sec with a 60Hz  notched filter applied  as appropriate. EEG data were recorded continuously and digitally stored.  Video monitoring was available and reviewed as appropriate. Description: The posterior dominant rhythm consists of 7.5 Hz activity of moderate voltage (25-35 uV) seen predominantly in posterior head regions, symmetric and reactive to eye opening and eye closing. Sleep was characterized by vertex waves, sleep spindles (12 to 14 Hz), maximal frontocentral region. EEG showed continuous 3 to 6 Hz theta-delta slowing in right hemisphere, maximal right temporal region. Intermittent generalized 3-6hz  theta-delta slowing was also noted. Hyperventilation and photic stimulation were not performed.   ABNORMALITY - Continuous slow, right hemisphere, maximal right temporal region - Intermittent slow, generalized IMPRESSION: This study is suggestive of cortical dysfunction arising from right hemisphere, maximal right temporal region likely secondary to underlying structural abnormality. Additionally there is mild to moderate diffuse encephalopathy. No seizures or epileptiform discharges were seen throughout the recording. Charlsie Quest   DG Chest Port 1 View  Result Date: 06/18/2022 CLINICAL DATA:  Pulmonary infiltrates EXAM: PORTABLE CHEST 1 VIEW COMPARISON:  06/17/2022 FINDINGS: Endotracheal tube is in stable  position. NG tube tip in the proximal stomach with the side port near the GE junction. The NG tube appears to be buckled/looped within the neck near the thoracic inlet. Heart mediastinal contours within normal limits. Right middle lobe airspace opacity again noted, stable. No confluent opacity on the left. No effusions or acute bony abnormality. IMPRESSION: Continued right middle lobe infiltrate, unchanged. NG tube appears to be buckled slashed looped in the upper esophagus near the thoracic inlet. Tip is in the proximal stomach. Electronically Signed   By: Charlett Nose M.D.   On: 06/18/2022 07:11   DG Abd 1 View  Result Date:  06/17/2022 CLINICAL DATA:  Nasogastric tube placement. EXAM: ABDOMEN - 1 VIEW COMPARISON:  Radiograph earlier today. FINDINGS: Tip of the enteric tube is below the diaphragm in the stomach, the side port is just at the gastroesophageal junction. Normal upper abdominal bowel gas pattern. IMPRESSION: Tip of the enteric tube below the diaphragm in the stomach, side-port just at the gastroesophageal junction. Electronically Signed   By: Narda Rutherford M.D.   On: 06/17/2022 15:43   ECHOCARDIOGRAM COMPLETE  Result Date: 06/17/2022    ECHOCARDIOGRAM REPORT   Patient Name:   INDIANNA SAYSON Date of Exam: 06/17/2022 Medical Rec #:  [id number]          Height:       65.0 in Accession #:    [id number]         Weight:       140.0 lb Date of Birth:  1975/12/21          BSA:          1.700 m Patient Age:    46 years           BP:           122/111 mmHg Patient Gender: F                  HR:           102 bpm. Exam Location:  Inpatient Procedure: 2D Echo, Cardiac Doppler and Color Doppler Indications:    Stroke  History:        Patient has no prior history of Echocardiogram examinations.                 ICH, sepsis; Risk Factors:Current Smoker and Substance abuse.  Sonographer:    Wallie Char Referring Phys: [id number] [facility]  Sonographer Comments: Technically challenging study due to limited acoustic windows and echo performed with patient supine and on artificial respirator. IMPRESSIONS  1. Left ventricular ejection fraction, by estimation, is 50 to 55%. The left ventricle has low normal function. The left ventricle has no regional wall motion abnormalities. There is moderate left ventricular hypertrophy. Left ventricular diastolic parameters are consistent with Grade I diastolic dysfunction (impaired relaxation).  2. Right ventricular systolic function is normal. The right ventricular size is normal. There is mildly elevated pulmonary artery systolic pressure.  3. The mitral valve is normal in structure.  Trivial mitral valve regurgitation. No evidence of mitral stenosis.  4. The aortic valve is tricuspid. Aortic valve regurgitation is not visualized. No aortic stenosis is present. FINDINGS  Left Ventricle: Left ventricular ejection fraction, by estimation, is 50 to 55%. The left ventricle has low normal function. The left ventricle has no regional wall motion abnormalities. The left ventricular internal cavity size was normal in size. There is moderate left ventricular hypertrophy. Left ventricular diastolic parameters are consistent with Grade I  diastolic dysfunction (impaired relaxation). Right Ventricle: The right ventricular size is normal. No increase in right ventricular wall thickness. Right ventricular systolic function is normal. There is mildly elevated pulmonary artery systolic pressure. The tricuspid regurgitant velocity is 2.74  m/s, and with an assumed right atrial pressure of 8 mmHg, the estimated right ventricular systolic pressure is 38.0 mmHg. Left Atrium: Left atrial size was normal in size. Right Atrium: Right atrial size was normal in size. Pericardium: There is no evidence of pericardial effusion. Mitral Valve: The mitral valve is normal in structure. Trivial mitral valve regurgitation. No evidence of mitral valve stenosis. MV peak gradient, 5.3 mmHg. The mean mitral valve gradient is 2.0 mmHg. Tricuspid Valve: The tricuspid valve is normal in structure. Tricuspid valve regurgitation is trivial. Aortic Valve: The aortic valve is tricuspid. Aortic valve regurgitation is not visualized. No aortic stenosis is present. Aortic valve mean gradient measures 4.0 mmHg. Aortic valve peak gradient measures 6.2 mmHg. Aortic valve area, by VTI measures 2.39 cm. Pulmonic Valve: The pulmonic valve was normal in structure. Pulmonic valve regurgitation is not visualized. Aorta: The aortic root and ascending aorta are structurally normal, with no evidence of dilitation. IAS/Shunts: The interatrial septum was  not well visualized.  LEFT VENTRICLE PLAX 2D LVIDd:         4.50 cm     Diastology LVIDs:         3.30 cm     LV e' medial:    6.63 cm/s LV PW:         1.10 cm     LV E/e' medial:  11.0 LV IVS:        1.00 cm     LV e' lateral:   5.86 cm/s LVOT diam:     1.90 cm     LV E/e' lateral: 12.4 LV SV:         40 LV SV Index:   23 LVOT Area:     2.84 cm  LV Volumes (MOD) LV vol d, MOD A2C: 68.4 ml LV vol d, MOD A4C: 77.5 ml LV vol s, MOD A2C: 33.9 ml LV vol s, MOD A4C: 37.8 ml LV SV MOD A2C:     34.5 ml LV SV MOD A4C:     77.5 ml LV SV MOD BP:      39.2 ml RIGHT VENTRICLE             IVC RV S prime:     13.90 cm/s  IVC diam: 1.50 cm TAPSE (M-mode): 1.6 cm LEFT ATRIUM             Index        RIGHT ATRIUM           Index LA diam:        3.00 cm 1.76 cm/m   RA Area:     10.40 cm LA Vol (A2C):   23.2 ml 13.65 ml/m  RA Volume:   20.40 ml  12.00 ml/m LA Vol (A4C):   19.2 ml 11.29 ml/m LA Biplane Vol: 21.2 ml 12.47 ml/m  AORTIC VALVE AV Area (Vmax):    2.85 cm AV Area (Vmean):   2.41 cm AV Area (VTI):     2.39 cm AV Vmax:           124.00 cm/s AV Vmean:          93.450 cm/s AV VTI:            0.166 m AV Peak Grad:  6.2 mmHg AV Mean Grad:      4.0 mmHg LVOT Vmax:         124.50 cm/s LVOT Vmean:        79.500 cm/s LVOT VTI:          0.140 m LVOT/AV VTI ratio: 0.84  AORTA Ao Root diam: 3.30 cm Ao Asc diam:  2.80 cm MITRAL VALVE               TRICUSPID VALVE MV Area (PHT): 4.06 cm    TR Peak grad:   30.0 mmHg MV Area VTI:   1.72 cm    TR Vmax:        274.00 cm/s MV Peak grad:  5.3 mmHg MV Mean grad:  2.0 mmHg    SHUNTS MV Vmax:       1.15 m/s    Systemic VTI:  0.14 m MV Vmean:      68.2 cm/s   Systemic Diam: 1.90 cm MV Decel Time: 187 msec MV E velocity: 72.90 cm/s MV A velocity: 89.60 cm/s MV E/A ratio:  0.81 Epifanio Lesches MD Electronically signed by Epifanio Lesches MD Signature Date/Time: 06/17/2022/10:47:05 AM    Final    CT VENOGRAM HEAD  Result Date: 06/17/2022 CLINICAL DATA:  Intracranial  hemorrhage. EXAM: CT VENOGRAM HEAD TECHNIQUE: Venographic phase images of the brain were obtained following the administration of intravenous contrast. Multiplanar reformats and maximum intensity projections were generated. RADIATION DOSE REDUCTION: This exam was performed according to the departmental dose-optimization program which includes automated exposure control, adjustment of the mA and/or kV according to patient size and/or use of iterative reconstruction technique. CONTRAST:  75mL OMNIPAQUE IOHEXOL 350 MG/ML SOLN COMPARISON:  None Available. FINDINGS: Filling defect of the right transverse sinus and right transverse sigmoid junction which are somewhat large and tubular for arachnoid granulations, although an arachnoid granulation at the left transverse sigmoid junction appears nearly similar. The location would also not explain the acute hemorrhage. No visible cortical or central venous thrombosis. IMPRESSION: No cortical or central venous thrombosis to explain the ICH. There are filling defects at the right transverse and transverse sigmoid dural sinuses which are prominent for arachnoid granulations but not definitive for thrombus. Attention at follow-up imaging. Electronically Signed   By: Tiburcio Pea M.D.   On: 06/17/2022 05:30   CT ANGIO HEAD NECK W WO CM  Result Date: 06/17/2022 CLINICAL DATA:  Found unresponsive. Intracranial hemorrhage. History of cocaine use EXAM: CT ANGIOGRAPHY HEAD AND NECK WITH AND WITHOUT CONTRAST TECHNIQUE: Multidetector CT imaging of the head and neck was performed using the standard protocol during bolus administration of intravenous contrast. Multiplanar CT image reconstructions and MIPs were obtained to evaluate the vascular anatomy. Carotid stenosis measurements (when applicable) are obtained utilizing NASCET criteria, using the distal internal carotid diameter as the denominator. RADIATION DOSE REDUCTION: This exam was performed according to the departmental  dose-optimization program which includes automated exposure control, adjustment of the mA and/or kV according to patient size and/or use of iterative reconstruction technique. CONTRAST:  75mL OMNIPAQUE IOHEXOL 350 MG/ML SOLN COMPARISON:  Head CT from earlier today FINDINGS: CTA NECK FINDINGS Aortic arch: Unremarkable Right carotid system: Mixed density plaque at the bifurcation with some low-density plaque bulging into the ICA bulb. No ulceration or flow limiting stenosis Left carotid system: Mainly low-density plaque at the bifurcation without stenosis or ulceration. Vertebral arteries: No proximal subclavian stenosis. The vertebral arteries are somewhat tortuous but smoothly contoured and diffusely patent Skeleton: No acute  or aggressive finding. Other neck: No acute finding Upper chest: Mild opacification of dependent lungs which could be atelectasis. Centrilobular emphysema. Review of the MIP images confirms the above findings CTA HEAD FINDINGS Anterior circulation: No aneurysm or spot sign seen underlying the ICH. There is a aneurysm projecting leftward from the left carotid terminus which measures 4 mm. No adjacent hemorrhage. No major branch occlusion. There is undulation of the bilateral intracranial branches which is likely atheromatous given findings in the neck. No segmental beading. Some less intense flow and right MCA branches is likely related to mass effect by the ICH. Posterior circulation: The vertebral and basilar arteries arediffusely patent. Undulation of the left more than right PCA with up to moderate narrowing on the left, likely atheromatous given the findings in the neck. Venous sinuses: Reference dedicated CT venogram Anatomic variants: No acute finding Review of the MIP images confirms the above findings IMPRESSION: 1. No vascular lesion or spot sign seen at the acute ICH. 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 3. 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  Electronically Signed   By: Tiburcio Pea M.D.   On: 06/17/2022 05:18   CT Head Wo Contrast  Result Date: 06/17/2022 CLINICAL DATA:  Altered mental status EXAM: CT HEAD WITHOUT CONTRAST TECHNIQUE: Contiguous axial images were obtained from the base of the skull through the vertex without intravenous contrast. RADIATION DOSE REDUCTION: This exam was performed according to the departmental dose-optimization program which includes automated exposure control, adjustment of the mA and/or kV according to patient size and/or use of iterative reconstruction technique. COMPARISON:  None Available. FINDINGS: Brain: There is a large area of parenchymal hemorrhage identified in the right cerebral hemisphere centered primarily within the basal ganglia and extending superiorly into the centrum semi ovale. This measures approximately 4.7 x 4.0 cm in greatest AP and transverse dimensions respectively. It extends for approximately 4.7 cm in craniocaudad dimension. Significant mass-effect upon the right lateral ventricle is noted. Mild midline shift of approximately 5 mm is noted. Some surrounding edema is seen as well. No other hemorrhage is noted. Relative loss of the sulcal markings is noted likely related to some increased intracranial pressure. Vascular: No hyperdense vessel or unexpected calcification. Skull: Normal. Negative for fracture or focal lesion. Sinuses/Orbits: No acute finding. Other: None. IMPRESSION: Right-sided intraparenchymal hemorrhage as described with associated edema and mild midline shift of 5 mm from right to left. Critical Value/emergent results were called by telephone at the time of interpretation on 06/17/2022 at 2:27 am to Dr. Donna Bernard , who verbally acknowledged these results. Electronically Signed   By: Alcide Clever M.D.   On: 06/17/2022 02:29   DG Abdomen 1 View  Result Date: 06/17/2022 CLINICAL DATA:  Check gastric catheter placement EXAM: ABDOMEN - 1 VIEW COMPARISON:  None Available.  FINDINGS: Gastric catheter is noted extending into the stomach. Proximal side port lies at the gastroesophageal junction. This should be advanced further into the stomach. No free air is seen. No bony abnormality is noted. IMPRESSION: Gastric catheter as described. Electronically Signed   By: Alcide Clever M.D.   On: 06/17/2022 02:16   DG Chest Port 1 View  Result Date: 06/17/2022 CLINICAL DATA:  Check endotracheal tube placement EXAM: PORTABLE CHEST 1 VIEW COMPARISON:  10/21/2020 FINDINGS: Cardiac shadow is within normal limits. Endotracheal tube is noted 1.5 cm above the carina. Gastric catheter is noted extending into the stomach. Patchy increased density is noted in the right middle lobe beneath the minor  fissure consistent with acute infiltrate. No sizable effusion is noted. No bony abnormality is seen. IMPRESSION: Right middle lobe infiltrate. Tubes and lines as described. Electronically Signed   By: Alcide Clever M.D.   On: 06/17/2022 02:16     PHYSICAL EXAM  Temp:  [96.4 F (35.8 C)-100.3 F (37.9 C)] 99.4 F (37.4 C) (05/16 0714) Pulse Rate:  [78-116] 94 (05/16 0737) Resp:  [20-33] 23 (05/16 0737) BP: (131-164)/(75-99) 155/93 (05/16 0737) SpO2:  [95 %-100 %] 98 % (05/16 0737) FiO2 (%):  [40 %] 40 % (05/16 0848) Weight:  [61.6 kg] 61.6 kg (05/16 0500)  General: well-nourished, well-developed middle aged female, intubated on sedation. Eyes: left pupil larger than right although improved from prior, both are reactive to light. CV: mildly tachycardic rate with regular rhythm, no m/r/g. Pulm: intubated on mechanical ventilation, breathing synchronous with ventilator. Neuro: She does open eyes to voice with left gaze preference. Blinks to visual threat on right. She is unable to track in either direction, maintains steady gaze. Left pupil remains slightly larger than right, but improved from prior. Bilateral pupils are reactive to light. Able to minimally wiggle toes on RLE. Able to subtly  move fingers in RUE in attempt to grasp my hand but inconsistent. Withdraws to noxious stimuli in RLE and RUE. L side remains flaccid.   ASSESSMENT/PLAN Ms. BREECE BROWE is a 47 y.o. female with history of cocaine abuse admitted for unresponsiveness.  Intubated in ER for airway protection.  No tPA given due to ICH.    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 Vasospasm from ICH vs. 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  2D Echo EF 50 to 55% LDL 65 HgbA1c 5.9 UDS positive for cocaine Heparin subcu for VTE prophylaxis No antithrombotic prior to admission, now on No antithrombotic due to ICH. Therapy recommendations: Pending Disposition: Pending, prognosis poor, palliative care on board. Daughter is coming soon for decision making  Cerebral edema CT right BG and CR large ICH, 5 mm midline shift CT repeat 5/12 stable ICH with leftward midline shift of 5 mm CT repeat 5/13 unchanged appearance of right ICH with adjacent edema. Midline shift is 5 mm. Na 151->155->157->159-> 155->158->150->159->162->161->159->158->155 off 3% saline Na monitoring LTM EEG cortical dysfunction arising from right hemisphere with  diffuse encephalopathy, no seizures  Respiratory failure Aspiration pneumonia Leukocytosis Pneumothorax  Intubated on vent Still on fentanyl, low dose CCM on board CXR right middle lobe infiltrate On azithromycin and Rocephin -> unasyn and zyvox Tmax 100.6->100.3->afebrile->100.3 WBC 22.2->20.1->15.8->14.0->13.5->15.0 S/p chest tube  Hypertension BP unstable Off cleviprex Labetalol and hydralazine IV PRN BP goal < 160 Long term BP goal normotensive  AKI - resolved Creatinine 2.15-1.32-1.03-0.73-0.87-0.97-0.84 On tube feeding  UTI UA WBC  21-50 On unasyn and zyvox Urine culture + STREPTOCOCCUS AGALACTIAE   Cocaine abuse History of cocaine abuse UDS positive for cocaine Cessation education will be provided  Dysphagia On tube feeding  Other Active Problems Cerebral aneurysm, CTA head and neck showed 4 mm left terminal ICA aneurysm, asymptomatic, outpatient follow-up  Hospital day # 6  Merrilyn Puma, MD [facility], PGY-3 06/23/2022, 9:21 AM  ATTENDING NOTE: I reviewed above note and agree with the assessment and plan. Pt was seen and examined.   No family at the bedside. Pt opens eyes on voice and tactile stimulation, seems able to wiggle right toes on commands, but did not follow other commands. Left pupil 2.5 and right pupil 1.5, both sluggish to light. Overall, neuro stable from yesterday. But pt still has tachycardia, tachypnea and Tmax 101.9. she is on Abx. Will need trach and PEG if aggressive care. Daughter is coming tomorrow, palliative care on board, will have family meeting for GOC discussion in pm  For detailed assessment and plan, please refer to above/below as I have made changes wherever appropriate.   Marvel Plan, MD PhD Stroke Neurology 06/23/2022 8:34 PM  This patient is critically ill due to large ICH, stroke, dural venous sinus thrombosis, cerebral edema, cocaine abuse, AKI, pneumonia, UTI and at significant risk of neurological worsening,  death form reherniation, brain death, seizure, sepsis. This patient's care requires constant monitoring of vital signs, hemodynamics, respiratory and cardiac monitoring, review of multiple databases, neurological assessment, discussion with family, other specialists and medical decision making of high complexity. I spent 35 minutes of neurocritical care time in the care of this patient. I discussed with Dr. Merrily Pew.   To contact Stroke Continuity provider, please refer to [email or web address]. After hours, contact General Neurology

## 2022-06-23 NOTE — TOC Progression Note (Signed)
Transition of Care [facility]) - Progression Note    Patient Details  Name: Rebecca [name] MRN: [id number] Date of Birth: April 16, 1975  [facility] CM/SW Contact  Tom-Johnson, Hershal Coria, RN Phone Number: 06/23/2022, 1:57 PM  Clinical Narrative:     Patient continues on Full vent. Neurology following, Palliative following for GOC. Continues on IV abx, has a rt chest tube. Daughter to arrive today from out of state and Palliative Care helping to schedule family meeting for GOC.   CM will continue to follow and render compassionate support during this time.            Expected Discharge Plan and Services                                               Social Determinants of Health (SDOH) Interventions SDOH Screenings   Tobacco Use: High Risk (10/21/2020)    Readmission Risk Interventions     No data to display

## 2022-06-24 ENCOUNTER — Inpatient Hospital Stay (HOSPITAL_COMMUNITY): Payer: Medicaid Other

## 2022-06-24 DIAGNOSIS — I953 Hypotension of hemodialysis: Secondary | ICD-10-CM | POA: Diagnosis not present

## 2022-06-24 DIAGNOSIS — J9601 Acute respiratory failure with hypoxia: Secondary | ICD-10-CM | POA: Diagnosis not present

## 2022-06-24 DIAGNOSIS — Z9689 Presence of other specified functional implants: Secondary | ICD-10-CM | POA: Diagnosis not present

## 2022-06-24 DIAGNOSIS — I610 Nontraumatic intracerebral hemorrhage in hemisphere, subcortical: Secondary | ICD-10-CM | POA: Diagnosis not present

## 2022-06-24 LAB — BASIC METABOLIC PANEL
Anion gap: 10 (ref 5–15)
BUN: 35 mg/dL — ABNORMAL HIGH (ref 6–20)
CO2: 25 mmol/L (ref 22–32)
Calcium: 8.6 mg/dL — ABNORMAL LOW (ref 8.9–10.3)
Chloride: 119 mmol/L — ABNORMAL HIGH (ref 98–111)
Creatinine, Ser: 1.15 mg/dL — ABNORMAL HIGH (ref 0.44–1.00)
GFR, Estimated: 59 mL/min — ABNORMAL LOW (ref >60)
Glucose, Bld: 96 mg/dL (ref 70–99)
Potassium: 3.5 mmol/L (ref 3.5–5.1)
Sodium: 154 mmol/L — ABNORMAL HIGH (ref 135–145)

## 2022-06-24 LAB — CBC
HCT: 29.5 % — ABNORMAL LOW (ref 36.0–46.0)
Hemoglobin: 8.5 g/dL — ABNORMAL LOW (ref 12.0–15.0)
MCH: 21.4 pg — ABNORMAL LOW (ref 26.0–34.0)
MCHC: 28.8 g/dL — ABNORMAL LOW (ref 30.0–36.0)
MCV: 74.3 fL — ABNORMAL LOW (ref 80.0–100.0)
Platelets: 351 10*3/uL (ref 150–400)
RBC: 3.97 MIL/uL (ref 3.87–5.11)
RDW: 19.9 % — ABNORMAL HIGH (ref 11.5–15.5)
WBC: 16.5 10*3/uL — ABNORMAL HIGH (ref 4.0–10.5)
nRBC: 0.3 % — ABNORMAL HIGH (ref 0.0–0.2)

## 2022-06-24 LAB — GLUCOSE, CAPILLARY
Glucose-Capillary: 88 mg/dL (ref 70–99)
Glucose-Capillary: 73 mg/dL (ref 70–99)
Glucose-Capillary: <10 mg/dL — CL (ref 70–99)
Glucose-Capillary: 92 mg/dL (ref 70–99)
Glucose-Capillary: 103 mg/dL — ABNORMAL HIGH (ref 70–99)
Glucose-Capillary: 108 mg/dL — ABNORMAL HIGH (ref 70–99)
Glucose-Capillary: 111 mg/dL — ABNORMAL HIGH (ref 70–99)

## 2022-06-24 MED ORDER — POTASSIUM CHLORIDE 20 MEQ PO PACK
40.0000 meq | PACK | Freq: Once | ORAL | Status: AC
  Administered 2022-06-24: 40 meq
  Filled 2022-06-24: qty 2

## 2022-06-24 MED ORDER — IPRATROPIUM-ALBUTEROL 0.5-2.5 (3) MG/3ML IN SOLN
3.0000 mL | Freq: Two times a day (BID) | RESPIRATORY_TRACT | Status: DC
  Administered 2022-06-24 – 2022-06-30 (×13): 3 mL via RESPIRATORY_TRACT
  Filled 2022-06-24 (×13): qty 3

## 2022-06-24 MED ORDER — ALTEPLASE 2 MG IJ SOLR
2.0000 mg | Freq: Once | INTRAMUSCULAR | Status: AC
  Administered 2022-06-24: 2 mg
  Filled 2022-06-24: qty 2

## 2022-06-24 MED ORDER — ALTEPLASE 2 MG IJ SOLR
2.0000 mg | Freq: Once | INTRAMUSCULAR | Status: AC
  Administered 2022-06-24: 2 mg

## 2022-06-24 NOTE — Progress Notes (Addendum)
STROKE TEAM PROGRESS NOTE   SUBJECTIVE (INTERVAL HISTORY)  Examined patient at bedside, no family present. She is on of fentanyl for ventilator sedation. Her neurological exam is worse than yesterday. She does not open eyes to verbal stimuli or sternal rub, although does open to noxious stimuli when applied to peripheral extremities. She had a fever of 101.39F overnight with ongoing infection, likely contributing to worsened neurological status. Unable to get her to follow any commands today and she is not withdrawing to pain in any of her extremities. Plan is for family meeting today at [facility] for GOC discussion.   OBJECTIVE Temp:  [98.3 F (36.8 C)-101.9 F (38.8 C)] 98.3 F (36.8 C) (05/17 1159) Pulse Rate:  [86-118] 92 (05/17 1200) Cardiac Rhythm: Normal sinus rhythm (05/17 0800) Resp:  [19-35] 26 (05/17 1200) BP: (86-140)/(62-90) 118/74 (05/17 1200) SpO2:  [94 %-100 %] 99 % (05/17 1200) FiO2 (%):  [40 %] 40 % (05/17 1103) Weight:  [61.4 kg] 61.4 kg (05/17 0500)  Recent Labs  Lab 06/23/22 2305 06/24/22 0303 06/24/22 0749 06/24/22 0750 06/24/22 1158  GLUCAP 121* 88 <10* 73 92   Recent Labs  Lab 06/18/22 0242 06/18/22 0616 06/20/22 0403 06/20/22 1520 06/21/22 0407 06/21/22 0816 06/21/22 2000 06/22/22 0329 06/22/22 8295 06/22/22 1422 06/22/22 2001 06/23/22 0256 06/24/22 0358  NA 157*   < > 155*  150*   < > 159* 162*   < > 160* 161* 159* 158* 155* 154*  K 4.2   < > 3.3*  --  3.4* 3.5  --  3.9  --   --   --  3.7 3.5  CL >130*   < > 125*  --  124*  --   --  127*  --   --   --  120* 119*  CO2 20*   < > 21*  --  25  --   --  27  --   --   --  26 25  GLUCOSE 104*   < > 153*  --  149*  --   --  116*  --   --   --  110* 96  BUN 39*   < > 21*  --  21*  --   --  31*  --   --   --  26* 35*  CREATININE 1.13*   < > 0.73  --  0.87  --   --  0.97  --   --   --  0.84 1.15*  CALCIUM 8.8*   < > 9.0  --  8.7*  --   --  8.8*  --   --   --  8.9 8.6*  MG 2.3  --   --   --   --   --    --   --   --   --   --   --   --   PHOS 2.6  --   --   --   --   --   --   --   --   --   --   --   --    < > = values in this interval not displayed.   No results for input(s): "AST", "ALT", "ALKPHOS", "BILITOT", "PROT", "ALBUMIN" in the last 168 hours.  Recent Labs  Lab 06/20/22 0403 06/21/22 0407 06/21/22 0816 06/22/22 0329 06/23/22 0256 06/24/22 0358  WBC 15.8* 14.0*  --  13.5* 15.0* 16.5*  HGB 9.3* 9.2* 9.9* 8.3*  8.6* 8.5*  HCT 31.5* 30.9* 29.0* 28.3* 29.7* 29.5*  MCV 73.6* 73.2*  --  75.1* 75.4* 74.3*  PLT 300 298  --  297 305 351   No results for input(s): "CKTOTAL", "CKMB", "CKMBINDEX", "TROPONINI" in the last 168 hours.  No results for input(s): "LABPROT", "INR" in the last 72 hours. No results for input(s): "COLORURINE", "LABSPEC", "PHURINE", "GLUCOSEU", "HGBUR", "BILIRUBINUR", "KETONESUR", "PROTEINUR", "UROBILINOGEN", "NITRITE", "LEUKOCYTESUR" in the last 72 hours.  Invalid input(s): "APPERANCEUR"     Component Value Date/Time   CHOL 126 06/18/2022 0242   TRIG 123 06/21/2022 0407   HDL 45 06/18/2022 0242   CHOLHDL 2.8 06/18/2022 0242   VLDL 16 06/18/2022 0242   LDLCALC 65 06/18/2022 0242   Lab Results  Component Value Date   HGBA1C 5.9 (H) 06/18/2022      Component Value Date/Time   LABOPIA NONE DETECTED 06/17/2022 0206   COCAINSCRNUR POSITIVE (A) 06/17/2022 0206   LABBENZ NONE DETECTED 06/17/2022 0206   AMPHETMU NONE DETECTED 06/17/2022 0206   THCU NONE DETECTED 06/17/2022 0206   LABBARB NONE DETECTED 06/17/2022 0206    No results for input(s): "ETH" in the last 168 hours.   I have personally reviewed the radiological images below and agree with the radiology interpretations.  DG Abd 1 View  Result Date: 06/24/2022 CLINICAL DATA:  OG tube placement EXAM: ABDOMEN - 1 VIEW COMPARISON:  06/24/2022 FINDINGS: OG tube is in place with the tip in the fundus of the stomach. Left basilar chest tube remains in place. Left base atelectasis. IMPRESSION: OG tube  tip in the fundus of the stomach. Electronically Signed   By: Charlett Nose M.D.   On: 06/24/2022 03:43   DG Abd 1 View  Result Date: 06/24/2022 CLINICAL DATA:  [id number] Encounter for imaging study to confirm orogastric (OG) tube placement [id number] EXAM: ABDOMEN - 1 VIEW COMPARISON:  X-ray abdomen 06/18/2022 FINDINGS: Enteric tube with tip and side port overlying the expected location of the gastric lumen. Left upper lobe pigtail. Gaseous distension of the large bowel. No radio-opaque calculi or other significant radiographic abnormality are seen. IMPRESSION: Enteric tube in good position. Electronically Signed   By: Tish Frederickson M.D.   On: 06/24/2022 02:06   DG CHEST PORT 1 VIEW  Result Date: 06/23/2022 CLINICAL DATA:  [id number] Pneumothorax [id number] EXAM: PORTABLE CHEST 1 VIEW COMPARISON:  Jun 21, 2022 FINDINGS: The cardiomediastinal silhouette is unchanged in contour.LEFT-sided chest tube. LEFT neck CVC tip terminates over the SVC. ETT tip terminates 6.7 cm above the carina. Enteric tube tip and side port project over the esophagus. Favored trace LEFT pleural effusion. No significant pneumothorax. Bibasilar atelectasis. IMPRESSION: 1. Favored trace LEFT pleural effusion. No significant pneumothorax. 2. Enteric tube tip and side port project over the esophagus. Recommend advancement. Electronically Signed   By: Meda Klinefelter M.D.   On: 06/23/2022 14:00   DG Chest Port 1 View  Result Date: 06/21/2022 CLINICAL DATA:  Left pneumothorax EXAM: PORTABLE CHEST 1 VIEW COMPARISON:  Previous studies including the examination done earlier today FINDINGS: There is interval decrease in size of small left apical pneumothorax. There is tiny residual pneumothorax in the medial left apex in the current study. Cardiac size is within normal limits. There are no signs of pulmonary edema or new focal infiltrates. Costophrenic angles are clear. Tip of left chest tube is seen in the medial left lower lung field. Tip of NG tube  is seen in the fundus of the stomach. Tip of left IJ  central venous catheter is seen in superior vena cava. Tip of endotracheal tube is 5.5 cm above the carina. IMPRESSION: There is interval decrease in size of left pneumothorax with tiny residual pneumothorax in the medial left apex. There are no new infiltrates or signs of pulmonary edema. Electronically Signed   By: Ernie Avena M.D.   On: 06/21/2022 13:24   DG CHEST PORT 1 VIEW  Result Date: 06/21/2022 CLINICAL DATA:  Pneumothorax EXAM: PORTABLE CHEST 1 VIEW COMPARISON:  CXR 06/20/22 FINDINGS: Left-sided pleural pigtail drainage catheter in place with unchanged positioning. There is a small left apical pneumothorax, likely decreased in size compared prior exam. Left-sided central venous catheter with unchanged positioning. Enteric tube courses below diaphragm with the tip out of the field of view. Endotracheal tube terminates approximately 5 cm above the carina. No pleural effusion. No focal airspace opacity. Unchanged cardiac and mediastinal contours. No radiographically apparent new displaced rib fractures. Visualized upper abdomen is unremarkable. IMPRESSION: Small left apical pneumothorax, likely decreased in size compared to prior exam. Thoracostomy tube in place. Electronically Signed   By: Lorenza Cambridge M.D.   On: 06/21/2022 10:01   DG Chest Port 1 View  Result Date: 06/20/2022 CLINICAL DATA:  Chest tube placement. EXAM: PORTABLE CHEST 1 VIEW COMPARISON:  One-view chest x-ray 06/20/2022 at 12:25 p.m. FINDINGS: The heart is enlarged. Endotracheal tube is stable. Left IJ catheter is stable. Gastric tube terminates in the fundus the stomach. A left sided pleural pigtail catheter was placed. The pneumothorax is reduced but not eliminated. IMPRESSION: 1. Interval placement of left-sided pleural pigtail catheter with reduction in left-sided pneumothorax. 2. Stable cardiomegaly without failure. Electronically Signed   By: Marin Roberts M.D.    On: 06/20/2022 17:50   DG CHEST PORT 1 VIEW  Addendum Date: 06/20/2022   ADDENDUM REPORT: 06/20/2022 16:05 ADDENDUM: Critical Value/emergent results were called by telephone at the time of interpretation on 06/20/2022 at 4:03 pm to provider Priscella Mann, RN, who verbally acknowledged these results. Electronically Signed   By: Signa Kell M.D.   On: 06/20/2022 16:05   Result Date: 06/20/2022 CLINICAL DATA:  Hypoxia. EXAM: PORTABLE CHEST 1 VIEW COMPARISON:  06/18/2018 for FINDINGS: ETT tip is stable above the carina. There is an enteric tube with tip coursing below the field of view. Left IJ catheter tip is in the distal SVC. There is a new moderate left-sided pneumothorax overlying the apex and lateral basal left lung. No signs of pleural effusion or edema. Persistent opacities within the right middle lobe. IMPRESSION: 1. New moderate left-sided pneumothorax. 2. Stable support apparatus. 3. Persistent right middle lobe opacities. Electronically Signed: By: Signa Kell M.D. On: 06/20/2022 15:41   CT HEAD WO CONTRAST ( )  Result Date: 06/20/2022 CLINICAL DATA:  Neuro deficit with acute stroke suspected EXAM: CT HEAD WITHOUT CONTRAST TECHNIQUE: Contiguous axial images were obtained from the base of the skull through the vertex without intravenous contrast. RADIATION DOSE REDUCTION: This exam was performed according to the departmental dose-optimization program which includes automated exposure control, adjustment of the mA and/or kV according to patient size and/or use of iterative reconstruction technique. COMPARISON:  Yesterday FINDINGS: Brain: Large ICH centered at the right basal ganglia and adjacent white matter, with tracking towards the right brainstem is unchanged in shape and size, measuring up to 5 cm anterior to posterior and 4 cm craniocaudal. Regional edema is unchanged. Extensive cortical infarct in the right posterior frontal and parietal lobe. Smaller but more apparent left parietal  cortex infarcts since brain MRI yesterday. Midline shift is 5 mm. Vascular: No hyperdense vessel or unexpected calcification. Skull: Normal. Negative for fracture or focal lesion. Sinuses/Orbits: No acute finding. IMPRESSION: 1. Multiple infarcts by brain MRI yesterday, suspect progressive cortex infarction in the left parietal lobe. 2. Unchanged appearance of right ICH with adjacent edema. Midline shift is 5 mm. Electronically Signed   By: Tiburcio Pea M.D.   On: 06/20/2022 06:09   CT HEAD WO CONTRAST ( )  Result Date: 06/19/2022 CLINICAL DATA:  Follow-up ICH EXAM: CT HEAD WITHOUT CONTRAST TECHNIQUE: Contiguous axial images were obtained from the base of the skull through the vertex without intravenous contrast. RADIATION DOSE REDUCTION: This exam was performed according to the departmental dose-optimization program which includes automated exposure control, adjustment of the mA and/or kV according to patient size and/or use of iterative reconstruction technique. COMPARISON:  MRI from earlier today. FINDINGS: Brain: Large acute hemorrhage centered at the right basal ganglia and adjacent white matter without progression in size or shape when compared to prior. Cytotoxic edema most apparent in the right frontal parietal cortex, infarcts being under appreciated when compared to preceding brain MRI. Diffuse effacement of subarachnoid spaces, leftward midline shift of 5 mm. Unchanged mild dilatation of the left lateral ventricle. Vascular: No hyperdense vessel or unexpected calcification. Skull: Normal. Negative for fracture or focal lesion. Sinuses/Orbits: No acute finding. IMPRESSION: Right ICH and extensive cortical infarction that is stable from brain MRI earlier today. No new abnormality. Leftward midline shift of 5 mm. Electronically Signed   By: Tiburcio Pea M.D.   On: 06/19/2022 11:13   MR BRAIN W WO CONTRAST  Result Date: 06/19/2022 CLINICAL DATA:  Hemorrhagic stroke workup EXAM: MRI HEAD  WITHOUT AND WITH CONTRAST TECHNIQUE: Multiplanar, multiecho pulse sequences of the brain and surrounding structures were obtained without and with intravenous contrast. CONTRAST:  6mL GADAVIST GADOBUTROL 1 MMOL/ML IV SOLN COMPARISON:  Head CT and CTA from 2 days prior FINDINGS: Brain: Multiple areas of restricted diffusion most confluent in the right parietal and posterior temporal cortex. Restricted diffusion is seen surrounding the large and known right basal ganglia hemorrhage which measures up to 5.3 cm anterior to posterior with cleft of fluid surrounding the hematoma. Infarcts in other arterial distributions are also present, including along the left cerebral convexity (especially parietooccipital)and at the right brainstem. Midline shift measures 5 mm. There is periventricular FLAIR hyperintensity suggesting chronic small vessel disease. There is asymmetric mild dilatation of the left temporal horn. Vascular: Filling defect in the right sigmoid sinus and upper internal jugular vein. Skull and upper cervical spine: Normal marrow signal Sinuses/Orbits: Mild mucosal thickening in the mastoid air cells. Retention cysts in the right maxillary sinus. Diffuse mucosal thickening in the paranasal sinuses. Negative orbits. IMPRESSION: 1. 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 The most confluent area is in the right MCA territory with infarct likely underlying the right basal ganglia hematoma. 2. 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 The pattern of infarcts and hemorrhage do not correlate with the clot location however. 3. Mild dilatation of the temporal horn of the left lateral ventricle. 5 mm of midline shift. Electronically Signed   By: Tiburcio Pea M.D.   On: 06/19/2022 06:59   DG Abd 1 View  Result Date: 06/18/2022 CLINICAL DATA:  Check gastric catheter placement EXAM: ABDOMEN - 1 VIEW COMPARISON:   Film from earlier in the same day. FINDINGS:  Gastric catheter has been advanced and now lies completely within the stomach. Scattered large and small bowel gas is noted. IMPRESSION: Gastric catheter within the stomach as described. Electronically Signed   By: Alcide Clever M.D.   On: 06/18/2022 23:47   DG Abd 1 View  Result Date: 06/18/2022 CLINICAL DATA:  Feeding tube placement. EXAM: ABDOMEN - 1 VIEW COMPARISON:  Jun 18, 2022 (6:44 p.m.) FINDINGS: A nasogastric tube is seen with its distal tip overlying the expected region of the body of the stomach. The distal side hole sits at the level of the gastroesophageal junction. The bowel gas pattern is normal. No radio-opaque calculi or other significant radiographic abnormality are seen. IMPRESSION: Nasogastric tube positioning, as described above. Further advancement of the NG tube by approximately 7 cm is recommended to decrease the risk of aspiration. Electronically Signed   By: Aram Candela M.D.   On: 06/18/2022 22:23   DG Abd Portable 1V  Result Date: 06/18/2022 CLINICAL DATA:  Check gastric catheter placement EXAM: PORTABLE ABDOMEN - 1 VIEW COMPARISON:  None Available. FINDINGS: Scattered large and small bowel gas is noted. Gastric catheter is noted with the tip in the stomach. Proximal side port lies in the distal esophagus. This should be advanced several cm deeper into the stomach. IMPRESSION: Gastric catheter as described. This should be advanced deeper into the stomach. Electronically Signed   By: Alcide Clever M.D.   On: 06/18/2022 21:24   DG CHEST PORT 1 VIEW  Result Date: 06/18/2022 CLINICAL DATA:  Evaluate central line placement EXAM: PORTABLE CHEST 1 VIEW COMPARISON:  Jun 18, 2022 FINDINGS: The side port of the NG tube is near the GE junction. The distal tip is in the stomach. The ETT is in good position. A left central line is been placed in the interval terminating in the central SVC. No pneumothorax. The left lung is clear. Right  middle lobe infiltrate remains. No other interval changes. IMPRESSION: 1. Support apparatus as above. The side port of the NG tube is near the GE junction. Recommend advancing the tube. 2. The left lung is clear. Right middle lobe infiltrate remains. 3. No other interval changes. Electronically Signed   By: Gerome Sam III M.D.   On: 06/18/2022 17:20   Overnight EEG with video  Result Date: 06/18/2022 Charlsie Quest, MD     06/19/2022  9:37 AM Patient Name: ALYTHIA RORKE MRN: [id number] Epilepsy Attending: Charlsie Quest Referring Physician/Provider: Erick Blinks, MD Duration: 06/17/2022 1215 to 06/18/2022 1215 Patient history: 47 y.o. female with hx of cocaine use p/w unresponsive and large R IPH with brain compression and 5mm midline shift. EEG to evaluate for seizure Level of alertness: Awake, asleep AEDs during EEG study: None Technical aspects: This EEG study was done with scalp electrodes positioned according to the 10-20 International system of electrode placement. Electrical activity was reviewed with band pass filter of 1-70Hz , sensitivity of 7 uV/mm, display speed of 62mm/sec with a 60Hz  notched filter applied as appropriate. EEG data were recorded continuously and digitally stored.  Video monitoring was available and reviewed as appropriate. Description: The posterior dominant rhythm consists of 7.5 Hz activity of moderate voltage (25-35 uV) seen predominantly in posterior head regions, symmetric and reactive to eye opening and eye closing. Sleep was characterized by vertex waves, sleep spindles (12 to 14 Hz), maximal frontocentral region. EEG showed continuous 3 to 6 Hz theta-delta slowing in right hemisphere, maximal right temporal region. Intermittent generalized 3-6hz  theta-delta slowing was  also noted. Hyperventilation and photic stimulation were not performed.   ABNORMALITY - Continuous slow, right hemisphere, maximal right temporal region - Intermittent slow, generalized  IMPRESSION: This study is suggestive of cortical dysfunction arising from right hemisphere, maximal right temporal region likely secondary to underlying structural abnormality. Additionally there is mild to moderate diffuse encephalopathy. No seizures or epileptiform discharges were seen throughout the recording. Charlsie Quest   DG Chest Port 1 View  Result Date: 06/18/2022 CLINICAL DATA:  Pulmonary infiltrates EXAM: PORTABLE CHEST 1 VIEW COMPARISON:  06/17/2022 FINDINGS: Endotracheal tube is in stable position. NG tube tip in the proximal stomach with the side port near the GE junction. The NG tube appears to be buckled/looped within the neck near the thoracic inlet. Heart mediastinal contours within normal limits. Right middle lobe airspace opacity again noted, stable. No confluent opacity on the left. No effusions or acute bony abnormality. IMPRESSION: Continued right middle lobe infiltrate, unchanged. NG tube appears to be buckled slashed looped in the upper esophagus near the thoracic inlet. Tip is in the proximal stomach. Electronically Signed   By: Charlett Nose M.D.   On: 06/18/2022 07:11   DG Abd 1 View  Result Date: 06/17/2022 CLINICAL DATA:  Nasogastric tube placement. EXAM: ABDOMEN - 1 VIEW COMPARISON:  Radiograph earlier today. FINDINGS: Tip of the enteric tube is below the diaphragm in the stomach, the side port is just at the gastroesophageal junction. Normal upper abdominal bowel gas pattern. IMPRESSION: Tip of the enteric tube below the diaphragm in the stomach, side-port just at the gastroesophageal junction. Electronically Signed   By: Narda Rutherford M.D.   On: 06/17/2022 15:43   ECHOCARDIOGRAM COMPLETE  Result Date: 06/17/2022    ECHOCARDIOGRAM REPORT   Patient Name:   EDELYN WILKES Date of Exam: 06/17/2022 Medical Rec #:  [id number]          Height:       65.0 in Accession #:    [id number]         Weight:       140.0 lb Date of Birth:  [birth year]          BSA:          1.700 m  Patient Age:    46 years           BP:           122/111 mmHg Patient Gender: F                  HR:           102 bpm. Exam Location:  Inpatient Procedure: 2D Echo, Cardiac Doppler and Color Doppler Indications:    Stroke  History:        Patient has no prior history of Echocardiogram examinations.                 ICH, sepsis; Risk Factors:Current Smoker and Substance abuse.  Sonographer:    Wallie Char Referring Phys: [id number] [facility]  Sonographer Comments: Technically challenging study due to limited acoustic windows and echo performed with patient supine and on artificial respirator. IMPRESSIONS  1. Left ventricular ejection fraction, by estimation, is 50 to 55%. The left ventricle has low normal function. The left ventricle has no regional wall motion abnormalities. There is moderate left ventricular hypertrophy. Left ventricular diastolic parameters are consistent with Grade I diastolic dysfunction (impaired relaxation).  2. Right ventricular systolic function is normal. The right ventricular size is normal. There  is mildly elevated pulmonary artery systolic pressure.  3. The mitral valve is normal in structure. Trivial mitral valve regurgitation. No evidence of mitral stenosis.  4. The aortic valve is tricuspid. Aortic valve regurgitation is not visualized. No aortic stenosis is present. FINDINGS  Left Ventricle: Left ventricular ejection fraction, by estimation, is 50 to 55%. The left ventricle has low normal function. The left ventricle has no regional wall motion abnormalities. The left ventricular internal cavity size was normal in size. There is moderate left ventricular hypertrophy. Left ventricular diastolic parameters are consistent with Grade I diastolic dysfunction (impaired relaxation). Right Ventricle: The right ventricular size is normal. No increase in right ventricular wall thickness. Right ventricular systolic function is normal. There is mildly elevated pulmonary artery systolic  pressure. The tricuspid regurgitant velocity is 2.74  m/s, and with an assumed right atrial pressure of 8 mmHg, the estimated right ventricular systolic pressure is 38.0 mmHg. Left Atrium: Left atrial size was normal in size. Right Atrium: Right atrial size was normal in size. Pericardium: There is no evidence of pericardial effusion. Mitral Valve: The mitral valve is normal in structure. Trivial mitral valve regurgitation. No evidence of mitral valve stenosis. MV peak gradient, 5.3 mmHg. The mean mitral valve gradient is 2.0 mmHg. Tricuspid Valve: The tricuspid valve is normal in structure. Tricuspid valve regurgitation is trivial. Aortic Valve: The aortic valve is tricuspid. Aortic valve regurgitation is not visualized. No aortic stenosis is present. Aortic valve mean gradient measures 4.0 mmHg. Aortic valve peak gradient measures 6.2 mmHg. Aortic valve area, by VTI measures 2.39 cm. Pulmonic Valve: The pulmonic valve was normal in structure. Pulmonic valve regurgitation is not visualized. Aorta: The aortic root and ascending aorta are structurally normal, with no evidence of dilitation. IAS/Shunts: The interatrial septum was not well visualized.  LEFT VENTRICLE PLAX 2D LVIDd:         4.50 cm     Diastology LVIDs:         3.30 cm     LV e' medial:    6.63 cm/s LV PW:         1.10 cm     LV E/e' medial:  11.0 LV IVS:        1.00 cm     LV e' lateral:   5.86 cm/s LVOT diam:     1.90 cm     LV E/e' lateral: 12.4 LV SV:         40 LV SV Index:   23 LVOT Area:     2.84 cm  LV Volumes (MOD) LV vol d, MOD A2C: 68.4 ml LV vol d, MOD A4C: 77.5 ml LV vol s, MOD A2C: 33.9 ml LV vol s, MOD A4C: 37.8 ml LV SV MOD A2C:     34.5 ml LV SV MOD A4C:     77.5 ml LV SV MOD BP:      39.2 ml RIGHT VENTRICLE             IVC RV S prime:     13.90 cm/s  IVC diam: 1.50 cm TAPSE (M-mode): 1.6 cm LEFT ATRIUM             Index        RIGHT ATRIUM           Index LA diam:        3.00 cm 1.76 cm/m   RA Area:     10.40 cm LA Vol (A2C):    23.2 ml 13.65  ml/m  RA Volume:   20.40 ml  12.00 ml/m LA Vol (A4C):   19.2 ml 11.29 ml/m LA Biplane Vol: 21.2 ml 12.47 ml/m  AORTIC VALVE AV Area (Vmax):    2.85 cm AV Area (Vmean):   2.41 cm AV Area (VTI):     2.39 cm AV Vmax:           124.00 cm/s AV Vmean:          93.450 cm/s AV VTI:            0.166 m AV Peak Grad:      6.2 mmHg AV Mean Grad:      4.0 mmHg LVOT Vmax:         124.50 cm/s LVOT Vmean:        79.500 cm/s LVOT VTI:          0.140 m LVOT/AV VTI ratio: 0.84  AORTA Ao Root diam: 3.30 cm Ao Asc diam:  2.80 cm MITRAL VALVE               TRICUSPID VALVE MV Area (PHT): 4.06 cm    TR Peak grad:   30.0 mmHg MV Area VTI:   1.72 cm    TR Vmax:        274.00 cm/s MV Peak grad:  5.3 mmHg MV Mean grad:  2.0 mmHg    SHUNTS MV Vmax:       1.15 m/s    Systemic VTI:  0.14 m MV Vmean:      68.2 cm/s   Systemic Diam: 1.90 cm MV Decel Time: 187 msec MV E velocity: 72.90 cm/s MV A velocity: 89.60 cm/s MV E/A ratio:  0.81 Epifanio Lesches MD Electronically signed by Epifanio Lesches MD Signature Date/Time: 06/17/2022/10:47:05 AM    Final    CT VENOGRAM HEAD  Result Date: 06/17/2022 CLINICAL DATA:  Intracranial hemorrhage. EXAM: CT VENOGRAM HEAD TECHNIQUE: Venographic phase images of the brain were obtained following the administration of intravenous contrast. Multiplanar reformats and maximum intensity projections were generated. RADIATION DOSE REDUCTION: This exam was performed according to the departmental dose-optimization program which includes automated exposure control, adjustment of the mA and/or kV according to patient size and/or use of iterative reconstruction technique. CONTRAST:  75mL OMNIPAQUE IOHEXOL 350 MG/ML SOLN COMPARISON:  None Available. FINDINGS: Filling defect of the right transverse sinus and right transverse sigmoid junction which are somewhat large and tubular for arachnoid granulations, although an arachnoid granulation at the left transverse sigmoid junction appears nearly  similar. The location would also not explain the acute hemorrhage. No visible cortical or central venous thrombosis. IMPRESSION: No cortical or central venous thrombosis to explain the ICH. There are filling defects at the right transverse and transverse sigmoid dural sinuses which are prominent for arachnoid granulations but not definitive for thrombus. Attention at follow-up imaging. Electronically Signed   By: Tiburcio Pea M.D.   On: 06/17/2022 05:30   CT ANGIO HEAD NECK W WO CM  Result Date: 06/17/2022 CLINICAL DATA:  Found unresponsive. Intracranial hemorrhage. History of cocaine use EXAM: CT ANGIOGRAPHY HEAD AND NECK WITH AND WITHOUT CONTRAST TECHNIQUE: Multidetector CT imaging of the head and neck was performed using the standard protocol during bolus administration of intravenous contrast. Multiplanar CT image reconstructions and MIPs were obtained to evaluate the vascular anatomy. Carotid stenosis measurements (when applicable) are obtained utilizing NASCET criteria, using the distal internal carotid diameter as the denominator. RADIATION DOSE REDUCTION: This exam was performed according to the departmental  dose-optimization program which includes automated exposure control, adjustment of the mA and/or kV according to patient size and/or use of iterative reconstruction technique. CONTRAST:  75mL OMNIPAQUE IOHEXOL 350 MG/ML SOLN COMPARISON:  Head CT from earlier today FINDINGS: CTA NECK FINDINGS Aortic arch: Unremarkable Right carotid system: Mixed density plaque at the bifurcation with some low-density plaque bulging into the ICA bulb. No ulceration or flow limiting stenosis Left carotid system: Mainly low-density plaque at the bifurcation without stenosis or ulceration. Vertebral arteries: No proximal subclavian stenosis. The vertebral arteries are somewhat tortuous but smoothly contoured and diffusely patent Skeleton: No acute or aggressive finding. Other neck: No acute finding Upper chest: Mild  opacification of dependent lungs which could be atelectasis. Centrilobular emphysema. Review of the MIP images confirms the above findings CTA HEAD FINDINGS Anterior circulation: No aneurysm or spot sign seen underlying the ICH. There is a aneurysm projecting leftward from the left carotid terminus which measures 4 mm. No adjacent hemorrhage. No major branch occlusion. There is undulation of the bilateral intracranial branches which is likely atheromatous given findings in the neck. No segmental beading. Some less intense flow and right MCA branches is likely related to mass effect by the ICH. Posterior circulation: The vertebral and basilar arteries arediffusely patent. Undulation of the left more than right PCA with up to moderate narrowing on the left, likely atheromatous given the findings in the neck. Venous sinuses: Reference dedicated CT venogram Anatomic variants: No acute finding Review of the MIP images confirms the above findings IMPRESSION: 1. No vascular lesion or spot sign seen at the acute ICH. 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 3. 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 Electronically Signed   By: Tiburcio Pea M.D.   On: 06/17/2022 05:18   CT Head Wo Contrast  Result Date: 06/17/2022 CLINICAL DATA:  Altered mental status EXAM: CT HEAD WITHOUT CONTRAST TECHNIQUE: Contiguous axial images were obtained from the base of the skull through the vertex without intravenous contrast. RADIATION DOSE REDUCTION: This exam was performed according to the departmental dose-optimization program which includes automated exposure control, adjustment of the mA and/or kV according to patient size and/or use of iterative reconstruction technique. COMPARISON:  None Available. FINDINGS: Brain: There is a large area of parenchymal hemorrhage identified in the right cerebral hemisphere centered primarily within the basal ganglia and extending superiorly into the centrum semi ovale.  This measures approximately 4.7 x 4.0 cm in greatest AP and transverse dimensions respectively. It extends for approximately 4.7 cm in craniocaudad dimension. Significant mass-effect upon the right lateral ventricle is noted. Mild midline shift of approximately 5 mm is noted. Some surrounding edema is seen as well. No other hemorrhage is noted. Relative loss of the sulcal markings is noted likely related to some increased intracranial pressure. Vascular: No hyperdense vessel or unexpected calcification. Skull: Normal. Negative for fracture or focal lesion. Sinuses/Orbits: No acute finding. Other: None. IMPRESSION: Right-sided intraparenchymal hemorrhage as described with associated edema and mild midline shift of 5 mm from right to left. Critical Value/emergent results were called by telephone at the time of interpretation on 06/17/2022 at 2:27 am to Dr. Donna Bernard , who verbally acknowledged these results. Electronically Signed   By: Alcide Clever M.D.   On: 06/17/2022 02:29   DG Abdomen 1 View  Result Date: 06/17/2022 CLINICAL DATA:  Check gastric catheter placement EXAM: ABDOMEN - 1 VIEW COMPARISON:  None Available. FINDINGS: Gastric catheter is noted extending into the stomach. Proximal side port lies at  the gastroesophageal junction. This should be advanced further into the stomach. No free air is seen. No bony abnormality is noted. IMPRESSION: Gastric catheter as described. Electronically Signed   By: Alcide Clever M.D.   On: 06/17/2022 02:16   DG Chest Port 1 View  Result Date: 06/17/2022 CLINICAL DATA:  Check endotracheal tube placement EXAM: PORTABLE CHEST 1 VIEW COMPARISON:  10/21/2020 FINDINGS: Cardiac shadow is within normal limits. Endotracheal tube is noted 1.5 cm above the carina. Gastric catheter is noted extending into the stomach. Patchy increased density is noted in the right middle lobe beneath the minor fissure consistent with acute infiltrate. No sizable effusion is noted. No bony  abnormality is seen. IMPRESSION: Right middle lobe infiltrate. Tubes and lines as described. Electronically Signed   By: Alcide Clever M.D.   On: 06/17/2022 02:16     PHYSICAL EXAM  Temp:  [98.3 F (36.8 C)-101.9 F (38.8 C)] 98.3 F (36.8 C) (05/17 1159) Pulse Rate:  [86-118] 92 (05/17 1200) Resp:  [19-35] 26 (05/17 1200) BP: (86-140)/(62-90) 118/74 (05/17 1200) SpO2:  [94 %-100 %] 99 % (05/17 1200) FiO2 (%):  [40 %] 40 % (05/17 1103) Weight:  [61.4 kg] 61.4 kg (05/17 0500)  General: critically ill middle aged female, intubated on sedation. CV: tachycardic rate with regular rhythm, no m/r/g. Pulm: intubated on mechanical ventilation, coarse breath sounds bilaterally. Neuro: Does not open eyes to verbal stimuli or sternal rub. Does open eyes to noxious stimuli applied to peripheral extremities. Pupils are pinpoint, left pupil slightly larger than right, both sluggishly reactive to light. Does blink to visual threat on the right. Unable to track in either direction. Does not move fingers or toes to command. Does not withdraw in any extremity to noxious stimuli.   ASSESSMENT/PLAN Ms. REBECA RATKOWSKI is a 47 y.o. female with history of cocaine abuse admitted for unresponsiveness.  Intubated in ER for airway protection.  No tPA given due to ICH.    ICH:  right BG and CR ICH, still more likely due to cocaine induced hypertension vs. 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 Vasospasm from ICH vs. Cocaine vasculopathy CVST: right sigmoid sinus and IJ DVST, could be from mass effect CT right BG and CR large ICH, 5 mm midline shift CT head and neck 4 mm left terminal ICA aneurysm CTV no venous thrombosis MRI multiple acute infarcts in various distributions suggesting central embolic disease with confluent area in right MCA territory with infarct likely underlying right basal ganglia ICH, dural venous sinus  thrombosis affecting right sigmoid sinus and extending into upper right IJ, 5 mm of midline shift CT repeat 5/12 stable ICH with leftward midline shift of 5 mm CT repeat 5/13 Multiple infarcts by brain MRI yesterday, suspect progressive cortex infarction in the left parietal lobe.  2D Echo EF 50 to 55% LDL 65 HgbA1c 5.9 UDS positive for cocaine Heparin subcu for VTE prophylaxis No antithrombotic prior to admission, now on No antithrombotic due to ICH. Therapy recommendations: Pending Disposition: Pending, prognosis poor, palliative care on board. Family meeting to be held alongside palliative care tomorrow 1PM.   Cerebral edema CT right BG and CR large ICH, 5 mm midline shift CT repeat 5/12 stable ICH with leftward midline shift of 5 mm CT repeat 5/13 unchanged appearance of right ICH with adjacent edema. Midline shift is 5 mm. Na 151->155->157->159-> 155->158->150->159->162->161->159->158->155 -> 154 off 3% saline Na monitoring LTM EEG  cortical dysfunction arising from right hemisphere with diffuse encephalopathy, no seizures  Respiratory failure Aspiration pneumonia Leukocytosis Pneumothorax  Intubated on vent Still on fentanyl, low dose CCM on board CXR right middle lobe infiltrate On azithromycin and Rocephin -> unasyn and zyvox Tmax 100.6->100.3->afebrile->100.3 -> 101.9 WBC 22.2->20.1->15.8->14.0->13.5->15.0 -> 16.5 S/p chest tube  Hypertension BP unstable Off cleviprex Labetalol and hydralazine IV PRN BP goal < 160 Long term BP goal normotensive  AKI  Creatinine 2.15-1.32-1.03-0.73-0.87-0.97-0.84 - 1.15 On tube feeding  UTI UA WBC 21-50 On unasyn and zyvox Urine culture + STREPTOCOCCUS AGALACTIAE   Cocaine abuse History of cocaine abuse UDS positive for cocaine Cessation education will be provided  Dysphagia On tube feeding  Other Active Problems Cerebral aneurysm, CTA head and 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 Discussed with PCCM, possibly due to underlying infection and fevers. Her overall prognosis remains poor. Family meeting to be held today at [facility] alongside palliative care to discuss GOC. Will need trach and PEG if aggressive care pursued.   Merrilyn Puma, MD [facility], PGY-3 06/24/2022, 1:40 PM  ATTENDING NOTE: I reviewed above note and agree with the assessment and plan. Pt was seen and examined.   No family at bedside.  Patient still intubated, no tachycardia or tachypnea, however less responsive as yesterday, open eyes with repetitive stimulation, more left gaze position, blinking to visual threat on the right, not on the left.  Slight withdrawal to pain on right upper and lower extremity.  Left still plegia.  Respiratory improved, however neuro condition no significant improvement.  Daughter coming from [place] state, arriving tomorrow.  Family meeting along with pelvic care service at 1 PM.  For detailed assessment and plan, please refer to above/below as I have made changes wherever appropriate.   Marvel Plan, MD PhD Stroke Neurology 06/24/2022 7:29 PM  This patient is critically ill due to large ICH, stroke, dural venous sinus thrombosis, cerebral edema, cocaine abuse, AKI, pneumonia, UTI and at significant risk of neurological worsening, death form reherniation, brain death, seizure, sepsis. This patient's care requires constant monitoring of vital signs, hemodynamics, respiratory and cardiac monitoring, review of multiple databases, neurological assessment, discussion with family, other specialists and medical decision making of high complexity. I spent 35 minutes of neurocritical care time in the care of this patient.  To contact Stroke Continuity provider, please refer to [email or web address]. After hours, contact General Neurology

## 2022-06-24 NOTE — Progress Notes (Signed)
  Palliative Medicine Inpatient Consult Note   Patients daughter's flight got delayed another day. She is now on her way in from [place]. We plan to meet tomorrow around 1 PM for further conversations.   No Charge.  ______________________________________________________________________________________ Lamarr Lulas [place] Palliative Medicine Team Team Cell Phone: [phone] Please utilize secure chat with additional questions, if there is no response within 30 minutes please call the above phone number  Palliative Medicine Team providers are available by phone from 7am to 7pm daily and can be reached through the team cell phone.  Should this patient require assistance outside of these hours, please call the patient's attending physician.

## 2022-06-24 NOTE — Plan of Care (Signed)
Patient remains in Medical ICU intubated, sedated. NIHSS = 23. Febrile, tachycardic overnight (Elink notified). Chest tube present and patent to left side. CVC to LIJ transduced to assess placement.   Problem: Education: Goal: Knowledge of General Education information will improve Description: Including pain rating scale, medication(s)/side effects and non-pharmacologic comfort measures Outcome: Not Progressing   Problem: Health Behavior/Discharge Planning: Goal: Ability to manage health-related needs will improve Outcome: Not Progressing   Problem: Clinical Measurements: Goal: Ability to maintain clinical measurements within normal limits will improve Outcome: Not Progressing Goal: Will remain free from infection Outcome: Not Progressing Goal: Diagnostic test results will improve Outcome: Not Progressing Goal: Respiratory complications will improve Outcome: Not Progressing Goal: Cardiovascular complication will be avoided Outcome: Not Progressing   Problem: Activity: Goal: Risk for activity intolerance will decrease Outcome: Not Progressing   Problem: Nutrition: Goal: Adequate nutrition will be maintained Outcome: Not Progressing   Problem: Coping: Goal: Level of anxiety will decrease Outcome: Not Progressing   Problem: Elimination: Goal: Will not experience complications related to bowel motility Outcome: Not Progressing Goal: Will not experience complications related to urinary retention Outcome: Not Progressing   Problem: Pain Managment: Goal: General experience of comfort will improve Outcome: Not Progressing   Problem: Safety: Goal: Ability to remain free from injury will improve Outcome: Not Progressing   Problem: Skin Integrity: Goal: Risk for impaired skin integrity will decrease Outcome: Not Progressing   Problem: Education: Goal: Knowledge of disease or condition will improve Outcome: Not Progressing Goal: Knowledge of secondary prevention will  improve (MUST DOCUMENT ALL) Outcome: Not Progressing Goal: Knowledge of patient specific risk factors will improve Loraine Leriche N/A or DELETE if not current risk factor) Outcome: Not Progressing   Problem: Intracerebral Hemorrhage Tissue Perfusion: Goal: Complications of Intracerebral Hemorrhage will be minimized Outcome: Not Progressing   Problem: Coping: Goal: Will verbalize positive feelings about self Outcome: Not Progressing Goal: Will identify appropriate support needs Outcome: Not Progressing   Problem: Health Behavior/Discharge Planning: Goal: Ability to manage health-related needs will improve Outcome: Not Progressing Goal: Goals will be collaboratively established with patient/family Outcome: Not Progressing   Problem: Self-Care: Goal: Ability to participate in self-care as condition permits will improve Outcome: Not Progressing Goal: Verbalization of feelings and concerns over difficulty with self-care will improve Outcome: Not Progressing Goal: Ability to communicate needs accurately will improve Outcome: Not Progressing   Problem: Nutrition: Goal: Risk of aspiration will decrease Outcome: Not Progressing Goal: Dietary intake will improve Outcome: Not Progressing

## 2022-06-24 NOTE — Progress Notes (Signed)
Pharmacy Antibiotic Note  Rebecca [name] is a 47 y.o. female admitted on 06/17/2022 with aspiration pneumonia. Patient was originally started on ceftriaxone and azithromycin therapy for CAP, but has continued to have fevers. Pharmacy has been consulted for Unasyn (ampicillin-sulbactam) dosing.  WBC 16.5, Tmax 101.9 SCr trending up to 1.15  Plan: Continue Unasyn 3g IV q6h  Continue Linezolid 600mg  IV q12h per MD Monitor daily CBC, temp, SCr, and for clinical signs of improvement  F/u cultures and de-escalate antibiotics as able  F/u GOC family meeting on 5/17  Height: 5\' 5"  (165.1 cm) Weight: 61.4 kg (135 lb 5.8 oz) IBW/kg (Calculated) : 57  Temp (24hrs), Avg:99.7 F (37.6 C), Min:98.3 F (36.8 C), Max:101.9 F (38.8 C)  Recent Labs  Lab 06/17/22 1630 06/17/22 1802 06/17/22 2002 06/20/22 0403 06/21/22 0407 06/22/22 0329 06/23/22 0256 06/24/22 0358  WBC  --   --    < > 15.8* 14.0* 13.5* 15.0* 16.5*  CREATININE 1.30*  --    < > 0.73 0.87 0.97 0.84 1.15*  LATICACIDVEN 1.2 2.0*  --   --   --   --   --   --    < > = values in this interval not displayed.     Estimated Creatinine Clearance: 54.4 mL/min (A) (by C-G formula based on SCr of 1.15 mg/dL (H)).    No Known Allergies  Antimicrobials this admission: Metronidazole 5/10 x1 Azithromycin 5/10 >> 5/12 Ceftriaxone 5/10 >> 5/13 Unasyn 5/13 >>   Dose adjustments this admission: N/A  Microbiology results: 5/10 MRSA PCR: detected 5/11 trach aspirate: MRSA (S: gent, tetracycline, vanc, clinda, rifampin, linezolid) 5/11 Bld cx: no growth (final)  Thank you for allowing pharmacy to be a part of this patient's care.  Wilburn Cornelia, PharmD, BCPS Clinical Pharmacist 06/24/2022 2:03 PM   Please refer to AMION for pharmacy phone number

## 2022-06-24 NOTE — Progress Notes (Signed)
NAME:  Rebecca [name], MRN:  [id number], DOB:  Apr 27, 1975, LOS: 7 ADMISSION DATE:  06/17/2022, CONSULTATION DATE:  06/24/22 REFERRING MD:  EDP, CHIEF COMPLAINT:  unresponsive   History of Present Illness:  47 yo female with hx of polysubstance abuse presented brought to [facility] ED with AMS.  Intubated for airway protection.  CT head showed large Rt ICH with 5 mm midline shift.  UDS positive for cocaine.  Transferred to [facility] for further management.  PCCM consulted to assist with management in ICU.   Hx from chart and medical team.  Pertinent  Medical History  Substance abuse  Significant Hospital Events: Including procedures, antibiotic start and stop dates in addition to other pertinent events   5/10 Admit, neurosurgery consulted 5/11 remains on ventilator with hypertonic saline and EEG in place. CVC placed.  5/12 no acute events overnight, MRI obtained results pending  Interim History / Subjective:  -No acute events overnight -SBT this morning -Fentanyl drip only  Objective   Blood pressure 113/81, pulse (!) 102, temperature 99.2 F (37.3 C), temperature source Axillary, resp. rate (!) 23, height 5\' 5"  (1.651 m), weight 61.4 kg, SpO2 95 %.    Vent Mode: PSV;CPAP FiO2 (%):  [40 %] 40 % Set Rate:  [18 bmp] 18 bmp Vt Set:  [480 mL] 480 mL PEEP:  [5 cmH20-8 cmH20] 5 cmH20 Pressure Support:  [8 cmH20] 8 cmH20 Plateau Pressure:  [18 cmH20-25 cmH20] 18 cmH20   Intake/Output Summary (Last 24 hours) at 06/24/2022 0906 Last data filed at 06/24/2022 0600 Gross per 24 hour  Intake 2102.73 ml  Output 2460 ml  Net -357.27 ml   Filed Weights   06/22/22 0137 06/23/22 0500 06/24/22 0500  Weight: 60.7 kg 61.6 kg 61.4 kg   Physical exam General: Acute ill-appearing adult female lying in bed on mechanical ventilation in no acute distress HEENT: ETT, MM pink/moist Neuro: Follows commands. Pupils equal, sluggish. Cough and gag intact. Withdraws to pain in RUE, RLE, flicker in LLE. No  response in LUE.  CV: s1s2 regular rate and rhythm, no murmur, rubs, or gallops. PULM: Coarse breath sounds bilaterally. No increased work of breathing, tolerating ventilator. GI: Moderate distension, but soft. BS+ Tolerating TF Extremities: warm/dry, no edema  Skin: no rashes or lesions  Resolved Hospital Problem list     Assessment & Plan:   Acute right basal ganglia intraparenchymal hemorrhage Cerebral edema with brain compression R MCA Stroke and B ACA punctate infarcts Acute R Sigmoid sinus thrombosis and IJ DVST Induced hypernatremia and hyperchloremia Neurological status is continuing to improve - pupils are now equal on exam, still following simple commands. No changes in left-sided plegia. Plan for goals of care discussion this afternoon with patient's daughter. -Primary management per neurology, appreciate assistance -Hypertonic saline off -Trend sodium  Acute Hypoxic Respiratory Failure Bilateral multifocal pneumonia with MRSA L Pneumothorax s/p chest tube  She is stable on vent wean this morning, although not ready for extubation as her mental status is still poor with weak cough. May need a trach if family wishes to pursue long-term care, goals of care discussion planned for today. She was febrile to 101.58F yesterday afternoon, but will defer further antibiotic changes after family meeting. -Continue Unasyn and Linezolid  -Continue chest tube under waterseal, off suction  Hypertensive Emergency - improved -Hydralazine for BP goal<160 -Amlodipine 10mg  daily -Lisinopril 40mg  daily  Pre-renal AKI - 2/2 diuresis Creatinine 1.15 likely secondary to diuresis. 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 -Continuous  telemetry  Hypokalemia - resolved -Continue to monitor  Urinary tract infection Treated with CTX x 4 days with empiric PNA coverage  Best Practice:   Diet/type: tubefeeds DVT prophylaxis: SCD GI prophylaxis: PPI Lines: N/A Foley:  Yes, and it is still needed Code Status:  full code Last date of multidisciplinary goals of care discussion: Per primary  Critical care time:

## 2022-06-24 NOTE — Progress Notes (Signed)
[facility] ADULT ICU REPLACEMENT PROTOCOL   The patient does apply for the [facility] Adult ICU Electrolyte Replacment Protocol based on the criteria listed below:   1.Exclusion criteria: TCTS, ECMO, Dialysis, and Myasthenia Gravis patients 2. Is GFR >/= 30 ml/min? Yes.    Patient's GFR today is 59 3. Is SCr </= 2? Yes.   Patient's SCr is 1.15 mg/dL 4. Did SCr increase >/= 0.5 in 24 hours? No. 5.Pt's weight >40kg  Yes.   6. Abnormal electrolyte(s):   K 3.5  7. Electrolytes replaced per protocol 8.  Call MD STAT for K+ </= 2.5, Phos </= 1, or Mag </= 1 Physician:  Shawn Stall R Shantika Bermea 06/24/2022 6:07 AM

## 2022-06-24 NOTE — Progress Notes (Signed)
eLink Physician-Brief Progress Note Patient Name: Rebecca [name] DOB: February 17, 1975 MRN: [id number]   Date of Service  06/24/2022  HPI/Events of Note  OG tube position is somewhat atypical on previous KUB.  eICU Interventions  Will keep patient NPO and repeat KUB.        Thomasene Lot [name] 06/24/2022, 2:58 AM

## 2022-06-25 DIAGNOSIS — I610 Nontraumatic intracerebral hemorrhage in hemisphere, subcortical: Secondary | ICD-10-CM | POA: Diagnosis not present

## 2022-06-25 DIAGNOSIS — Z7189 Other specified counseling: Secondary | ICD-10-CM | POA: Diagnosis not present

## 2022-06-25 DIAGNOSIS — Z515 Encounter for palliative care: Secondary | ICD-10-CM | POA: Diagnosis not present

## 2022-06-25 DIAGNOSIS — J9601 Acute respiratory failure with hypoxia: Secondary | ICD-10-CM | POA: Diagnosis not present

## 2022-06-25 LAB — BASIC METABOLIC PANEL
Anion gap: 10 (ref 5–15)
BUN: 43 mg/dL — ABNORMAL HIGH (ref 6–20)
CO2: 27 mmol/L (ref 22–32)
Calcium: 8.3 mg/dL — ABNORMAL LOW (ref 8.9–10.3)
Chloride: 116 mmol/L — ABNORMAL HIGH (ref 98–111)
Creatinine, Ser: 1.64 mg/dL — ABNORMAL HIGH (ref 0.44–1.00)
GFR, Estimated: 39 mL/min — ABNORMAL LOW (ref >60)
Glucose, Bld: 133 mg/dL — ABNORMAL HIGH (ref 70–99)
Potassium: 3.6 mmol/L (ref 3.5–5.1)
Sodium: 153 mmol/L — ABNORMAL HIGH (ref 135–145)

## 2022-06-25 LAB — GLUCOSE, CAPILLARY
Glucose-Capillary: 135 mg/dL — ABNORMAL HIGH (ref 70–99)
Glucose-Capillary: 97 mg/dL (ref 70–99)
Glucose-Capillary: 87 mg/dL (ref 70–99)
Glucose-Capillary: 102 mg/dL — ABNORMAL HIGH (ref 70–99)

## 2022-06-25 LAB — CBC
HCT: 26.3 % — ABNORMAL LOW (ref 36.0–46.0)
Hemoglobin: 7.6 g/dL — ABNORMAL LOW (ref 12.0–15.0)
MCH: 21.9 pg — ABNORMAL LOW (ref 26.0–34.0)
MCHC: 28.9 g/dL — ABNORMAL LOW (ref 30.0–36.0)
MCV: 75.8 fL — ABNORMAL LOW (ref 80.0–100.0)
Platelets: 354 10*3/uL (ref 150–400)
RBC: 3.47 MIL/uL — ABNORMAL LOW (ref 3.87–5.11)
RDW: 19.5 % — ABNORMAL HIGH (ref 11.5–15.5)
WBC: 20.7 10*3/uL — ABNORMAL HIGH (ref 4.0–10.5)
nRBC: 0.2 % (ref 0.0–0.2)

## 2022-06-25 LAB — CULTURE, BLOOD (ROUTINE X 2): Culture: NO GROWTH

## 2022-06-25 MED ORDER — POTASSIUM CHLORIDE 10 MEQ/50ML IV SOLN
10.0000 meq | INTRAVENOUS | Status: AC
  Administered 2022-06-25 (×3): 10 meq via INTRAVENOUS
  Filled 2022-06-25 (×3): qty 50

## 2022-06-25 MED ORDER — SODIUM CHLORIDE 0.9 % IV BOLUS
500.0000 mL | Freq: Once | INTRAVENOUS | Status: AC
  Administered 2022-06-25: 500 mL via INTRAVENOUS

## 2022-06-25 NOTE — Progress Notes (Signed)
eLink Physician-Brief Progress Note Patient Name: Rebecca [name] DOB: 1975/03/09 MRN: [id number]   Date of Service  06/25/2022  HPI/Events of Note  BP 96/67, MAP 77, CVP 3, patient is oliguric. K+ 3.6.  eICU Interventions  NS 500 ml iv fluid bolus ordered x 1. KCL 10 meq iv Q 1 hour x 3 doses ordered.        [name] 06/25/2022, 5:26 AM

## 2022-06-25 NOTE — Progress Notes (Addendum)
Palliative Medicine Inpatient Follow Up Note HPI: 47 yo female with hx of polysubstance abuse presented brought to [facility] ED with AMS. Identified to have significant R sided ICH. Is receiving care in the ICU as is intubation. Neurologically has a poor prognosis therefore the Palliative care team has been asked to get involved for further conversations.   Today's Discussion 06/25/2022  *Please note that this is a verbal dictation therefore any spelling or grammatical errors are due to the "[employer]agon [employer] One" system interpretation.  Chart reviewed inclusive of vital signs, progress notes, laboratory results, and diagnostic images.   A meeting was held with patients daughter, Rebecca [name] and Rebecca boyfriend who was present for support. We reviewed with Dr. Viviann Spare patients poor prognosis from a neurological recovery standpoint given the multiple strokes the patient has experienced. He shares that as of presently we are at day eight which gives [name] a clearer idea in terms of what the long term may look like. We reviewed that [name] will likely be confined to a bed with a tracheostomy and have little quality in Rebecca life. Patients daughter was asked if this would be acceptable or not to [place] if she could speak for herself. Rebecca [name] shares that this would not be what Rebecca [name] would desire. Created space and opportunity for Rebecca [name] to explore thoughts feelings and fears regarding Rebecca mothers current medical situation.  We discussed either continuing down the present aggressive path of medical care or changing our focus to comfort oriented care. We talked about transition to comfort measures in house and what that would entail inclusive of medications to control pain, dyspnea, agitation, nausea, itching, and hiccups.  We discussed stopping all uneccessary measures such as mechanical ventilator, cardiac monitoring, blood draws, needle sticks, and frequent vital signs.   Patients daughter is in agreement with  transitioning our focus to comfort care though she first wants to allow family and friends to visit. She shares the plan for them to come around noon tomorrow to say their goodbyes and then transitioning our focus and proceeding with extubation.   In the meanwhile Rebecca [name] is agreeable to no blood draws, diagnostic imaging, no escalation of care and a full DNAR.   Questions and concerns addressed/Palliative Support Provided.   Objective Assessment: Vital Signs Vitals:   06/25/22 1600 06/25/22 1630  BP: 108/72 102/67  Pulse: 75 77  Resp: 15 18  Temp: 98.8 F (37.1 C)   SpO2: 96% 95%    Intake/Output Summary (Last 24 hours) at 06/25/2022 1658 Last data filed at 06/25/2022 1600 Gross per 24 hour  Intake 3426.75 ml  Output 695 ml  Net 2731.75 ml   Last Weight  Most recent update: 06/25/2022  5:10 AM    Weight  62.8 kg (138 lb 7.2 oz)            Gen: Middle-aged African-American female chronically ill in appearance HEENT: OGT, ETT, dry mucous membranes CV: Irregular rate and rhythm PULM: On mechanical ventilatory support ABD: Slight distention soft hypoactive bowel sounds EXT: Peripheral edema edema  Neuro: On sedatives, withdraws to pain  SUMMARY OF RECOMMENDATIONS   DNAR  Stop blood draws and diagnostic imaging  No care escalation  Plan for compassionate extubation tomorrow around noon  Will assist patients daughter with FMLA  Will provide information on local crematoriums  Ongoing PMT support  Total Time: 56 Billing based on MDM: High ______________________________________________________________________________________ Lamarr Lulas [place] [facility] Team Team Cell Phone: [phone] Please utilize secure chat with  additional questions, if there is no response within 30 minutes please call the above phone number  Palliative Medicine Team providers are available by phone from 7am to 7pm daily and can be reached through the team cell phone.   Should this patient require assistance outside of these hours, please call the patient's attending physician.

## 2022-06-25 NOTE — Progress Notes (Signed)
Serum Creatinine has continued to increase. Pt is on Lisinopril. The dose was held today for soft BP, so we will discontinue for now. There are prn's for BP  in the event her BP increases.

## 2022-06-25 NOTE — Progress Notes (Addendum)
NAME:  Rebecca [name], MRN:  [id number], DOB:  January 10, 1976, LOS: 8 ADMISSION DATE:  06/17/2022, CONSULTATION DATE:  06/25/22 REFERRING MD:  EDP, CHIEF COMPLAINT:  unresponsive   History of Present Illness:  47 yo female with hx of polysubstance abuse presented brought to [facility] ED with AMS.  Intubated for airway protection.  CT head showed large Rt ICH with 5 mm midline shift.  UDS positive for cocaine.  Transferred to [facility] for further management.  PCCM consulted to assist with management in ICU.   Hx from chart and medical team.  Pertinent  Medical History  Substance abuse  Significant Hospital Events: Including procedures, antibiotic start and stop dates in addition to other pertinent events   5/10 Admit, neurosurgery consulted 5/11 remains on ventilator with hypertonic saline and EEG in place. CVC placed.  5/12 no acute events overnight MRI brain 5/12 >> multiple acute infarcts in various distributions consistent with central embolic process, most affected right MCA territory.  Right basal ganglia hematoma.  Dural venous sinus thrombosis in the right sigmoid sinus extending up into the upper right IJ.  Mild dilation temporal horn left lateral ventricle with 5 mm of midline shift  Interim History / Subjective:  Fever on 5/17, 101.9 Seen by palliative care on 5/17 Rising serum creatinine, 1.64 Sodium stable 153 (induced) 0.40, PEEP 8.  Doing some PSV this morning I/O+ 9.8 L total Fentanyl 50  Objective   Blood pressure 110/73, pulse 86, temperature 99.3 F (37.4 C), temperature source Esophageal, resp. rate (!) 33, height 5\' 5"  (1.651 m), weight 62.8 kg, SpO2 93 %. CVP:  [2 mmHg-23 mmHg] 6 mmHg  Vent Mode: PSV;CPAP FiO2 (%):  [40 %] 40 % Set Rate:  [18 bmp] 18 bmp Vt Set:  [480 mL] 480 mL PEEP:  [8 cmH20] 8 cmH20 Pressure Support:  [5 cmH20] 5 cmH20 Plateau Pressure:  [16 cmH20-26 cmH20] 26 cmH20   Intake/Output Summary (Last 24 hours) at 06/25/2022 4098 Last data filed at  06/25/2022 0900 Gross per 24 hour  Intake 3062.08 ml  Output 700 ml  Net 2362.08 ml   Filed Weights   06/23/22 0500 06/24/22 0500 06/25/22 0500  Weight: 61.6 kg 61.4 kg 62.8 kg   Physical exam General: Acute and chronic ill-appearing woman laying in bed on mechanical ventilation HEENT: ET tube in place, oropharynx clear Neuro: Pupils now equal, sluggish.  She does turn to voice, track.  Cough intact.  Follows commands on the right, subtle withdrawal from pain on the left but no other movement.  CV: Regular, distant, no murmur PULM: Coarse bilaterally.  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 Acute R Sigmoid sinus thrombosis and IJ DVST Induced hypernatremia and hyperchloremia -Hypertonic saline now discontinued, following sodium -Appreciate neurology management -Not in a position for anticoagulation given the basal ganglier bleed -Appreciate palliative care input, planning to meet with family at 1300 on 5/18  Acute Hypoxic Respiratory Failure Bilateral multifocal pneumonia with MRSA L Pneumothorax s/p chest tube  -Initiate spontaneous breathing trials as she is able to tolerate -Wean sedation per PAD protocol -Continue chest tube in place while she is on positive pressure, currently on waterseal, plan to change back to  suction while she is on positive pressure.  Follow chest x-ray -Unasyn, linezolid as ordered  Hypertensive Emergency - improved, good control currently. 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 -Telemetry as above  Hypokalemia - resolved -Following BMP  Urinary tract infection Treated with CTX x 4 days, also with empiric PNA coverage  Best Practice:   Diet/type: tubefeeds DVT prophylaxis: SCD GI prophylaxis: PPI Lines: N/A Foley:  Yes, and it is still needed Code Status:  full code Last date of multidisciplinary goals of care discussion: Palliative care consulted, meeting planned 5/18  Critical care time: 32 minutes    Levy Pupa, MD, PhD 06/25/2022, 9:10 AM [facility] [phone] or if no answer before 7:00PM call [phone] For any issues after 7:00PM please call eLink [phone]

## 2022-06-25 NOTE — Progress Notes (Addendum)
STROKE TEAM PROGRESS NOTE   SUBJECTIVE (INTERVAL HISTORY)  No family at bedside.  Discussed with ICU nurse there is no change in exam since yesterday.  She did have a fever overnight blood culture sent on antibiotics.  She was a oliguric overnight bolused with 500 cc and potassium replaced.  Will intermittently follow commands on the right side however not consistent.  Family meeting planned for 1 PM today.  Discussed with palliative.  Potassium replaced this morning.   OBJECTIVE Temp:  [97.2 F (36.2 C)-103.4 F (39.7 C)] 97.2 F (36.2 C) (05/18 1100) Pulse Rate:  [78-122] 78 (05/18 1100) Cardiac Rhythm: Normal sinus rhythm;Sinus tachycardia (05/18 0725) Resp:  [18-36] 35 (05/18 1100) BP: (89-131)/(59-87) 109/64 (05/18 1100) SpO2:  [90 %-100 %] 96 % (05/18 1100) FiO2 (%):  [40 %] 40 % (05/18 1100) Weight:  [62.8 kg] 62.8 kg (05/18 0500)  Recent Labs  Lab 06/24/22 1523 06/24/22 1920 06/24/22 2308 06/25/22 0307 06/25/22 0750  GLUCAP 103* 108* 111* 135* 97    Recent Labs  Lab 06/21/22 0407 06/21/22 0816 06/21/22 2000 06/22/22 0329 06/22/22 0833 06/22/22 1422 06/22/22 2001 06/23/22 0256 06/24/22 0358 06/25/22 0349  NA 159* 162*   < > 160*   < > 159* 158* 155* 154* 153*  K 3.4* 3.5  --  3.9  --   --   --  3.7 3.5 3.6  CL 124*  --   --  127*  --   --   --  120* 119* 116*  CO2 25  --   --  27  --   --   --  26 25 27   GLUCOSE 149*  --   --  116*  --   --   --  110* 96 133*  BUN 21*  --   --  31*  --   --   --  26* 35* 43*  CREATININE 0.87  --   --  0.97  --   --   --  0.84 1.15* 1.64*  CALCIUM 8.7*  --   --  8.8*  --   --   --  8.9 8.6* 8.3*   < > = values in this interval not displayed.    No results for input(s): "AST", "ALT", "ALKPHOS", "BILITOT", "PROT", "ALBUMIN" in the last 168 hours.  Recent Labs  Lab 06/21/22 0407 06/21/22 0816 06/22/22 0329 06/23/22 0256 06/24/22 0358 06/25/22 0349  WBC 14.0*  --  13.5* 15.0* 16.5* 20.7*  HGB 9.2* 9.9* 8.3* 8.6*  8.5* 7.6*  HCT 30.9* 29.0* 28.3* 29.7* 29.5* 26.3*  MCV 73.2*  --  75.1* 75.4* 74.3* 75.8*  PLT 298  --  297 305 351 354    No results for input(s): "CKTOTAL", "CKMB", "CKMBINDEX", "TROPONINI" in the last 168 hours.  No results for input(s): "LABPROT", "INR" in the last 72 hours. No results for input(s): "COLORURINE", "LABSPEC", "PHURINE", "GLUCOSEU", "HGBUR", "BILIRUBINUR", "KETONESUR", "PROTEINUR", "UROBILINOGEN", "NITRITE", "LEUKOCYTESUR" in the last 72 hours.  Invalid input(s): "APPERANCEUR"     Component Value Date/Time   CHOL 126 06/18/2022 0242   TRIG 123 06/21/2022 0407   HDL 45 06/18/2022 0242   CHOLHDL 2.8 06/18/2022 0242   VLDL 16 06/18/2022 0242   LDLCALC 65 06/18/2022 0242   Lab Results  Component Value Date   HGBA1C 5.9 (H) 06/18/2022      Component Value Date/Time   LABOPIA NONE DETECTED 06/17/2022 0206   COCAINSCRNUR POSITIVE (A) 06/17/2022 0206   LABBENZ NONE DETECTED 06/17/2022  0206   AMPHETMU NONE DETECTED 06/17/2022 0206   THCU NONE DETECTED 06/17/2022 0206   LABBARB NONE DETECTED 06/17/2022 0206    No results for input(s): "ETH" in the last 168 hours.   I have personally reviewed the radiological images below and agree with the radiology interpretations.  DG Abd 1 View  Result Date: 06/24/2022 CLINICAL DATA:  OG tube placement EXAM: ABDOMEN - 1 VIEW COMPARISON:  06/24/2022 FINDINGS: OG tube is in place with the tip in the fundus of the stomach. Left basilar chest tube remains in place. Left base atelectasis. IMPRESSION: OG tube tip in the fundus of the stomach. Electronically Signed   By: Charlett Nose M.D.   On: 06/24/2022 03:43   DG Abd 1 View  Result Date: 06/24/2022 CLINICAL DATA:  [id number] Encounter for imaging study to confirm orogastric (OG) tube placement [id number] EXAM: ABDOMEN - 1 VIEW COMPARISON:  X-ray abdomen 06/18/2022 FINDINGS: Enteric tube with tip and side port overlying the expected location of the gastric lumen. Left upper lobe pigtail.  Gaseous distension of the large bowel. No radio-opaque calculi or other significant radiographic abnormality are seen. IMPRESSION: Enteric tube in good position. Electronically Signed   By: Tish Frederickson M.D.   On: 06/24/2022 02:06   DG CHEST PORT 1 VIEW  Result Date: 06/23/2022 CLINICAL DATA:  [id number] Pneumothorax [id number] EXAM: PORTABLE CHEST 1 VIEW COMPARISON:  Jun 21, 2022 FINDINGS: The cardiomediastinal silhouette is unchanged in contour.LEFT-sided chest tube. LEFT neck CVC tip terminates over the SVC. ETT tip terminates 6.7 cm above the carina. Enteric tube tip and side port project over the esophagus. Favored trace LEFT pleural effusion. No significant pneumothorax. Bibasilar atelectasis. IMPRESSION: 1. Favored trace LEFT pleural effusion. No significant pneumothorax. 2. Enteric tube tip and side port project over the esophagus. Recommend advancement. Electronically Signed   By: Meda Klinefelter M.D.   On: 06/23/2022 14:00   DG Chest Port 1 View  Result Date: 06/21/2022 CLINICAL DATA:  Left pneumothorax EXAM: PORTABLE CHEST 1 VIEW COMPARISON:  Previous studies including the examination done earlier today FINDINGS: There is interval decrease in size of small left apical pneumothorax. There is tiny residual pneumothorax in the medial left apex in the current study. Cardiac size is within normal limits. There are no signs of pulmonary edema or new focal infiltrates. Costophrenic angles are clear. Tip of left chest tube is seen in the medial left lower lung field. Tip of NG tube is seen in the fundus of the stomach. Tip of left IJ central venous catheter is seen in superior vena cava. Tip of endotracheal tube is 5.5 cm above the carina. IMPRESSION: There is interval decrease in size of left pneumothorax with tiny residual pneumothorax in the medial left apex. There are no new infiltrates or signs of pulmonary edema. Electronically Signed   By: Ernie Avena M.D.   On: 06/21/2022 13:24   DG CHEST  PORT 1 VIEW  Result Date: 06/21/2022 CLINICAL DATA:  Pneumothorax EXAM: PORTABLE CHEST 1 VIEW COMPARISON:  CXR 06/20/22 FINDINGS: Left-sided pleural pigtail drainage catheter in place with unchanged positioning. There is a small left apical pneumothorax, likely decreased in size compared prior exam. Left-sided central venous catheter with unchanged positioning. Enteric tube courses below diaphragm with the tip out of the field of view. Endotracheal tube terminates approximately 5 cm above the carina. No pleural effusion. No focal airspace opacity. Unchanged cardiac and mediastinal contours. No radiographically apparent new displaced rib fractures. Visualized upper abdomen is unremarkable. IMPRESSION:  Small left apical pneumothorax, likely decreased in size compared to prior exam. Thoracostomy tube in place. Electronically Signed   By: Lorenza Cambridge M.D.   On: 06/21/2022 10:01   DG Chest Port 1 View  Result Date: 06/20/2022 CLINICAL DATA:  Chest tube placement. EXAM: PORTABLE CHEST 1 VIEW COMPARISON:  One-view chest x-ray 06/20/2022 at 12:25 p.m. FINDINGS: The heart is enlarged. Endotracheal tube is stable. Left IJ catheter is stable. Gastric tube terminates in the fundus the stomach. A left sided pleural pigtail catheter was placed. The pneumothorax is reduced but not eliminated. IMPRESSION: 1. Interval placement of left-sided pleural pigtail catheter with reduction in left-sided pneumothorax. 2. Stable cardiomegaly without failure. Electronically Signed   By: Marin Roberts M.D.   On: 06/20/2022 17:50   DG CHEST PORT 1 VIEW  Addendum Date: 06/20/2022   ADDENDUM REPORT: 06/20/2022 16:05 ADDENDUM: Critical Value/emergent results were called by telephone at the time of interpretation on 06/20/2022 at 4:03 pm to provider Priscella Mann, RN, who verbally acknowledged these results. Electronically Signed   By: Signa Kell M.D.   On: 06/20/2022 16:05   Result Date: 06/20/2022 CLINICAL DATA:  Hypoxia.  EXAM: PORTABLE CHEST 1 VIEW COMPARISON:  06/18/2018 for FINDINGS: ETT tip is stable above the carina. There is an enteric tube with tip coursing below the field of view. Left IJ catheter tip is in the distal SVC. There is a new moderate left-sided pneumothorax overlying the apex and lateral basal left lung. No signs of pleural effusion or edema. Persistent opacities within the right middle lobe. IMPRESSION: 1. New moderate left-sided pneumothorax. 2. Stable support apparatus. 3. Persistent right middle lobe opacities. Electronically Signed: By: Signa Kell M.D. On: 06/20/2022 15:41   CT HEAD WO CONTRAST ( )  Result Date: 06/20/2022 CLINICAL DATA:  Neuro deficit with acute stroke suspected EXAM: CT HEAD WITHOUT CONTRAST TECHNIQUE: Contiguous axial images were obtained from the base of the skull through the vertex without intravenous contrast. RADIATION DOSE REDUCTION: This exam was performed according to the departmental dose-optimization program which includes automated exposure control, adjustment of the mA and/or kV according to patient size and/or use of iterative reconstruction technique. COMPARISON:  Yesterday FINDINGS: Brain: Large ICH centered at the right basal ganglia and adjacent white matter, with tracking towards the right brainstem is unchanged in shape and size, measuring up to 5 cm anterior to posterior and 4 cm craniocaudal. Regional edema is unchanged. Extensive cortical infarct in the right posterior frontal and parietal lobe. Smaller but more apparent left parietal cortex infarcts since brain MRI yesterday. Midline shift is 5 mm. Vascular: No hyperdense vessel or unexpected calcification. Skull: Normal. Negative for fracture or focal lesion. Sinuses/Orbits: No acute finding. IMPRESSION: 1. Multiple infarcts by brain MRI yesterday, suspect progressive cortex infarction in the left parietal lobe. 2. Unchanged appearance of right ICH with adjacent edema. Midline shift is 5 mm. Electronically  Signed   By: Tiburcio Pea M.D.   On: 06/20/2022 06:09   CT HEAD WO CONTRAST ( )  Result Date: 06/19/2022 CLINICAL DATA:  Follow-up ICH EXAM: CT HEAD WITHOUT CONTRAST TECHNIQUE: Contiguous axial images were obtained from the base of the skull through the vertex without intravenous contrast. RADIATION DOSE REDUCTION: This exam was performed according to the departmental dose-optimization program which includes automated exposure control, adjustment of the mA and/or kV according to patient size and/or use of iterative reconstruction technique. COMPARISON:  MRI from earlier today. FINDINGS: Brain: Large acute hemorrhage centered at the right basal ganglia and  adjacent white matter without progression in size or shape when compared to prior. Cytotoxic edema most apparent in the right frontal parietal cortex, infarcts being under appreciated when compared to preceding brain MRI. Diffuse effacement of subarachnoid spaces, leftward midline shift of 5 mm. Unchanged mild dilatation of the left lateral ventricle. Vascular: No hyperdense vessel or unexpected calcification. Skull: Normal. Negative for fracture or focal lesion. Sinuses/Orbits: No acute finding. IMPRESSION: Right ICH and extensive cortical infarction that is stable from brain MRI earlier today. No new abnormality. Leftward midline shift of 5 mm. Electronically Signed   By: Tiburcio Pea M.D.   On: 06/19/2022 11:13   MR BRAIN W WO CONTRAST  Result Date: 06/19/2022 CLINICAL DATA:  Hemorrhagic stroke workup EXAM: MRI HEAD WITHOUT AND WITH CONTRAST TECHNIQUE: Multiplanar, multiecho pulse sequences of the brain and surrounding structures were obtained without and with intravenous contrast. CONTRAST:  6mL GADAVIST GADOBUTROL 1 MMOL/ML IV SOLN COMPARISON:  Head CT and CTA from 2 days prior FINDINGS: Brain: Multiple areas of restricted diffusion most confluent in the right parietal and posterior temporal cortex. Restricted diffusion is seen surrounding the  large and known right basal ganglia hemorrhage which measures up to 5.3 cm anterior to posterior with cleft of fluid surrounding the hematoma. Infarcts in other arterial distributions are also present, including along the left cerebral convexity (especially parietooccipital)and at the right brainstem. Midline shift measures 5 mm. There is periventricular FLAIR hyperintensity suggesting chronic small vessel disease. There is asymmetric mild dilatation of the left temporal horn. Vascular: Filling defect in the right sigmoid sinus and upper internal jugular vein. Skull and upper cervical spine: Normal marrow signal Sinuses/Orbits: Mild mucosal thickening in the mastoid air cells. Retention cysts in the right maxillary sinus. Diffuse mucosal thickening in the paranasal sinuses. Negative orbits. IMPRESSION: 1. 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 The most confluent area is in the right MCA territory with infarct likely underlying the right basal ganglia hematoma. 2. 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 The pattern of infarcts and hemorrhage do not correlate with the clot location however. 3. Mild dilatation of the temporal horn of the left lateral ventricle. 5 mm of midline shift. Electronically Signed   By: Tiburcio Pea M.D.   On: 06/19/2022 06:59   DG Abd 1 View  Result Date: 06/18/2022 CLINICAL DATA:  Check gastric catheter placement EXAM: ABDOMEN - 1 VIEW COMPARISON:  Film from earlier in the same day. FINDINGS: Gastric catheter has been advanced and now lies completely within the stomach. Scattered large and small bowel gas is noted. IMPRESSION: Gastric catheter within the stomach as described. Electronically Signed   By: Alcide Clever M.D.   On: 06/18/2022 23:47   DG Abd 1 View  Result Date: 06/18/2022 CLINICAL DATA:  Feeding tube placement. EXAM: ABDOMEN - 1 VIEW COMPARISON:  Jun 18, 2022  (6:44 p.m.) FINDINGS: A nasogastric tube is seen with its distal tip overlying the expected region of the body of the stomach. The distal side hole sits at the level of the gastroesophageal junction. The bowel gas pattern is normal. No radio-opaque calculi or other significant radiographic abnormality are seen. IMPRESSION: Nasogastric tube positioning, as described above. Further advancement of the NG tube by approximately 7 cm is recommended to decrease the risk of aspiration. Electronically Signed   By: Aram Candela M.D.   On: 06/18/2022 22:23   DG Abd Portable 1V  Result Date:  06/18/2022 CLINICAL DATA:  Check gastric catheter placement EXAM: PORTABLE ABDOMEN - 1 VIEW COMPARISON:  None Available. FINDINGS: Scattered large and small bowel gas is noted. Gastric catheter is noted with the tip in the stomach. Proximal side port lies in the distal esophagus. This should be advanced several cm deeper into the stomach. IMPRESSION: Gastric catheter as described. This should be advanced deeper into the stomach. Electronically Signed   By: Alcide Clever M.D.   On: 06/18/2022 21:24   DG CHEST PORT 1 VIEW  Result Date: 06/18/2022 CLINICAL DATA:  Evaluate central line placement EXAM: PORTABLE CHEST 1 VIEW COMPARISON:  Jun 18, 2022 FINDINGS: The side port of the NG tube is near the GE junction. The distal tip is in the stomach. The ETT is in good position. A left central line is been placed in the interval terminating in the central SVC. No pneumothorax. The left lung is clear. Right middle lobe infiltrate remains. No other interval changes. IMPRESSION: 1. Support apparatus as above. The side port of the NG tube is near the GE junction. Recommend advancing the tube. 2. The left lung is clear. Right middle lobe infiltrate remains. 3. No other interval changes. Electronically Signed   By: Gerome Sam III M.D.   On: 06/18/2022 17:20   Overnight EEG with video  Result Date: 06/18/2022 Charlsie Quest, MD      06/19/2022  9:37 AM Patient Name: Rebecca [name] MRN: [id number] Epilepsy Attending: Charlsie Quest Referring Physician/Provider: Erick Blinks, MD Duration: 06/17/2022 1215 to 06/18/2022 1215 Patient history: 47 y.o. female with hx of cocaine use p/w unresponsive and large R IPH with brain compression and 5mm midline shift. EEG to evaluate for seizure Level of alertness: Awake, asleep AEDs during EEG study: None Technical aspects: This EEG study was done with scalp electrodes positioned according to the 10-20 International system of electrode placement. Electrical activity was reviewed with band pass filter of 1-70Hz , sensitivity of 7 uV/mm, display speed of 83mm/sec with a 60Hz  notched filter applied as appropriate. EEG data were recorded continuously and digitally stored.  Video monitoring was available and reviewed as appropriate. Description: The posterior dominant rhythm consists of 7.5 Hz activity of moderate voltage (25-35 uV) seen predominantly in posterior head regions, symmetric and reactive to eye opening and eye closing. Sleep was characterized by vertex waves, sleep spindles (12 to 14 Hz), maximal frontocentral region. EEG showed continuous 3 to 6 Hz theta-delta slowing in right hemisphere, maximal right temporal region. Intermittent generalized 3-6hz  theta-delta slowing was also noted. Hyperventilation and photic stimulation were not performed.   ABNORMALITY - Continuous slow, right hemisphere, maximal right temporal region - Intermittent slow, generalized IMPRESSION: This study is suggestive of cortical dysfunction arising from right hemisphere, maximal right temporal region likely secondary to underlying structural abnormality. Additionally there is mild to moderate diffuse encephalopathy. No seizures or epileptiform discharges were seen throughout the recording. Charlsie Quest   DG Chest Port 1 View  Result Date: 06/18/2022 CLINICAL DATA:  Pulmonary infiltrates EXAM: PORTABLE CHEST 1  VIEW COMPARISON:  06/17/2022 FINDINGS: Endotracheal tube is in stable position. NG tube tip in the proximal stomach with the side port near the GE junction. The NG tube appears to be buckled/looped within the neck near the thoracic inlet. Heart mediastinal contours within normal limits. Right middle lobe airspace opacity again noted, stable. No confluent opacity on the left. No effusions or acute bony abnormality. IMPRESSION: Continued right middle lobe infiltrate, unchanged. NG tube appears to  be buckled slashed looped in the upper esophagus near the thoracic inlet. Tip is in the proximal stomach. Electronically Signed   By: Charlett Nose M.D.   On: 06/18/2022 07:11   DG Abd 1 View  Result Date: 06/17/2022 CLINICAL DATA:  Nasogastric tube placement. EXAM: ABDOMEN - 1 VIEW COMPARISON:  Radiograph earlier today. FINDINGS: Tip of the enteric tube is below the diaphragm in the stomach, the side port is just at the gastroesophageal junction. Normal upper abdominal bowel gas pattern. IMPRESSION: Tip of the enteric tube below the diaphragm in the stomach, side-port just at the gastroesophageal junction. Electronically Signed   By: Narda Rutherford M.D.   On: 06/17/2022 15:43   ECHOCARDIOGRAM COMPLETE  Result Date: 06/17/2022    ECHOCARDIOGRAM REPORT   Patient Name:   Rebecca [name] Date of Exam: 06/17/2022 Medical Rec #:  [id number]          Height:       65.0 in Accession #:    [id number]         Weight:       140.0 lb Date of Birth:  November 29, 1975          BSA:          1.700 m Patient Age:    46 years           BP:           122/111 mmHg Patient Gender: F                  HR:           102 bpm. Exam Location:  Inpatient Procedure: 2D Echo, Cardiac Doppler and Color Doppler Indications:    Stroke  History:        Patient has no prior history of Echocardiogram examinations.                 ICH, sepsis; Risk Factors:Current Smoker and Substance abuse.  Sonographer:    Wallie Char Referring Phys: [id number] [facility]  Sonographer Comments: Technically challenging study due to limited acoustic windows and echo performed with patient supine and on artificial respirator. IMPRESSIONS  1. Left ventricular ejection fraction, by estimation, is 50 to 55%. The left ventricle has low normal function. The left ventricle has no regional wall motion abnormalities. There is moderate left ventricular hypertrophy. Left ventricular diastolic parameters are consistent with Grade I diastolic dysfunction (impaired relaxation).  2. Right ventricular systolic function is normal. The right ventricular size is normal. There is mildly elevated pulmonary artery systolic pressure.  3. The mitral valve is normal in structure. Trivial mitral valve regurgitation. No evidence of mitral stenosis.  4. The aortic valve is tricuspid. Aortic valve regurgitation is not visualized. No aortic stenosis is present. FINDINGS  Left Ventricle: Left ventricular ejection fraction, by estimation, is 50 to 55%. The left ventricle has low normal function. The left ventricle has no regional wall motion abnormalities. The left ventricular internal cavity size was normal in size. There is moderate left ventricular hypertrophy. Left ventricular diastolic parameters are consistent with Grade I diastolic dysfunction (impaired relaxation). Right Ventricle: The right ventricular size is normal. No increase in right ventricular wall thickness. Right ventricular systolic function is normal. There is mildly elevated pulmonary artery systolic pressure. The tricuspid regurgitant velocity is 2.74  m/s, and with an assumed right atrial pressure of 8 mmHg, the estimated right ventricular systolic pressure is 38.0 mmHg. Left Atrium: Left atrial size was normal in  size. Right Atrium: Right atrial size was normal in size. Pericardium: There is no evidence of pericardial effusion. Mitral Valve: The mitral valve is normal in structure. Trivial mitral valve regurgitation. No evidence of  mitral valve stenosis. MV peak gradient, 5.3 mmHg. The mean mitral valve gradient is 2.0 mmHg. Tricuspid Valve: The tricuspid valve is normal in structure. Tricuspid valve regurgitation is trivial. Aortic Valve: The aortic valve is tricuspid. Aortic valve regurgitation is not visualized. No aortic stenosis is present. Aortic valve mean gradient measures 4.0 mmHg. Aortic valve peak gradient measures 6.2 mmHg. Aortic valve area, by VTI measures 2.39 cm. Pulmonic Valve: The pulmonic valve was normal in structure. Pulmonic valve regurgitation is not visualized. Aorta: The aortic root and ascending aorta are structurally normal, with no evidence of dilitation. IAS/Shunts: The interatrial septum was not well visualized.  LEFT VENTRICLE PLAX 2D LVIDd:         4.50 cm     Diastology LVIDs:         3.30 cm     LV e' medial:    6.63 cm/s LV PW:         1.10 cm     LV E/e' medial:  11.0 LV IVS:        1.00 cm     LV e' lateral:   5.86 cm/s LVOT diam:     1.90 cm     LV E/e' lateral: 12.4 LV SV:         40 LV SV Index:   23 LVOT Area:     2.84 cm  LV Volumes (MOD) LV vol d, MOD A2C: 68.4 ml LV vol d, MOD A4C: 77.5 ml LV vol s, MOD A2C: 33.9 ml LV vol s, MOD A4C: 37.8 ml LV SV MOD A2C:     34.5 ml LV SV MOD A4C:     77.5 ml LV SV MOD BP:      39.2 ml RIGHT VENTRICLE             IVC RV S prime:     13.90 cm/s  IVC diam: 1.50 cm TAPSE (M-mode): 1.6 cm LEFT ATRIUM             Index        RIGHT ATRIUM           Index LA diam:        3.00 cm 1.76 cm/m   RA Area:     10.40 cm LA Vol (A2C):   23.2 ml 13.65 ml/m  RA Volume:   20.40 ml  12.00 ml/m LA Vol (A4C):   19.2 ml 11.29 ml/m LA Biplane Vol: 21.2 ml 12.47 ml/m  AORTIC VALVE AV Area (Vmax):    2.85 cm AV Area (Vmean):   2.41 cm AV Area (VTI):     2.39 cm AV Vmax:           124.00 cm/s AV Vmean:          93.450 cm/s AV VTI:            0.166 m AV Peak Grad:      6.2 mmHg AV Mean Grad:      4.0 mmHg LVOT Vmax:         124.50 cm/s LVOT Vmean:        79.500 cm/s LVOT VTI:           0.140 m LVOT/AV VTI ratio: 0.84  AORTA Ao Root diam: 3.30 cm Ao Asc diam:  2.80 cm MITRAL VALVE               TRICUSPID VALVE MV Area (PHT): 4.06 cm    TR Peak grad:   30.0 mmHg MV Area VTI:   1.72 cm    TR Vmax:        274.00 cm/s MV Peak grad:  5.3 mmHg MV Mean grad:  2.0 mmHg    SHUNTS MV Vmax:       1.15 m/s    Systemic VTI:  0.14 m MV Vmean:      68.2 cm/s   Systemic Diam: 1.90 cm MV Decel Time: 187 msec MV E velocity: 72.90 cm/s MV A velocity: 89.60 cm/s MV E/A ratio:  0.81 Epifanio Lesches MD Electronically signed by Epifanio Lesches MD Signature Date/Time: 06/17/2022/10:47:05 AM    Final    CT VENOGRAM HEAD  Result Date: 06/17/2022 CLINICAL DATA:  Intracranial hemorrhage. EXAM: CT VENOGRAM HEAD TECHNIQUE: Venographic phase images of the brain were obtained following the administration of intravenous contrast. Multiplanar reformats and maximum intensity projections were generated. RADIATION DOSE REDUCTION: This exam was performed according to the departmental dose-optimization program which includes automated exposure control, adjustment of the mA and/or kV according to patient size and/or use of iterative reconstruction technique. CONTRAST:  75mL OMNIPAQUE IOHEXOL 350 MG/ML SOLN COMPARISON:  None Available. FINDINGS: Filling defect of the right transverse sinus and right transverse sigmoid junction which are somewhat large and tubular for arachnoid granulations, although an arachnoid granulation at the left transverse sigmoid junction appears nearly similar. The location would also not explain the acute hemorrhage. No visible cortical or central venous thrombosis. IMPRESSION: No cortical or central venous thrombosis to explain the ICH. There are filling defects at the right transverse and transverse sigmoid dural sinuses which are prominent for arachnoid granulations but not definitive for thrombus. Attention at follow-up imaging. Electronically Signed   By: Tiburcio Pea M.D.   On:  06/17/2022 05:30   CT ANGIO HEAD NECK W WO CM  Result Date: 06/17/2022 CLINICAL DATA:  Found unresponsive. Intracranial hemorrhage. History of cocaine use EXAM: CT ANGIOGRAPHY HEAD AND NECK WITH AND WITHOUT CONTRAST TECHNIQUE: Multidetector CT imaging of the head and neck was performed using the standard protocol during bolus administration of intravenous contrast. Multiplanar CT image reconstructions and MIPs were obtained to evaluate the vascular anatomy. Carotid stenosis measurements (when applicable) are obtained utilizing NASCET criteria, using the distal internal carotid diameter as the denominator. RADIATION DOSE REDUCTION: This exam was performed according to the departmental dose-optimization program which includes automated exposure control, adjustment of the mA and/or kV according to patient size and/or use of iterative reconstruction technique. CONTRAST:  75mL OMNIPAQUE IOHEXOL 350 MG/ML SOLN COMPARISON:  Head CT from earlier today FINDINGS: CTA NECK FINDINGS Aortic arch: Unremarkable Right carotid system: Mixed density plaque at the bifurcation with some low-density plaque bulging into the ICA bulb. No ulceration or flow limiting stenosis Left carotid system: Mainly low-density plaque at the bifurcation without stenosis or ulceration. Vertebral arteries: No proximal subclavian stenosis. The vertebral arteries are somewhat tortuous but smoothly contoured and diffusely patent Skeleton: No acute or aggressive finding. Other neck: No acute finding Upper chest: Mild opacification of dependent lungs which could be atelectasis. Centrilobular emphysema. Review of the MIP images confirms the above findings CTA HEAD FINDINGS Anterior circulation: No aneurysm or spot sign seen underlying the ICH. There is a aneurysm projecting leftward from the left carotid terminus which measures 4 mm. No adjacent hemorrhage. No major  branch occlusion. There is undulation of the bilateral intracranial branches which is likely  atheromatous given findings in the neck. No segmental beading. Some less intense flow and right MCA branches is likely related to mass effect by the ICH. Posterior circulation: The vertebral and basilar arteries arediffusely patent. Undulation of the left more than right PCA with up to moderate narrowing on the left, likely atheromatous given the findings in the neck. Venous sinuses: Reference dedicated CT venogram Anatomic variants: No acute finding Review of the MIP images confirms the above findings IMPRESSION: 1. No vascular lesion or spot sign seen at the acute ICH. 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 3. 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 Electronically Signed   By: Tiburcio Pea M.D.   On: 06/17/2022 05:18   CT Head Wo Contrast  Result Date: 06/17/2022 CLINICAL DATA:  Altered mental status EXAM: CT HEAD WITHOUT CONTRAST TECHNIQUE: Contiguous axial images were obtained from the base of the skull through the vertex without intravenous contrast. RADIATION DOSE REDUCTION: This exam was performed according to the departmental dose-optimization program which includes automated exposure control, adjustment of the mA and/or kV according to patient size and/or use of iterative reconstruction technique. COMPARISON:  None Available. FINDINGS: Brain: There is a large area of parenchymal hemorrhage identified in the right cerebral hemisphere centered primarily within the basal ganglia and extending superiorly into the centrum semi ovale. This measures approximately 4.7 x 4.0 cm in greatest AP and transverse dimensions respectively. It extends for approximately 4.7 cm in craniocaudad dimension. Significant mass-effect upon the right lateral ventricle is noted. Mild midline shift of approximately 5 mm is noted. Some surrounding edema is seen as well. No other hemorrhage is noted. Relative loss of the sulcal markings is noted likely related to some increased intracranial  pressure. Vascular: No hyperdense vessel or unexpected calcification. Skull: Normal. Negative for fracture or focal lesion. Sinuses/Orbits: No acute finding. Other: None. IMPRESSION: Right-sided intraparenchymal hemorrhage as described with associated edema and mild midline shift of 5 mm from right to left. Critical Value/emergent results were called by telephone at the time of interpretation on 06/17/2022 at 2:27 am to Dr. Donna Bernard , who verbally acknowledged these results. Electronically Signed   By: Alcide Clever M.D.   On: 06/17/2022 02:29   DG Abdomen 1 View  Result Date: 06/17/2022 CLINICAL DATA:  Check gastric catheter placement EXAM: ABDOMEN - 1 VIEW COMPARISON:  None Available. FINDINGS: Gastric catheter is noted extending into the stomach. Proximal side port lies at the gastroesophageal junction. This should be advanced further into the stomach. No free air is seen. No bony abnormality is noted. IMPRESSION: Gastric catheter as described. Electronically Signed   By: Alcide Clever M.D.   On: 06/17/2022 02:16   DG Chest Port 1 View  Result Date: 06/17/2022 CLINICAL DATA:  Check endotracheal tube placement EXAM: PORTABLE CHEST 1 VIEW COMPARISON:  10/21/2020 FINDINGS: Cardiac shadow is within normal limits. Endotracheal tube is noted 1.5 cm above the carina. Gastric catheter is noted extending into the stomach. Patchy increased density is noted in the right middle lobe beneath the minor fissure consistent with acute infiltrate. No sizable effusion is noted. No bony abnormality is seen. IMPRESSION: Right middle lobe infiltrate. Tubes and lines as described. Electronically Signed   By: Alcide Clever M.D.   On: 06/17/2022 02:16     PHYSICAL EXAM  Temp:  [97.2 F (36.2 C)-103.4 F (39.7 C)] 97.2 F (36.2 C) (05/18 1100) Pulse Rate:  [  78-122] 78 (05/18 1100) Resp:  [18-36] 35 (05/18 1100) BP: (89-131)/(59-87) 109/64 (05/18 1100) SpO2:  [90 %-100 %] 96 % (05/18 1100) FiO2 (%):  [40 %] 40 % (05/18  1100) Weight:  [62.8 kg] 62.8 kg (05/18 0500)   General: Patient on fentanyl at stopped prior to examination.  She is intubated and on mechanical ventilation. Neuro: Not responding to commands.  Does not open eyes to command, unable to nod yes or no to questions.  Does not open eyes to noxious stimuli.  Pupils are 2 mm and sluggish blink to threat on the right and on the left.  Does not track examiner.  Oculocephalic intact.  Gag cough intact.  Corneals positive on the right absent on the left.  No movement in the left arm or leg.  She withdraws slightly in the right arm and leg to painful stimuli.  She was not able to grip my fingers or consistently follow commands on the right side.    ASSESSMENT/PLAN Rebecca [name] is a 47 y.o. female with history of cocaine abuse admitted for unresponsiveness.  Intubated in ER for airway protection.  No tPA given due to ICH.    ICH:  right BG and CR ICH, still more likely due to cocaine induced hypertension vs. 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 Vasospasm from ICH vs. 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  2D Echo EF 50 to 55% LDL 65 HgbA1c 5.9 UDS positive for cocaine Heparin subcu for VTE prophylaxis No  antithrombotic prior to admission, now on No antithrombotic due to ICH. Therapy recommendations: Pending Disposition: Pending, prognosis poor, palliative care on board. Family meeting to be held alongside palliative care today at 1PM.   Cerebral edema CT right BG and CR large ICH, 5 mm midline shift CT repeat 5/12 stable ICH with leftward midline shift of 5 mm CT repeat 5/13 unchanged appearance of right ICH with adjacent edema. Midline shift is 5 mm. Na 151->155->157->159-> 155->158->150->159->162->161->159->158->155 -> 154>153 off 3% saline Na monitoring LTM EEG cortical dysfunction arising from right hemisphere with diffuse encephalopathy, no seizures  Respiratory failure Aspiration pneumonia Leukocytosis Pneumothorax  Intubated on vent Still on fentanyl, low dose CCM on board CXR right middle lobe infiltrate On azithromycin and Rocephin -> unasyn and zyvox Tmax 100.6->100.3->afebrile->100.3 -> 101.9 WBC 22.2->20.1->15.8->14.0->13.5->15.0 -> 16.5>20 S/p chest tube  Hypertension BP unstable Off cleviprex Labetalol and hydralazine IV PRN BP goal < 160 Long term BP goal normotensive Due to increased creatinine we will hold off on lisinopril for now.  Can use as needed to control blood pressure.  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  Hospital day # 8  Essentially no change in exam overall poor prognosis.  Overnight has a fever blood culture sent on antibiotics.  Appreciate CCM assistance.  Discussed with ICU nurse as well as  palliative care.  Meeting today at 1 PM to discuss with family.  This patient is critically ill due to large ICH, stroke, dural venous sinus thrombosis, cerebral edema, cocaine abuse, AKI, pneumonia, UTI and at significant risk of neurological worsening, death form herniation, brain death, seizure, sepsis. This patient's care requires constant monitoring of vital signs, hemodynamics, respiratory and cardiac monitoring, review of multiple databases, neurological assessment, discussion with family, other specialists and medical decision making of high complexity. I spent 35 minutes of neurocritical care time in the care of this patient.  To contact Stroke Continuity provider, please refer to [email or web address]. After hours, contact General Neurology

## 2022-06-26 DIAGNOSIS — J9601 Acute respiratory failure with hypoxia: Secondary | ICD-10-CM | POA: Diagnosis not present

## 2022-06-26 DIAGNOSIS — I610 Nontraumatic intracerebral hemorrhage in hemisphere, subcortical: Secondary | ICD-10-CM | POA: Diagnosis not present

## 2022-06-26 DIAGNOSIS — Z515 Encounter for palliative care: Secondary | ICD-10-CM | POA: Diagnosis not present

## 2022-06-26 LAB — BASIC METABOLIC PANEL
Anion gap: 8 (ref 5–15)
BUN: 33 mg/dL — ABNORMAL HIGH (ref 6–20)
CO2: 26 mmol/L (ref 22–32)
Calcium: 8.6 mg/dL — ABNORMAL LOW (ref 8.9–10.3)
Chloride: 120 mmol/L — ABNORMAL HIGH (ref 98–111)
Creatinine, Ser: 1 mg/dL (ref 0.44–1.00)
GFR, Estimated: >60 mL/min (ref >60)
Glucose, Bld: 120 mg/dL — ABNORMAL HIGH (ref 70–99)
Potassium: 3.5 mmol/L (ref 3.5–5.1)
Sodium: 154 mmol/L — ABNORMAL HIGH (ref 135–145)

## 2022-06-26 LAB — GLUCOSE, CAPILLARY: Glucose-Capillary: 112 mg/dL — ABNORMAL HIGH (ref 70–99)

## 2022-06-26 NOTE — Progress Notes (Signed)
   Palliative Medicine Inpatient Follow Up Note   Received a call on VM that patients daughter, [name] who has spoken to family. Patients family plan to be here Sunday at 10:30 AM. They all feel that patient may have a "fighting chance". [name] has requested that patient be reverted back to full code.   We will be available for further conversations throughout the day.  No Charge ______________________________________________________________________________________ Lamarr Lulas [facility] Team Team Cell Phone: [phone] Please utilize secure chat with additional questions, if there is no response within 30 minutes please call the above phone number  Palliative Medicine Team providers are available by phone from 7am to 7pm daily and can be reached through the team cell phone.  Should this patient require assistance outside of these hours, please call the patient's attending physician.

## 2022-06-26 NOTE — Progress Notes (Signed)
STROKE TEAM PROGRESS NOTE   SUBJECTIVE (INTERVAL HISTORY)  Overall doing about the same from a neurostandpoint.  Long discussion with family yesterday.  Today more family showed up Continue full measures.  Continues to be intubated requiring mechanical ventilation.  On antibiotics linezolid for pneumonia.   OBJECTIVE Temp:  [97.7 F (36.5 C)-99.9 F (37.7 C)] 99 F (37.2 C) (05/19 1518) Pulse Rate:  [74-113] 95 (05/19 1550) Cardiac Rhythm: Normal sinus rhythm (05/19 0800) Resp:  [18-32] 26 (05/19 1550) BP: (99-189)/(61-90) 136/81 (05/19 1400) SpO2:  [93 %-100 %] 97 % (05/19 1550) FiO2 (%):  [40 %] 40 % (05/19 1550)  Recent Labs  Lab 06/24/22 2308 06/25/22 0307 06/25/22 0750 06/25/22 1151 06/25/22 1530  GLUCAP 111* 135* 97 87 102*    Recent Labs  Lab 06/22/22 0329 06/22/22 3244 06/22/22 2001 06/23/22 0256 06/24/22 0358 06/25/22 0349 06/26/22 0955  NA 160*   < > 158* 155* 154* 153* 154*  K 3.9  --   --  3.7 3.5 3.6 3.5  CL 127*  --   --  120* 119* 116* 120*  CO2 27  --   --  26 25 27 26   GLUCOSE 116*  --   --  110* 96 133* 120*  BUN 31*  --   --  26* 35* 43* 33*  CREATININE 0.97  --   --  0.84 1.15* 1.64* 1.00  CALCIUM 8.8*  --   --  8.9 8.6* 8.3* 8.6*   < > = values in this interval not displayed.    No results for input(s): "AST", "ALT", "ALKPHOS", "BILITOT", "PROT", "ALBUMIN" in the last 168 hours.  Recent Labs  Lab 06/21/22 0407 06/21/22 0816 06/22/22 0329 06/23/22 0256 06/24/22 0358 06/25/22 0349  WBC 14.0*  --  13.5* 15.0* 16.5* 20.7*  HGB 9.2* 9.9* 8.3* 8.6* 8.5* 7.6*  HCT 30.9* 29.0* 28.3* 29.7* 29.5* 26.3*  MCV 73.2*  --  75.1* 75.4* 74.3* 75.8*  PLT 298  --  297 305 351 354    No results for input(s): "CKTOTAL", "CKMB", "CKMBINDEX", "TROPONINI" in the last 168 hours.  No results for input(s): "LABPROT", "INR" in the last 72 hours. No results for input(s): "COLORURINE", "LABSPEC", "PHURINE", "GLUCOSEU", "HGBUR", "BILIRUBINUR",  "KETONESUR", "PROTEINUR", "UROBILINOGEN", "NITRITE", "LEUKOCYTESUR" in the last 72 hours.  Invalid input(s): "APPERANCEUR"     Component Value Date/Time   CHOL 126 06/18/2022 0242   TRIG 123 06/21/2022 0407   HDL 45 06/18/2022 0242   CHOLHDL 2.8 06/18/2022 0242   VLDL 16 06/18/2022 0242   LDLCALC 65 06/18/2022 0242   Lab Results  Component Value Date   HGBA1C 5.9 (H) 06/18/2022      Component Value Date/Time   LABOPIA NONE DETECTED 06/17/2022 0206   COCAINSCRNUR POSITIVE (A) 06/17/2022 0206   LABBENZ NONE DETECTED 06/17/2022 0206   AMPHETMU NONE DETECTED 06/17/2022 0206   THCU NONE DETECTED 06/17/2022 0206   LABBARB NONE DETECTED 06/17/2022 0206    No results for input(s): "ETH" in the last 168 hours.   DG Abd 1 View  Result Date: 06/24/2022 CLINICAL DATA:  OG tube placement EXAM: ABDOMEN - 1 VIEW COMPARISON:  06/24/2022 FINDINGS: OG tube is in place with the tip in the fundus of the stomach. Left basilar chest tube remains in place. Left base atelectasis. IMPRESSION: OG tube tip in the fundus of the stomach. Electronically Signed   By: Charlett Nose M.D.   On: 06/24/2022 03:43   DG Abd 1 View  Result  Date: 06/24/2022 CLINICAL DATA:  [id number] Encounter for imaging study to confirm orogastric (OG) tube placement [id number] EXAM: ABDOMEN - 1 VIEW COMPARISON:  X-ray abdomen 06/18/2022 FINDINGS: Enteric tube with tip and side port overlying the expected location of the gastric lumen. Left upper lobe pigtail. Gaseous distension of the large bowel. No radio-opaque calculi or other significant radiographic abnormality are seen. IMPRESSION: Enteric tube in good position. Electronically Signed   By: Tish Frederickson M.D.   On: 06/24/2022 02:06   DG CHEST PORT 1 VIEW  Result Date: 06/23/2022 CLINICAL DATA:  [id number] Pneumothorax [id number] EXAM: PORTABLE CHEST 1 VIEW COMPARISON:  Jun 21, 2022 FINDINGS: The cardiomediastinal silhouette is unchanged in contour.LEFT-sided chest tube. LEFT neck CVC tip  terminates over the SVC. ETT tip terminates 6.7 cm above the carina. Enteric tube tip and side port project over the esophagus. Favored trace LEFT pleural effusion. No significant pneumothorax. Bibasilar atelectasis. IMPRESSION: 1. Favored trace LEFT pleural effusion. No significant pneumothorax. 2. Enteric tube tip and side port project over the esophagus. Recommend advancement. Electronically Signed   By: Meda Klinefelter M.D.   On: 06/23/2022 14:00   DG Chest Port 1 View  Result Date: 06/21/2022 CLINICAL DATA:  Left pneumothorax EXAM: PORTABLE CHEST 1 VIEW COMPARISON:  Previous studies including the examination done earlier today FINDINGS: There is interval decrease in size of small left apical pneumothorax. There is tiny residual pneumothorax in the medial left apex in the current study. Cardiac size is within normal limits. There are no signs of pulmonary edema or new focal infiltrates. Costophrenic angles are clear. Tip of left chest tube is seen in the medial left lower lung field. Tip of NG tube is seen in the fundus of the stomach. Tip of left IJ central venous catheter is seen in superior vena cava. Tip of endotracheal tube is 5.5 cm above the carina. IMPRESSION: There is interval decrease in size of left pneumothorax with tiny residual pneumothorax in the medial left apex. There are no new infiltrates or signs of pulmonary edema. Electronically Signed   By: Ernie Avena M.D.   On: 06/21/2022 13:24   DG CHEST PORT 1 VIEW  Result Date: 06/21/2022 CLINICAL DATA:  Pneumothorax EXAM: PORTABLE CHEST 1 VIEW COMPARISON:  CXR 06/20/22 FINDINGS: Left-sided pleural pigtail drainage catheter in place with unchanged positioning. There is a small left apical pneumothorax, likely decreased in size compared prior exam. Left-sided central venous catheter with unchanged positioning. Enteric tube courses below diaphragm with the tip out of the field of view. Endotracheal tube terminates approximately 5 cm  above the carina. No pleural effusion. No focal airspace opacity. Unchanged cardiac and mediastinal contours. No radiographically apparent new displaced rib fractures. Visualized upper abdomen is unremarkable. IMPRESSION: Small left apical pneumothorax, likely decreased in size compared to prior exam. Thoracostomy tube in place. Electronically Signed   By: Lorenza Cambridge M.D.   On: 06/21/2022 10:01   DG Chest Port 1 View  Result Date: 06/20/2022 CLINICAL DATA:  Chest tube placement. EXAM: PORTABLE CHEST 1 VIEW COMPARISON:  One-view chest x-ray 06/20/2022 at 12:25 p.m. FINDINGS: The heart is enlarged. Endotracheal tube is stable. Left IJ catheter is stable. Gastric tube terminates in the fundus the stomach. A left sided pleural pigtail catheter was placed. The pneumothorax is reduced but not eliminated. IMPRESSION: 1. Interval placement of left-sided pleural pigtail catheter with reduction in left-sided pneumothorax. 2. Stable cardiomegaly without failure. Electronically Signed   By: Marin Roberts M.D.   On: 06/20/2022 17:50  DG CHEST PORT 1 VIEW  Addendum Date: 06/20/2022   ADDENDUM REPORT: 06/20/2022 16:05 ADDENDUM: Critical Value/emergent results were called by telephone at the time of interpretation on 06/20/2022 at 4:03 pm to provider Priscella Mann, RN, who verbally acknowledged these results. Electronically Signed   By: Signa Kell M.D.   On: 06/20/2022 16:05   Result Date: 06/20/2022 CLINICAL DATA:  Hypoxia. EXAM: PORTABLE CHEST 1 VIEW COMPARISON:  06/18/2018 for FINDINGS: ETT tip is stable above the carina. There is an enteric tube with tip coursing below the field of view. Left IJ catheter tip is in the distal SVC. There is a new moderate left-sided pneumothorax overlying the apex and lateral basal left lung. No signs of pleural effusion or edema. Persistent opacities within the right middle lobe. IMPRESSION: 1. New moderate left-sided pneumothorax. 2. Stable support apparatus. 3.  Persistent right middle lobe opacities. Electronically Signed: By: Signa Kell M.D. On: 06/20/2022 15:41   CT HEAD WO CONTRAST ( )  Result Date: 06/20/2022 CLINICAL DATA:  Neuro deficit with acute stroke suspected EXAM: CT HEAD WITHOUT CONTRAST TECHNIQUE: Contiguous axial images were obtained from the base of the skull through the vertex without intravenous contrast. RADIATION DOSE REDUCTION: This exam was performed according to the departmental dose-optimization program which includes automated exposure control, adjustment of the mA and/or kV according to patient size and/or use of iterative reconstruction technique. COMPARISON:  Yesterday FINDINGS: Brain: Large ICH centered at the right basal ganglia and adjacent white matter, with tracking towards the right brainstem is unchanged in shape and size, measuring up to 5 cm anterior to posterior and 4 cm craniocaudal. Regional edema is unchanged. Extensive cortical infarct in the right posterior frontal and parietal lobe. Smaller but more apparent left parietal cortex infarcts since brain MRI yesterday. Midline shift is 5 mm. Vascular: No hyperdense vessel or unexpected calcification. Skull: Normal. Negative for fracture or focal lesion. Sinuses/Orbits: No acute finding. IMPRESSION: 1. Multiple infarcts by brain MRI yesterday, suspect progressive cortex infarction in the left parietal lobe. 2. Unchanged appearance of right ICH with adjacent edema. Midline shift is 5 mm. Electronically Signed   By: Tiburcio Pea M.D.   On: 06/20/2022 06:09   CT HEAD WO CONTRAST ( )  Result Date: 06/19/2022 CLINICAL DATA:  Follow-up ICH EXAM: CT HEAD WITHOUT CONTRAST TECHNIQUE: Contiguous axial images were obtained from the base of the skull through the vertex without intravenous contrast. RADIATION DOSE REDUCTION: This exam was performed according to the departmental dose-optimization program which includes automated exposure control, adjustment of the mA and/or kV  according to patient size and/or use of iterative reconstruction technique. COMPARISON:  MRI from earlier today. FINDINGS: Brain: Large acute hemorrhage centered at the right basal ganglia and adjacent white matter without progression in size or shape when compared to prior. Cytotoxic edema most apparent in the right frontal parietal cortex, infarcts being under appreciated when compared to preceding brain MRI. Diffuse effacement of subarachnoid spaces, leftward midline shift of 5 mm. Unchanged mild dilatation of the left lateral ventricle. Vascular: No hyperdense vessel or unexpected calcification. Skull: Normal. Negative for fracture or focal lesion. Sinuses/Orbits: No acute finding. IMPRESSION: Right ICH and extensive cortical infarction that is stable from brain MRI earlier today. No new abnormality. Leftward midline shift of 5 mm. Electronically Signed   By: Tiburcio Pea M.D.   On: 06/19/2022 11:13   MR BRAIN W WO CONTRAST  Result Date: 06/19/2022 CLINICAL DATA:  Hemorrhagic stroke workup EXAM: MRI HEAD WITHOUT AND WITH CONTRAST  TECHNIQUE: Multiplanar, multiecho pulse sequences of the brain and surrounding structures were obtained without and with intravenous contrast. CONTRAST:  6mL GADAVIST GADOBUTROL 1 MMOL/ML IV SOLN COMPARISON:  Head CT and CTA from 2 days prior FINDINGS: Brain: Multiple areas of restricted diffusion most confluent in the right parietal and posterior temporal cortex. Restricted diffusion is seen surrounding the large and known right basal ganglia hemorrhage which measures up to 5.3 cm anterior to posterior with cleft of fluid surrounding the hematoma. Infarcts in other arterial distributions are also present, including along the left cerebral convexity (especially parietooccipital)and at the right brainstem. Midline shift measures 5 mm. There is periventricular FLAIR hyperintensity suggesting chronic small vessel disease. There is asymmetric mild dilatation of the left temporal  horn. Vascular: Filling defect in the right sigmoid sinus and upper internal jugular vein. Skull and upper cervical spine: Normal marrow signal Sinuses/Orbits: Mild mucosal thickening in the mastoid air cells. Retention cysts in the right maxillary sinus. Diffuse mucosal thickening in the paranasal sinuses. Negative orbits. IMPRESSION: 1. 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 The most confluent area is in the right MCA territory with infarct likely underlying the right basal ganglia hematoma. 2. 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 The pattern of infarcts and hemorrhage do not correlate with the clot location however. 3. Mild dilatation of the temporal horn of the left lateral ventricle. 5 mm of midline shift. Electronically Signed   By: Tiburcio Pea M.D.   On: 06/19/2022 06:59   DG Abd 1 View  Result Date: 06/18/2022 CLINICAL DATA:  Check gastric catheter placement EXAM: ABDOMEN - 1 VIEW COMPARISON:  Film from earlier in the same day. FINDINGS: Gastric catheter has been advanced and now lies completely within the stomach. Scattered large and small bowel gas is noted. IMPRESSION: Gastric catheter within the stomach as described. Electronically Signed   By: Alcide Clever M.D.   On: 06/18/2022 23:47   DG Abd 1 View  Result Date: 06/18/2022 CLINICAL DATA:  Feeding tube placement. EXAM: ABDOMEN - 1 VIEW COMPARISON:  Jun 18, 2022 (6:44 p.m.) FINDINGS: A nasogastric tube is seen with its distal tip overlying the expected region of the body of the stomach. The distal side hole sits at the level of the gastroesophageal junction. The bowel gas pattern is normal. No radio-opaque calculi or other significant radiographic abnormality are seen. IMPRESSION: Nasogastric tube positioning, as described above. Further advancement of the NG tube by approximately 7 cm is recommended to decrease the risk of  aspiration. Electronically Signed   By: Aram Candela M.D.   On: 06/18/2022 22:23   DG Abd Portable 1V  Result Date: 06/18/2022 CLINICAL DATA:  Check gastric catheter placement EXAM: PORTABLE ABDOMEN - 1 VIEW COMPARISON:  None Available. FINDINGS: Scattered large and small bowel gas is noted. Gastric catheter is noted with the tip in the stomach. Proximal side port lies in the distal esophagus. This should be advanced several cm deeper into the stomach. IMPRESSION: Gastric catheter as described. This should be advanced deeper into the stomach. Electronically Signed   By: Alcide Clever M.D.   On: 06/18/2022 21:24   DG CHEST PORT 1 VIEW  Result Date: 06/18/2022 CLINICAL DATA:  Evaluate central line placement EXAM: PORTABLE CHEST 1 VIEW COMPARISON:  Jun 18, 2022 FINDINGS: The side port of the NG tube is near the GE junction. The distal tip is in the stomach. The ETT is  in good position. A left central line is been placed in the interval terminating in the central SVC. No pneumothorax. The left lung is clear. Right middle lobe infiltrate remains. No other interval changes. IMPRESSION: 1. Support apparatus as above. The side port of the NG tube is near the GE junction. Recommend advancing the tube. 2. The left lung is clear. Right middle lobe infiltrate remains. 3. No other interval changes. Electronically Signed   By: Gerome Sam III M.D.   On: 06/18/2022 17:20   Overnight EEG with video  Result Date: 06/18/2022 Charlsie Quest, MD     06/19/2022  9:37 AM Patient Name: LORRAIN DURU MRN: [id number] Epilepsy Attending: Charlsie Quest Referring Physician/Provider: Erick Blinks, MD Duration: 06/17/2022 1215 to 06/18/2022 1215 Patient history: 47 y.o. female with hx of cocaine use p/w unresponsive and large R IPH with brain compression and 5mm midline shift. EEG to evaluate for seizure Level of alertness: Awake, asleep AEDs during EEG study: None Technical aspects: This EEG study was done with  scalp electrodes positioned according to the 10-20 International system of electrode placement. Electrical activity was reviewed with band pass filter of 1-70Hz , sensitivity of 7 uV/mm, display speed of 67mm/sec with a 60Hz  notched filter applied as appropriate. EEG data were recorded continuously and digitally stored.  Video monitoring was available and reviewed as appropriate. Description: The posterior dominant rhythm consists of 7.5 Hz activity of moderate voltage (25-35 uV) seen predominantly in posterior head regions, symmetric and reactive to eye opening and eye closing. Sleep was characterized by vertex waves, sleep spindles (12 to 14 Hz), maximal frontocentral region. EEG showed continuous 3 to 6 Hz theta-delta slowing in right hemisphere, maximal right temporal region. Intermittent generalized 3-6hz  theta-delta slowing was also noted. Hyperventilation and photic stimulation were not performed.   ABNORMALITY - Continuous slow, right hemisphere, maximal right temporal region - Intermittent slow, generalized IMPRESSION: This study is suggestive of cortical dysfunction arising from right hemisphere, maximal right temporal region likely secondary to underlying structural abnormality. Additionally there is mild to moderate diffuse encephalopathy. No seizures or epileptiform discharges were seen throughout the recording. Charlsie Quest   DG Chest Port 1 View  Result Date: 06/18/2022 CLINICAL DATA:  Pulmonary infiltrates EXAM: PORTABLE CHEST 1 VIEW COMPARISON:  06/17/2022 FINDINGS: Endotracheal tube is in stable position. NG tube tip in the proximal stomach with the side port near the GE junction. The NG tube appears to be buckled/looped within the neck near the thoracic inlet. Heart mediastinal contours within normal limits. Right middle lobe airspace opacity again noted, stable. No confluent opacity on the left. No effusions or acute bony abnormality. IMPRESSION: Continued right middle lobe infiltrate,  unchanged. NG tube appears to be buckled slashed looped in the upper esophagus near the thoracic inlet. Tip is in the proximal stomach. Electronically Signed   By: Charlett Nose M.D.   On: 06/18/2022 07:11   DG Abd 1 View  Result Date: 06/17/2022 CLINICAL DATA:  Nasogastric tube placement. EXAM: ABDOMEN - 1 VIEW COMPARISON:  Radiograph earlier today. FINDINGS: Tip of the enteric tube is below the diaphragm in the stomach, the side port is just at the gastroesophageal junction. Normal upper abdominal bowel gas pattern. IMPRESSION: Tip of the enteric tube below the diaphragm in the stomach, side-port just at the gastroesophageal junction. Electronically Signed   By: Narda Rutherford M.D.   On: 06/17/2022 15:43   ECHOCARDIOGRAM COMPLETE  Result Date: 06/17/2022    ECHOCARDIOGRAM REPORT  Patient Name:   MARCHAE HAGERTY Date of Exam: 06/17/2022 Medical Rec #:  [id number]          Height:       65.0 in Accession #:    [id number]         Weight:       140.0 lb Date of Birth:  05-30-1975          BSA:          1.700 m Patient Age:    46 years           BP:           122/111 mmHg Patient Gender: F                  HR:           102 bpm. Exam Location:  Inpatient Procedure: 2D Echo, Cardiac Doppler and Color Doppler Indications:    Stroke  History:        Patient has no prior history of Echocardiogram examinations.                 ICH, sepsis; Risk Factors:Current Smoker and Substance abuse.  Sonographer:    Wallie Char Referring Phys: [id number] [facility]  Sonographer Comments: Technically challenging study due to limited acoustic windows and echo performed with patient supine and on artificial respirator. IMPRESSIONS  1. Left ventricular ejection fraction, by estimation, is 50 to 55%. The left ventricle has low normal function. The left ventricle has no regional wall motion abnormalities. There is moderate left ventricular hypertrophy. Left ventricular diastolic parameters are consistent with Grade I  diastolic dysfunction (impaired relaxation).  2. Right ventricular systolic function is normal. The right ventricular size is normal. There is mildly elevated pulmonary artery systolic pressure.  3. The mitral valve is normal in structure. Trivial mitral valve regurgitation. No evidence of mitral stenosis.  4. The aortic valve is tricuspid. Aortic valve regurgitation is not visualized. No aortic stenosis is present. FINDINGS  Left Ventricle: Left ventricular ejection fraction, by estimation, is 50 to 55%. The left ventricle has low normal function. The left ventricle has no regional wall motion abnormalities. The left ventricular internal cavity size was normal in size. There is moderate left ventricular hypertrophy. Left ventricular diastolic parameters are consistent with Grade I diastolic dysfunction (impaired relaxation). Right Ventricle: The right ventricular size is normal. No increase in right ventricular wall thickness. Right ventricular systolic function is normal. There is mildly elevated pulmonary artery systolic pressure. The tricuspid regurgitant velocity is 2.74  m/s, and with an assumed right atrial pressure of 8 mmHg, the estimated right ventricular systolic pressure is 38.0 mmHg. Left Atrium: Left atrial size was normal in size. Right Atrium: Right atrial size was normal in size. Pericardium: There is no evidence of pericardial effusion. Mitral Valve: The mitral valve is normal in structure. Trivial mitral valve regurgitation. No evidence of mitral valve stenosis. MV peak gradient, 5.3 mmHg. The mean mitral valve gradient is 2.0 mmHg. Tricuspid Valve: The tricuspid valve is normal in structure. Tricuspid valve regurgitation is trivial. Aortic Valve: The aortic valve is tricuspid. Aortic valve regurgitation is not visualized. No aortic stenosis is present. Aortic valve mean gradient measures 4.0 mmHg. Aortic valve peak gradient measures 6.2 mmHg. Aortic valve area, by VTI measures 2.39 cm. Pulmonic  Valve: The pulmonic valve was normal in structure. Pulmonic valve regurgitation is not visualized. Aorta: The aortic root and ascending aorta are structurally normal, with no evidence  of dilitation. IAS/Shunts: The interatrial septum was not well visualized.  LEFT VENTRICLE PLAX 2D LVIDd:         4.50 cm     Diastology LVIDs:         3.30 cm     LV e' medial:    6.63 cm/s LV PW:         1.10 cm     LV E/e' medial:  11.0 LV IVS:        1.00 cm     LV e' lateral:   5.86 cm/s LVOT diam:     1.90 cm     LV E/e' lateral: 12.4 LV SV:         40 LV SV Index:   23 LVOT Area:     2.84 cm  LV Volumes (MOD) LV vol d, MOD A2C: 68.4 ml LV vol d, MOD A4C: 77.5 ml LV vol s, MOD A2C: 33.9 ml LV vol s, MOD A4C: 37.8 ml LV SV MOD A2C:     34.5 ml LV SV MOD A4C:     77.5 ml LV SV MOD BP:      39.2 ml RIGHT VENTRICLE             IVC RV S prime:     13.90 cm/s  IVC diam: 1.50 cm TAPSE (M-mode): 1.6 cm LEFT ATRIUM             Index        RIGHT ATRIUM           Index LA diam:        3.00 cm 1.76 cm/m   RA Area:     10.40 cm LA Vol (A2C):   23.2 ml 13.65 ml/m  RA Volume:   20.40 ml  12.00 ml/m LA Vol (A4C):   19.2 ml 11.29 ml/m LA Biplane Vol: 21.2 ml 12.47 ml/m  AORTIC VALVE AV Area (Vmax):    2.85 cm AV Area (Vmean):   2.41 cm AV Area (VTI):     2.39 cm AV Vmax:           124.00 cm/s AV Vmean:          93.450 cm/s AV VTI:            0.166 m AV Peak Grad:      6.2 mmHg AV Mean Grad:      4.0 mmHg LVOT Vmax:         124.50 cm/s LVOT Vmean:        79.500 cm/s LVOT VTI:          0.140 m LVOT/AV VTI ratio: 0.84  AORTA Ao Root diam: 3.30 cm Ao Asc diam:  2.80 cm MITRAL VALVE               TRICUSPID VALVE MV Area (PHT): 4.06 cm    TR Peak grad:   30.0 mmHg MV Area VTI:   1.72 cm    TR Vmax:        274.00 cm/s MV Peak grad:  5.3 mmHg MV Mean grad:  2.0 mmHg    SHUNTS MV Vmax:       1.15 m/s    Systemic VTI:  0.14 m MV Vmean:      68.2 cm/s   Systemic Diam: 1.90 cm MV Decel Time: 187 msec MV E velocity: 72.90 cm/s MV A velocity:  89.60 cm/s MV E/A ratio:  0.81 Epifanio Lesches MD Electronically signed by Cristal Deer  Bjorn Pippin MD Signature Date/Time: 06/17/2022/10:47:05 AM    Final    CT VENOGRAM HEAD  Result Date: 06/17/2022 CLINICAL DATA:  Intracranial hemorrhage. EXAM: CT VENOGRAM HEAD TECHNIQUE: Venographic phase images of the brain were obtained following the administration of intravenous contrast. Multiplanar reformats and maximum intensity projections were generated. RADIATION DOSE REDUCTION: This exam was performed according to the departmental dose-optimization program which includes automated exposure control, adjustment of the mA and/or kV according to patient size and/or use of iterative reconstruction technique. CONTRAST:  75mL OMNIPAQUE IOHEXOL 350 MG/ML SOLN COMPARISON:  None Available. FINDINGS: Filling defect of the right transverse sinus and right transverse sigmoid junction which are somewhat large and tubular for arachnoid granulations, although an arachnoid granulation at the left transverse sigmoid junction appears nearly similar. The location would also not explain the acute hemorrhage. No visible cortical or central venous thrombosis. IMPRESSION: No cortical or central venous thrombosis to explain the ICH. There are filling defects at the right transverse and transverse sigmoid dural sinuses which are prominent for arachnoid granulations but not definitive for thrombus. Attention at follow-up imaging. Electronically Signed   By: Tiburcio Pea M.D.   On: 06/17/2022 05:30   CT ANGIO HEAD NECK W WO CM  Result Date: 06/17/2022 CLINICAL DATA:  Found unresponsive. Intracranial hemorrhage. History of cocaine use EXAM: CT ANGIOGRAPHY HEAD AND NECK WITH AND WITHOUT CONTRAST TECHNIQUE: Multidetector CT imaging of the head and neck was performed using the standard protocol during bolus administration of intravenous contrast. Multiplanar CT image reconstructions and MIPs were obtained to evaluate the vascular anatomy.  Carotid stenosis measurements (when applicable) are obtained utilizing NASCET criteria, using the distal internal carotid diameter as the denominator. RADIATION DOSE REDUCTION: This exam was performed according to the departmental dose-optimization program which includes automated exposure control, adjustment of the mA and/or kV according to patient size and/or use of iterative reconstruction technique. CONTRAST:  75mL OMNIPAQUE IOHEXOL 350 MG/ML SOLN COMPARISON:  Head CT from earlier today FINDINGS: CTA NECK FINDINGS Aortic arch: Unremarkable Right carotid system: Mixed density plaque at the bifurcation with some low-density plaque bulging into the ICA bulb. No ulceration or flow limiting stenosis Left carotid system: Mainly low-density plaque at the bifurcation without stenosis or ulceration. Vertebral arteries: No proximal subclavian stenosis. The vertebral arteries are somewhat tortuous but smoothly contoured and diffusely patent Skeleton: No acute or aggressive finding. Other neck: No acute finding Upper chest: Mild opacification of dependent lungs which could be atelectasis. Centrilobular emphysema. Review of the MIP images confirms the above findings CTA HEAD FINDINGS Anterior circulation: No aneurysm or spot sign seen underlying the ICH. There is a aneurysm projecting leftward from the left carotid terminus which measures 4 mm. No adjacent hemorrhage. No major branch occlusion. There is undulation of the bilateral intracranial branches which is likely atheromatous given findings in the neck. No segmental beading. Some less intense flow and right MCA branches is likely related to mass effect by the ICH. Posterior circulation: The vertebral and basilar arteries arediffusely patent. Undulation of the left more than right PCA with up to moderate narrowing on the left, likely atheromatous given the findings in the neck. Venous sinuses: Reference dedicated CT venogram Anatomic variants: No acute finding Review of  the MIP images confirms the above findings IMPRESSION: 1. No vascular lesion or spot sign seen at the acute ICH. 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 3. 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 Electronically Signed   By: Audry Riles.D.  On: 06/17/2022 05:18   CT Head Wo Contrast  Result Date: 06/17/2022 CLINICAL DATA:  Altered mental status EXAM: CT HEAD WITHOUT CONTRAST TECHNIQUE: Contiguous axial images were obtained from the base of the skull through the vertex without intravenous contrast. RADIATION DOSE REDUCTION: This exam was performed according to the departmental dose-optimization program which includes automated exposure control, adjustment of the mA and/or kV according to patient size and/or use of iterative reconstruction technique. COMPARISON:  None Available. FINDINGS: Brain: There is a large area of parenchymal hemorrhage identified in the right cerebral hemisphere centered primarily within the basal ganglia and extending superiorly into the centrum semi ovale. This measures approximately 4.7 x 4.0 cm in greatest AP and transverse dimensions respectively. It extends for approximately 4.7 cm in craniocaudad dimension. Significant mass-effect upon the right lateral ventricle is noted. Mild midline shift of approximately 5 mm is noted. Some surrounding edema is seen as well. No other hemorrhage is noted. Relative loss of the sulcal markings is noted likely related to some increased intracranial pressure. Vascular: No hyperdense vessel or unexpected calcification. Skull: Normal. Negative for fracture or focal lesion. Sinuses/Orbits: No acute finding. Other: None. IMPRESSION: Right-sided intraparenchymal hemorrhage as described with associated edema and mild midline shift of 5 mm from right to left. Critical Value/emergent results were called by telephone at the time of interpretation on 06/17/2022 at 2:27 am to Dr. Donna Bernard , who verbally acknowledged these  results. Electronically Signed   By: Alcide Clever M.D.   On: 06/17/2022 02:29   DG Abdomen 1 View  Result Date: 06/17/2022 CLINICAL DATA:  Check gastric catheter placement EXAM: ABDOMEN - 1 VIEW COMPARISON:  None Available. FINDINGS: Gastric catheter is noted extending into the stomach. Proximal side port lies at the gastroesophageal junction. This should be advanced further into the stomach. No free air is seen. No bony abnormality is noted. IMPRESSION: Gastric catheter as described. Electronically Signed   By: Alcide Clever M.D.   On: 06/17/2022 02:16   DG Chest Port 1 View  Result Date: 06/17/2022 CLINICAL DATA:  Check endotracheal tube placement EXAM: PORTABLE CHEST 1 VIEW COMPARISON:  10/21/2020 FINDINGS: Cardiac shadow is within normal limits. Endotracheal tube is noted 1.5 cm above the carina. Gastric catheter is noted extending into the stomach. Patchy increased density is noted in the right middle lobe beneath the minor fissure consistent with acute infiltrate. No sizable effusion is noted. No bony abnormality is seen. IMPRESSION: Right middle lobe infiltrate. Tubes and lines as described. Electronically Signed   By: Alcide Clever M.D.   On: 06/17/2022 02:16     PHYSICAL EXAM  Temp:  [97.7 F (36.5 C)-99.9 F (37.7 C)] 99 F (37.2 C) (05/19 1518) Pulse Rate:  [74-113] 95 (05/19 1550) Resp:  [18-32] 26 (05/19 1550) BP: (99-189)/(61-90) 136/81 (05/19 1400) SpO2:  [93 %-100 %] 97 % (05/19 1550) FiO2 (%):  [40 %] 40 % (05/19 1550)   General:  She is intubated and on mechanical ventilation. Neuro: She will blink her eyes to command.  Give me thumbs up on the right hand.  Does not open eyes to command, unable to nod yes or no to questions.  Will open eyes to stimuli but not track examiner.  Pupils are 2 mm and sluggish blink to threat on the right and on the left.  Does not track examiner.  Oculocephalic intact.  Gag cough intact.  Corneals positive on the right absent on the left.  No  movement in the left  arm or leg.  She withdraws slightly in the right arm and leg to painful stimuli.  She was not able to grip my fingers or consistently follow commands on the right side.    ASSESSMENT/PLAN Ms. ANANA DEYTON is a 47 y.o. female with history of cocaine abuse admitted for unresponsiveness.  Intubated in ER for airway protection.  No tPA given due to ICH.    ICH:  right BG and CR ICH, still more likely due to cocaine induced hypertension vs. 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 Vasospasm from ICH vs. 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  2D Echo EF 50 to 55% LDL 65 HgbA1c 5.9 UDS positive for cocaine Heparin subcu for VTE prophylaxis No antithrombotic prior to admission, now on No antithrombotic due to ICH. Therapy recommendations: Pending Disposition: Pending, prognosis poor, palliative care on board. Family meeting held again today.  Continue full support and care.  Cerebral edema CT right BG and CR large ICH, 5 mm midline shift CT repeat 5/12 stable ICH with leftward midline shift of 5 mm CT repeat 5/13 unchanged appearance of right ICH with adjacent edema. Midline shift is 5 mm. Na 151->155->157->159->  155->158->150->159->162->161->159->158->155 -> 154>153 off 3% saline Na monitoring LTM EEG cortical dysfunction arising from right hemisphere with diffuse encephalopathy, no seizures  Respiratory failure Aspiration pneumonia Leukocytosis Pneumothorax  Intubated on vent Still on fentanyl, low dose CCM on board CXR right middle lobe infiltrate On azithromycin and Rocephin -> unasyn and zyvox Tmax 100.6->100.3->afebrile->100.3 -> 101.9 WBC 22.2->20.1->15.8->14.0->13.5->15.0 -> 16.5>20 S/p chest tube  Hypertension BP unstable Off cleviprex Labetalol and hydralazine IV PRN BP goal < 160 Long term BP goal normotensive Due to increased creatinine we will hold off on lisinopril for now.  Can use as needed to control blood pressure.  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  Hospital day # 9  Some mild improvement in exam she is able to blink to command and give a thumbs up on the right hand.  Updated several family members in the room.  Continue ongoing therapy support.  Appreciate CCM assistance and palliative care assistance.  This patient is critically ill due to large ICH, stroke, dural venous sinus thrombosis, cerebral edema, cocaine abuse, AKI, pneumonia, UTI and at significant risk of neurological worsening, death form herniation, brain death, seizure, sepsis. This patient's care requires constant monitoring of vital signs, hemodynamics, respiratory and cardiac monitoring,  review of multiple databases, neurological assessment,  discussion with family, other specialists and medical decision making of high complexity. I spent 35 minutes of neurocritical care time in the care of this patient.  To contact Stroke Continuity provider, please refer to [email or web address]. After hours, contact General Neurology

## 2022-06-26 NOTE — Progress Notes (Signed)
NAME:  Rebecca [name], MRN:  [id number], DOB:  06-Dec-1975, LOS: 9 ADMISSION DATE:  06/17/2022, CONSULTATION DATE:  06/26/22 REFERRING MD:  EDP, CHIEF COMPLAINT:  unresponsive   History of Present Illness:  47 yo female with hx of polysubstance abuse presented brought to [facility] ED with AMS.  Intubated for airway protection.  CT head showed large Rt ICH with 5 mm midline shift.  UDS positive for cocaine.  Transferred to [facility] for further management.  PCCM consulted to assist with management in ICU.   Hx from chart and medical team.  Pertinent  Medical History  Substance abuse  Significant Hospital Events: Including procedures, antibiotic start and stop dates in addition to other pertinent events   5/10 Admit, neurosurgery consulted 5/11 remains on ventilator with hypertonic saline and EEG in place. CVC placed.  5/12 no acute events overnight MRI brain 5/12 >> multiple acute infarcts in various distributions consistent with central embolic process, most affected right MCA territory.  Right basal ganglia hematoma.  Dural venous sinus thrombosis in the right sigmoid sinus extending up into the upper right IJ.  Mild dilation temporal horn left lateral ventricle with 5 mm of midline shift  Interim History / Subjective:  Note palliative care discussions from 5/18 Lightening sedation, fentanyl 200 Tolerating PSV this morning I/O+ 11.7 L total   Objective   Blood pressure 115/73, pulse 80, temperature 99.7 F (37.6 C), resp. rate 20, height 5\' 5"  (1.651 m), weight 62.8 kg, SpO2 94 %. CVP:  [0 mmHg-10 mmHg] 1 mmHg  Vent Mode: PSV;CPAP FiO2 (%):  [40 %] 40 % Set Rate:  [18 bmp] 18 bmp Vt Set:  [480 mL] 480 mL PEEP:  [8 cmH20] 8 cmH20 Pressure Support:  [5 cmH20] 5 cmH20 Plateau Pressure:  [18 cmH20-21 cmH20] 21 cmH20   Intake/Output Summary (Last 24 hours) at 06/26/2022 0940 Last data filed at 06/26/2022 4098 Gross per 24 hour  Intake 2658.33 ml  Output 750 ml  Net 1908.33 ml   Filed  Weights   06/23/22 0500 06/24/22 0500 06/25/22 0500  Weight: 61.6 kg 61.4 kg 62.8 kg   Physical exam General: Acute and chronic ill-appearing woman lying in bed on mechanical ventilation HEENT: ET tube in place, oropharynx otherwise clear, moist Neuro: Pupils 2 mm, equal.  She will turn to voice and track.  Strong cough.  Follows commands on the right.  No movement on the left today.  Has had flicker on previous exams CV: Distant, regular, no murmur PULM: Coarse bilaterally.  Left chest tube with no air leak to suction GI: Nondistended, positive bowel sounds Extremities: Foot drop boots in place, no edema Skin: No rash  Resolved Hospital Problem list     Assessment & Plan:   Acute right basal ganglia intraparenchymal hemorrhage Cerebral edema with brain compression R MCA Stroke and B ACA punctate infarcts Acute R Sigmoid sinus thrombosis and IJ DVST Induced hypernatremia and hyperchloremia -Now off hypertonic saline, following sodium -Appreciate neurology input and management.  Note poor prognosis, suspect she will remain plegic on the left.  Question whether she will be able to protect her airway -Not in a position for anticoagulation due to basal ganglia bleeding -Greatly appreciate palliative care input.  Difficult discussions with family who understands issues regarding QOL.  Initially were strongly considering withdrawal of care but at this time want to continue with all aggressive measures.  Plan to consider further, continue discussions  Acute Hypoxic Respiratory Failure Bilateral multifocal pneumonia with MRSA L  Pneumothorax s/p chest tube  -Okay for PSV as she can tolerate, on PS currently.  Airway protection will be large consideration, significant certain that she will not be able to protect her airway due to her neurological status -Continue chest tube to suction while she is on positive pressure.  Follow intermittent chest x-ray -Unasyn and linezolid as  ordered  Hypertensive Emergency - improved, good control currently. -Amlodipine and lisinopril held on 5/18 given marginal blood pressure (on fentanyl) and renal insufficiency.  Consider restart when stabilized -Hydralazine available if needed  Pre-renal AKI - 2/2 diuresis Progressive renal insufficiency 5/18 -Recheck BMP now.  Follow urine output -Defer any further diuresis for now. Lisinopril on hold as above  New HFrEF, not in exacerbation - LVEF 50-55% this admission SVT - resolved -Telemetry monitoring  Anemia of critical illness - stable -Follow CBC -Transfusion threshold hemoglobin 7.0  Demand cardiac ischemia with elevated troponin High-sensitivity troponin peaked at 708. -Telemetry monitoring  Hypokalemia - resolved -Recheck BMP now, replete as indicated  Urinary tract infection Treated with CTX x 4 days, also with empiric PNA coverage  Best Practice:   Diet/type: tubefeeds DVT prophylaxis: SCD GI prophylaxis: PPI Lines: N/A Foley:  Yes, and it is still needed Code Status:  full code Last date of multidisciplinary goals of care discussion: Appreciate palliative care input.  Significant discussions on 5/18.  Initially patient's daughter was ready to proceed with transition to comfort.  After discussions with other family decision made to continue all aggressive care which is the current plan.  Will continue to discuss.  Critical care time: 33 minutes    Levy Pupa, MD, PhD 06/26/2022, 9:40 AM [facility] [phone] or if no answer before 7:00PM call [phone] For any issues after 7:00PM please call eLink [phone]

## 2022-06-26 NOTE — Progress Notes (Signed)
Palliative Medicine Inpatient Follow Up Note HPI: 47 yo female with hx of polysubstance abuse presented brought to [facility] ED with AMS. Identified to have significant R sided ICH. Is receiving care in the ICU as is intubation. Neurologically has a poor prognosis therefore the Palliative care team has been asked to get involved for further conversations.   Today's Discussion 06/26/2022  *Please note that this is a verbal dictation therefore any spelling or grammatical errors are due to the "Dragon Medical One" system interpretation.  Chart reviewed inclusive of vital signs, progress notes, laboratory results, and diagnostic images.   Family meeting held this afternoon in the presence of patients daughter, [name], daughter Judeth Cornfield, niece Jewel Baize, cousin Fareedah, and uncles Jake Shark and Maisie Fus.   We reviewed the information as provided on the prior evening in the setting of the severity of strokes that Raimey has endured. We reviewed her poor health state prior and how she was one to live on the edge. I shared the long term outlooks for Lilien presently in the setting of her multiple strokes and hemiparesis. We reviewed her pulmonary state and there is great concern regarding patients tolerance off of ventilatory support.   We reviewed what he life may look like if the family chooses to proceed with aggressive interventions such as tracheostomy and gastrostomy tube. I shared that she is very likely to be in the bed much of the remainder of her life and susceptible to infections of her lungs, urinary tract, among others. We reviewed the discomfort associated with long term vent support. We reviewed the likely degradation of her skin. We reviewed the reality that she will most probably require care for the rest of her life. While all of Juliah's family do attest that Arijah would never want to live a life like that, her daughter [name] does feel that she is seeing encouraging signs. [name] shares  that [place] was able to blink and squeeze her hand. She expresses that this give her hope to continue on in terms of aggressive interventions.   I shared the alternative to the above aggressive measures would be to allow comfort and dignity as Shun transitions from this life to the next. I shared that this could be done through comfort focused care inclusive of removing Wendee from vent support and only giving medications to alleviate pain and suffering.   We further discussed resuscitation status and the bodily harm that could be caused as a result of these efforts in someone who already suffers from various acute on chronic disease processes. Patients family vocalize the desire to have some time to talk to one another about the options. ______________________________  Addendum:  I was requested to come back to 4M as family had made a decision in terms of what direction the would like to go with [facility] care.   Patients daughter, [name] shares that she still feels Nicoline has a fighting chance for improvement. She realizes that [place] and [place] until 5[place] but wants to pursue an aggressive path of care inclusve of a tracheostomy, gastrostomy tube, and long term placement. She feels that more time is needed. I offered support and understanding. I shared that Lavern's body will show her family the direction it is going over time.   I did strongly advocate for a DNAR though at this time patient daughter would like to keep [place] a full code status.   Questions and concerns addressed/Palliative Support Provided.   Objective Assessment:  Vital Signs Vitals:   06/26/22 1518 06/26/22 1550  BP:    Pulse:  95  Resp:  (!) 26  Temp: 99 F (37.2 C)   SpO2:  97%    Intake/Output Summary (Last 24 hours) at 06/26/2022 1607 Last data filed at 06/26/2022 1400 Gross per 24 hour  Intake 2503.66 ml  Output 510 ml  Net 1993.66 ml    Last Weight  Most  recent update: 06/25/2022  5:10 AM    Weight  62.8 kg (138 lb 7.2 oz)            Gen: Middle-aged African-American female chronically ill in appearance HEENT: OGT, ETT, dry mucous membranes CV: Irregular rate and rhythm PULM: On mechanical ventilatory support ABD: Slight distention soft hypoactive bowel sounds EXT: Peripheral edema edema  Neuro: Able to follow some commands on the right  SUMMARY OF RECOMMENDATIONS   Full Code/Full scope of care inclusive of tracheostomy and gastrostomy tube placement  FMLA paperwork has been completed and given to patients daughter  Appreciate TOC as patient will need long term placement  The PMT will remain peripheral from this point given clarity of goals. If patient should decline and additional conversations are warranted please do not hesitate to reach out  Total Time: 60 Billing based on MDM: High ______________________________________________________________________________________ Lamarr Lulas [place] Palliative Medicine Team Team Cell Phone: [phone] Please utilize secure chat with additional questions, if there is no response within 30 minutes please call the above phone number  Palliative Medicine Team providers are available by phone from 7am to 7pm daily and can be reached through the team cell phone.  Should this patient require assistance outside of these hours, please call the patient's attending physician.

## 2022-06-27 ENCOUNTER — Inpatient Hospital Stay (HOSPITAL_COMMUNITY): Payer: Medicaid Other

## 2022-06-27 DIAGNOSIS — Z9689 Presence of other specified functional implants: Secondary | ICD-10-CM | POA: Diagnosis not present

## 2022-06-27 DIAGNOSIS — T17998A Other foreign object in respiratory tract, part unspecified causing other injury, initial encounter: Secondary | ICD-10-CM | POA: Diagnosis not present

## 2022-06-27 DIAGNOSIS — I610 Nontraumatic intracerebral hemorrhage in hemisphere, subcortical: Secondary | ICD-10-CM | POA: Diagnosis not present

## 2022-06-27 DIAGNOSIS — G9340 Encephalopathy, unspecified: Secondary | ICD-10-CM | POA: Diagnosis not present

## 2022-06-27 DIAGNOSIS — J9601 Acute respiratory failure with hypoxia: Secondary | ICD-10-CM | POA: Diagnosis not present

## 2022-06-27 LAB — BASIC METABOLIC PANEL
Anion gap: 7 (ref 5–15)
BUN: 30 mg/dL — ABNORMAL HIGH (ref 6–20)
CO2: 26 mmol/L (ref 22–32)
Calcium: 8.4 mg/dL — ABNORMAL LOW (ref 8.9–10.3)
Chloride: 118 mmol/L — ABNORMAL HIGH (ref 98–111)
Creatinine, Ser: 0.86 mg/dL (ref 0.44–1.00)
GFR, Estimated: >60 mL/min (ref >60)
Glucose, Bld: 104 mg/dL — ABNORMAL HIGH (ref 70–99)
Potassium: 3.3 mmol/L — ABNORMAL LOW (ref 3.5–5.1)
Sodium: 151 mmol/L — ABNORMAL HIGH (ref 135–145)

## 2022-06-27 LAB — MAGNESIUM: Magnesium: 2.5 mg/dL — ABNORMAL HIGH (ref 1.7–2.4)

## 2022-06-27 LAB — PHOSPHORUS: Phosphorus: 3.7 mg/dL (ref 2.5–4.6)

## 2022-06-27 LAB — CBC
HCT: 25.2 % — ABNORMAL LOW (ref 36.0–46.0)
Hemoglobin: 7.3 g/dL — ABNORMAL LOW (ref 12.0–15.0)
MCH: 22.1 pg — ABNORMAL LOW (ref 26.0–34.0)
MCHC: 29 g/dL — ABNORMAL LOW (ref 30.0–36.0)
MCV: 76.1 fL — ABNORMAL LOW (ref 80.0–100.0)
Platelets: 380 10*3/uL (ref 150–400)
RBC: 3.31 MIL/uL — ABNORMAL LOW (ref 3.87–5.11)
RDW: 20.2 % — ABNORMAL HIGH (ref 11.5–15.5)
WBC: 12.4 10*3/uL — ABNORMAL HIGH (ref 4.0–10.5)
nRBC: 0 % (ref 0.0–0.2)

## 2022-06-27 LAB — CULTURE, BLOOD (ROUTINE X 2)

## 2022-06-27 MED ORDER — POTASSIUM CHLORIDE 20 MEQ PO PACK
20.0000 meq | PACK | ORAL | Status: AC
  Administered 2022-06-27 (×2): 20 meq
  Filled 2022-06-27 (×2): qty 1

## 2022-06-27 MED ORDER — FREE WATER
200.0000 mL | Status: DC
  Administered 2022-06-27 – 2022-07-01 (×21): 200 mL

## 2022-06-27 MED ORDER — MIDAZOLAM HCL 2 MG/2ML IJ SOLN
4.0000 mg | Freq: Once | INTRAMUSCULAR | Status: AC
  Administered 2022-06-27: 2 mg via INTRAVENOUS
  Filled 2022-06-27: qty 4

## 2022-06-27 MED ORDER — CARVEDILOL 3.125 MG PO TABS
3.1250 mg | ORAL_TABLET | Freq: Two times a day (BID) | ORAL | Status: DC
  Administered 2022-06-27 – 2022-07-01 (×6): 3.125 mg
  Filled 2022-06-27 (×6): qty 1

## 2022-06-27 MED ORDER — ROCURONIUM BROMIDE 10 MG/ML (PF) SYRINGE
100.0000 mg | PREFILLED_SYRINGE | Freq: Once | INTRAVENOUS | Status: AC
  Administered 2022-06-27: 80 mg via INTRAVENOUS
  Filled 2022-06-27: qty 10

## 2022-06-27 MED ORDER — FENTANYL CITRATE PF 50 MCG/ML IJ SOSY: 200.0000 ug | PREFILLED_SYRINGE | Freq: Once | INTRAMUSCULAR | Status: DC

## 2022-06-27 MED ORDER — POTASSIUM CHLORIDE 10 MEQ/50ML IV SOLN
10.0000 meq | INTRAVENOUS | Status: AC
  Administered 2022-06-27 (×4): 10 meq via INTRAVENOUS
  Filled 2022-06-27 (×4): qty 50

## 2022-06-27 MED ORDER — SENNOSIDES-DOCUSATE SODIUM 8.6-50 MG PO TABS
1.0000 | ORAL_TABLET | Freq: Every day | ORAL | Status: DC
  Administered 2022-06-27: 1
  Filled 2022-06-27: qty 1

## 2022-06-27 MED ORDER — ETOMIDATE 2 MG/ML IV SOLN
20.0000 mg | Freq: Once | INTRAVENOUS | Status: AC
  Administered 2022-06-27: 20 mg via INTRAVENOUS
  Filled 2022-06-27: qty 10

## 2022-06-27 NOTE — Progress Notes (Signed)
Called to room pt requiring increased FIO2 and peep  Trach site evaluated. No sig bleeding, no Monona air.  Breath sounds equal but w/ coarse bilateral rhonchi  PCXR earlier today showed the Trach in good position and no Pneumothorax Suspect this just represents de recruitment during trach  Peep already titrated up to 10 no increase in PIP pressures.  Plan Cont peep 10 & hold to assist w/ recruitment and to help decrease FIO2 needs Will f/u PCXR to ensure no late PTX    Simonne Martinet ACNP-BC [facility]/Critical Care Pager # [phone] OR # [phone] if no answer

## 2022-06-27 NOTE — Progress Notes (Signed)
SLP Cancellation Note  Patient Details Name: LEVEDA CARAS MRN: [id number] DOB: October 24, 1975   Cancelled treatment:       Reason Eval/Treat Not Completed: Patient not medically ready Patient with new tracheostomy 06/27/2022 and remains on the vent at this time. Orders for SLP eval and treat for PMSV and swallowing received. Will follow pt closely for readiness for SLP interventions as appropriate.   Gracieann Stannard I. Vear Clock, MS, CCC-SLP Neuro Diagnostic Specialist  Acute Rehabilitation Services Office number: [phone]  Scheryl Marten 06/27/2022, 3:21 PM

## 2022-06-27 NOTE — Progress Notes (Signed)
PCXR reviewed No PTX  Some increase in right basilar atx Plan Cont plan as outlined  Simonne Martinet ACNP-BC [facility] Pulmonary/Critical Care Pager # [phone] OR # [phone] if no answer

## 2022-06-27 NOTE — Progress Notes (Signed)
Pt failed wean this AM due to no pt effort. RT will attempt to wean pt again later in the day

## 2022-06-27 NOTE — Procedures (Signed)
Cortrak  Person Inserting Tube:  Eriberto Felch L, RD Tube Type:  Cortrak - 43 inches Tube Size:  10 Tube Location:  Left nare Secured by: Bridle Technique Used to Measure Tube Placement:  Marking at nare/corner of mouth Cortrak Secured At:  72 cm   Cortrak Tube Team Note:  Consult received to place a Cortrak feeding tube.   X-ray is required, abdominal x-ray has been ordered by the Cortrak team. Please confirm tube placement before using the Cortrak tube.   If the tube becomes dislodged please keep the tube and contact the Cortrak team at [email or web address] for replacement.  If after hours and replacement cannot be delayed, place a NG tube and confirm placement with an abdominal x-ray.    Riccardo Holeman RD, LDN Clinical Dietitian See [facility] for contact information.    

## 2022-06-27 NOTE — Procedures (Signed)
Bronchoscopy Procedure Note  Rebecca [name]  [id number]  02/12/1975  Date:06/27/22  Time:3:01 PM   Provider Performing:Nandika Stetzer C Katrinka Blazing   Procedure(s):  Flexible Bronchoscopy [phone]) and Initial Therapeutic Aspiration of Tracheobronchial Tree [phone])  Indication(s) Mucus plugging of bronchi Persistent resp failure Tracheostomy placement  Consent Risks of the procedure as well as the alternatives and risks of each were explained to the patient and/or caregiver.  Consent for the procedure was obtained and is signed in the bedside chart  Anesthesia In place for trach   Time Out Verified patient identification, verified procedure, site/side was marked, verified correct patient position, special equipment/implants available, medications/allergies/relevant history reviewed, required imaging and test results available.   Sterile Technique Usual hand hygiene, masks, gowns, and gloves were used   Procedure Description Bronchoscope advanced through endotracheal tube and into airway.  Airways were examined down to subsegmental level with findings noted below.   Following diagnostic evaluation, Therapeutic aspiration performed in LLL of thick tenacious tan secretions  Following therapeutic aspiration, assistance with direct visualization of shiley 6 going into tracheal lumen provided.  Bronchoscopy through new tracheostomy tube showed good position.  Findings:  - Mucus plugging LLL - New trach in good position   Complications/Tolerance None; patient tolerated the procedure well. Chest X-ray is needed post procedure.   EBL Minimal   Specimen(s) None

## 2022-06-27 NOTE — Progress Notes (Signed)
STROKE TEAM PROGRESS NOTE   SUBJECTIVE (INTERVAL HISTORY) Patient remains sedated and intubated.  She is getting elective tracheostomy later today.  No family at the bedside.  She remains on antibiotics for pneumonia.  Neurological exam is unchanged  OBJECTIVE Temp:  [97.9 F (36.6 C)-99.3 F (37.4 C)] 98.6 F (37 C) (05/20 1200) Pulse Rate:  [75-103] 84 (05/20 1217) Cardiac Rhythm: Normal sinus rhythm (05/20 0800) Resp:  [17-30] 20 (05/20 1217) BP: (103-165)/(66-101) 111/73 (05/20 1200) SpO2:  [91 %-100 %] 96 % (05/20 1217) FiO2 (%):  [40 %] 40 % (05/20 1217) Weight:  [64.4 kg] 64.4 kg (05/20 0355)  Recent Labs  Lab 06/25/22 0307 06/25/22 0750 06/25/22 1151 06/25/22 1530 06/26/22 1952  GLUCAP 135* 97 87 102* 112*   Recent Labs  Lab 06/23/22 0256 06/24/22 0358 06/25/22 0349 06/26/22 0955 06/27/22 0355  NA 155* 154* 153* 154* 151*  K 3.7 3.5 3.6 3.5 3.3*  CL 120* 119* 116* 120* 118*  CO2 26 25 27 26 26   GLUCOSE 110* 96 133* 120* 104*  BUN 26* 35* 43* 33* 30*  CREATININE 0.84 1.15* 1.64* 1.00 0.86  CALCIUM 8.9 8.6* 8.3* 8.6* 8.4*  MG  --   --   --   --  2.5*  PHOS  --   --   --   --  3.7   No results for input(s): "AST", "ALT", "ALKPHOS", "BILITOT", "PROT", "ALBUMIN" in the last 168 hours.  Recent Labs  Lab 06/22/22 0329 06/23/22 0256 06/24/22 0358 06/25/22 0349 06/27/22 0355  WBC 13.5* 15.0* 16.5* 20.7* 12.4*  HGB 8.3* 8.6* 8.5* 7.6* 7.3*  HCT 28.3* 29.7* 29.5* 26.3* 25.2*  MCV 75.1* 75.4* 74.3* 75.8* 76.1*  PLT 297 305 351 354 380   No results for input(s): "CKTOTAL", "CKMB", "CKMBINDEX", "TROPONINI" in the last 168 hours.  No results for input(s): "LABPROT", "INR" in the last 72 hours. No results for input(s): "COLORURINE", "LABSPEC", "PHURINE", "GLUCOSEU", "HGBUR", "BILIRUBINUR", "KETONESUR", "PROTEINUR", "UROBILINOGEN", "NITRITE", "LEUKOCYTESUR" in the last 72 hours.  Invalid input(s): "APPERANCEUR"     Component Value Date/Time   CHOL 126  06/18/2022 0242   TRIG 123 06/21/2022 0407   HDL 45 06/18/2022 0242   CHOLHDL 2.8 06/18/2022 0242   VLDL 16 06/18/2022 0242   LDLCALC 65 06/18/2022 0242   Lab Results  Component Value Date   HGBA1C 5.9 (H) 06/18/2022      Component Value Date/Time   LABOPIA NONE DETECTED 06/17/2022 0206   COCAINSCRNUR POSITIVE (A) 06/17/2022 0206   LABBENZ NONE DETECTED 06/17/2022 0206   AMPHETMU NONE DETECTED 06/17/2022 0206   THCU NONE DETECTED 06/17/2022 0206   LABBARB NONE DETECTED 06/17/2022 0206    No results for input(s): "ETH" in the last 168 hours.   DG Abd Portable 1V  Result Date: 06/27/2022 CLINICAL DATA:  Evaluate feeding tube placement. EXAM: PORTABLE ABDOMEN - 1 VIEW COMPARISON:  06/24/2022 FINDINGS: Percutaneous pigtail thoracostomy tube is again noted overlying the left lower chest. Interval placement of feeding tube with tip below the level of the GE junction in the expected location of the body of stomach. Similar appearance of gaseous distension of the colon. IMPRESSION: Interval placement of feeding tube with tip below the level of the GE junction in the expected location of the body of stomach. Electronically Signed   By: Signa Kell M.D.   On: 06/27/2022 12:45   DG Abd 1 View  Result Date: 06/24/2022 CLINICAL DATA:  OG tube placement EXAM: ABDOMEN - 1 VIEW  COMPARISON:  06/24/2022 FINDINGS: OG tube is in place with the tip in the fundus of the stomach. Left basilar chest tube remains in place. Left base atelectasis. IMPRESSION: OG tube tip in the fundus of the stomach. Electronically Signed   By: Charlett Nose M.D.   On: 06/24/2022 03:43   DG Abd 1 View  Result Date: 06/24/2022 CLINICAL DATA:  [id number] Encounter for imaging study to confirm orogastric (OG) tube placement [id number] EXAM: ABDOMEN - 1 VIEW COMPARISON:  X-ray abdomen 06/18/2022 FINDINGS: Enteric tube with tip and side port overlying the expected location of the gastric lumen. Left upper lobe pigtail. Gaseous distension  of the large bowel. No radio-opaque calculi or other significant radiographic abnormality are seen. IMPRESSION: Enteric tube in good position. Electronically Signed   By: Tish Frederickson M.D.   On: 06/24/2022 02:06   DG CHEST PORT 1 VIEW  Result Date: 06/23/2022 CLINICAL DATA:  [id number] Pneumothorax [id number] EXAM: PORTABLE CHEST 1 VIEW COMPARISON:  Jun 21, 2022 FINDINGS: The cardiomediastinal silhouette is unchanged in contour.LEFT-sided chest tube. LEFT neck CVC tip terminates over the SVC. ETT tip terminates 6.7 cm above the carina. Enteric tube tip and side port project over the esophagus. Favored trace LEFT pleural effusion. No significant pneumothorax. Bibasilar atelectasis. IMPRESSION: 1. Favored trace LEFT pleural effusion. No significant pneumothorax. 2. Enteric tube tip and side port project over the esophagus. Recommend advancement. Electronically Signed   By: Meda Klinefelter M.D.   On: 06/23/2022 14:00   DG Chest Port 1 View  Result Date: 06/21/2022 CLINICAL DATA:  Left pneumothorax EXAM: PORTABLE CHEST 1 VIEW COMPARISON:  Previous studies including the examination done earlier today FINDINGS: There is interval decrease in size of small left apical pneumothorax. There is tiny residual pneumothorax in the medial left apex in the current study. Cardiac size is within normal limits. There are no signs of pulmonary edema or new focal infiltrates. Costophrenic angles are clear. Tip of left chest tube is seen in the medial left lower lung field. Tip of NG tube is seen in the fundus of the stomach. Tip of left IJ central venous catheter is seen in superior vena cava. Tip of endotracheal tube is 5.5 cm above the carina. IMPRESSION: There is interval decrease in size of left pneumothorax with tiny residual pneumothorax in the medial left apex. There are no new infiltrates or signs of pulmonary edema. Electronically Signed   By: Ernie Avena M.D.   On: 06/21/2022 13:24   DG CHEST PORT 1  VIEW  Result Date: 06/21/2022 CLINICAL DATA:  Pneumothorax EXAM: PORTABLE CHEST 1 VIEW COMPARISON:  CXR 06/20/22 FINDINGS: Left-sided pleural pigtail drainage catheter in place with unchanged positioning. There is a small left apical pneumothorax, likely decreased in size compared prior exam. Left-sided central venous catheter with unchanged positioning. Enteric tube courses below diaphragm with the tip out of the field of view. Endotracheal tube terminates approximately 5 cm above the carina. No pleural effusion. No focal airspace opacity. Unchanged cardiac and mediastinal contours. No radiographically apparent new displaced rib fractures. Visualized upper abdomen is unremarkable. IMPRESSION: Small left apical pneumothorax, likely decreased in size compared to prior exam. Thoracostomy tube in place. Electronically Signed   By: Lorenza Cambridge M.D.   On: 06/21/2022 10:01   DG Chest Port 1 View  Result Date: 06/20/2022 CLINICAL DATA:  Chest tube placement. EXAM: PORTABLE CHEST 1 VIEW COMPARISON:  One-view chest x-ray 06/20/2022 at 12:25 p.m. FINDINGS: The heart is enlarged. Endotracheal tube is stable. Left  IJ catheter is stable. Gastric tube terminates in the fundus the stomach. A left sided pleural pigtail catheter was placed. The pneumothorax is reduced but not eliminated. IMPRESSION: 1. Interval placement of left-sided pleural pigtail catheter with reduction in left-sided pneumothorax. 2. Stable cardiomegaly without failure. Electronically Signed   By: Marin Roberts M.D.   On: 06/20/2022 17:50   DG CHEST PORT 1 VIEW  Addendum Date: 06/20/2022   ADDENDUM REPORT: 06/20/2022 16:05 ADDENDUM: Critical Value/emergent results were called by telephone at the time of interpretation on 06/20/2022 at 4:03 pm to provider Priscella Mann, RN, who verbally acknowledged these results. Electronically Signed   By: Signa Kell M.D.   On: 06/20/2022 16:05   Result Date: 06/20/2022 CLINICAL DATA:  Hypoxia. EXAM:  PORTABLE CHEST 1 VIEW COMPARISON:  06/18/2018 for FINDINGS: ETT tip is stable above the carina. There is an enteric tube with tip coursing below the field of view. Left IJ catheter tip is in the distal SVC. There is a new moderate left-sided pneumothorax overlying the apex and lateral basal left lung. No signs of pleural effusion or edema. Persistent opacities within the right middle lobe. IMPRESSION: 1. New moderate left-sided pneumothorax. 2. Stable support apparatus. 3. Persistent right middle lobe opacities. Electronically Signed: By: Signa Kell M.D. On: 06/20/2022 15:41   CT HEAD WO CONTRAST ( )  Result Date: 06/20/2022 CLINICAL DATA:  Neuro deficit with acute stroke suspected EXAM: CT HEAD WITHOUT CONTRAST TECHNIQUE: Contiguous axial images were obtained from the base of the skull through the vertex without intravenous contrast. RADIATION DOSE REDUCTION: This exam was performed according to the departmental dose-optimization program which includes automated exposure control, adjustment of the mA and/or kV according to patient size and/or use of iterative reconstruction technique. COMPARISON:  Yesterday FINDINGS: Brain: Large ICH centered at the right basal ganglia and adjacent white matter, with tracking towards the right brainstem is unchanged in shape and size, measuring up to 5 cm anterior to posterior and 4 cm craniocaudal. Regional edema is unchanged. Extensive cortical infarct in the right posterior frontal and parietal lobe. Smaller but more apparent left parietal cortex infarcts since brain MRI yesterday. Midline shift is 5 mm. Vascular: No hyperdense vessel or unexpected calcification. Skull: Normal. Negative for fracture or focal lesion. Sinuses/Orbits: No acute finding. IMPRESSION: 1. Multiple infarcts by brain MRI yesterday, suspect progressive cortex infarction in the left parietal lobe. 2. Unchanged appearance of right ICH with adjacent edema. Midline shift is 5 mm. Electronically  Signed   By: Tiburcio Pea M.D.   On: 06/20/2022 06:09   CT HEAD WO CONTRAST ( )  Result Date: 06/19/2022 CLINICAL DATA:  Follow-up ICH EXAM: CT HEAD WITHOUT CONTRAST TECHNIQUE: Contiguous axial images were obtained from the base of the skull through the vertex without intravenous contrast. RADIATION DOSE REDUCTION: This exam was performed according to the departmental dose-optimization program which includes automated exposure control, adjustment of the mA and/or kV according to patient size and/or use of iterative reconstruction technique. COMPARISON:  MRI from earlier today. FINDINGS: Brain: Large acute hemorrhage centered at the right basal ganglia and adjacent white matter without progression in size or shape when compared to prior. Cytotoxic edema most apparent in the right frontal parietal cortex, infarcts being under appreciated when compared to preceding brain MRI. Diffuse effacement of subarachnoid spaces, leftward midline shift of 5 mm. Unchanged mild dilatation of the left lateral ventricle. Vascular: No hyperdense vessel or unexpected calcification. Skull: Normal. Negative for fracture or focal lesion. Sinuses/Orbits: No acute finding.  IMPRESSION: Right ICH and extensive cortical infarction that is stable from brain MRI earlier today. No new abnormality. Leftward midline shift of 5 mm. Electronically Signed   By: Tiburcio Pea M.D.   On: 06/19/2022 11:13   MR BRAIN W WO CONTRAST  Result Date: 06/19/2022 CLINICAL DATA:  Hemorrhagic stroke workup EXAM: MRI HEAD WITHOUT AND WITH CONTRAST TECHNIQUE: Multiplanar, multiecho pulse sequences of the brain and surrounding structures were obtained without and with intravenous contrast. CONTRAST:  6mL GADAVIST GADOBUTROL 1 MMOL/ML IV SOLN COMPARISON:  Head CT and CTA from 2 days prior FINDINGS: Brain: Multiple areas of restricted diffusion most confluent in the right parietal and posterior temporal cortex. Restricted diffusion is seen surrounding the  large and known right basal ganglia hemorrhage which measures up to 5.3 cm anterior to posterior with cleft of fluid surrounding the hematoma. Infarcts in other arterial distributions are also present, including along the left cerebral convexity (especially parietooccipital)and at the right brainstem. Midline shift measures 5 mm. There is periventricular FLAIR hyperintensity suggesting chronic small vessel disease. There is asymmetric mild dilatation of the left temporal horn. Vascular: Filling defect in the right sigmoid sinus and upper internal jugular vein. Skull and upper cervical spine: Normal marrow signal Sinuses/Orbits: Mild mucosal thickening in the mastoid air cells. Retention cysts in the right maxillary sinus. Diffuse mucosal thickening in the paranasal sinuses. Negative orbits. IMPRESSION: 1. 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 The most confluent area is in the right MCA territory with infarct likely underlying the right basal ganglia hematoma. 2. 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 The pattern of infarcts and hemorrhage do not correlate with the clot location however. 3. Mild dilatation of the temporal horn of the left lateral ventricle. 5 mm of midline shift. Electronically Signed   By: Tiburcio Pea M.D.   On: 06/19/2022 06:59   DG Abd 1 View  Result Date: 06/18/2022 CLINICAL DATA:  Check gastric catheter placement EXAM: ABDOMEN - 1 VIEW COMPARISON:  Film from earlier in the same day. FINDINGS: Gastric catheter has been advanced and now lies completely within the stomach. Scattered large and small bowel gas is noted. IMPRESSION: Gastric catheter within the stomach as described. Electronically Signed   By: Alcide Clever M.D.   On: 06/18/2022 23:47   DG Abd 1 View  Result Date: 06/18/2022 CLINICAL DATA:  Feeding tube placement. EXAM: ABDOMEN - 1 VIEW COMPARISON:  Jun 18, 2022  (6:44 p.m.) FINDINGS: A nasogastric tube is seen with its distal tip overlying the expected region of the body of the stomach. The distal side hole sits at the level of the gastroesophageal junction. The bowel gas pattern is normal. No radio-opaque calculi or other significant radiographic abnormality are seen. IMPRESSION: Nasogastric tube positioning, as described above. Further advancement of the NG tube by approximately 7 cm is recommended to decrease the risk of aspiration. Electronically Signed   By: Aram Candela M.D.   On: 06/18/2022 22:23   DG Abd Portable 1V  Result Date: 06/18/2022 CLINICAL DATA:  Check gastric catheter placement EXAM: PORTABLE ABDOMEN - 1 VIEW COMPARISON:  None Available. FINDINGS: Scattered large and small bowel gas is noted. Gastric catheter is noted with the tip in the stomach. Proximal side port lies in the distal esophagus. This should be advanced several cm deeper into the stomach. IMPRESSION: Gastric catheter as described. This should be advanced deeper into the stomach. Electronically Signed  By: Alcide Clever M.D.   On: 06/18/2022 21:24   DG CHEST PORT 1 VIEW  Result Date: 06/18/2022 CLINICAL DATA:  Evaluate central line placement EXAM: PORTABLE CHEST 1 VIEW COMPARISON:  Jun 18, 2022 FINDINGS: The side port of the NG tube is near the GE junction. The distal tip is in the stomach. The ETT is in good position. A left central line is been placed in the interval terminating in the central SVC. No pneumothorax. The left lung is clear. Right middle lobe infiltrate remains. No other interval changes. IMPRESSION: 1. Support apparatus as above. The side port of the NG tube is near the GE junction. Recommend advancing the tube. 2. The left lung is clear. Right middle lobe infiltrate remains. 3. No other interval changes. Electronically Signed   By: Gerome Sam III M.D.   On: 06/18/2022 17:20   Overnight EEG with video  Result Date: 06/18/2022 Charlsie Quest, MD      06/19/2022  9:37 AM Patient Name: Rebecca [name] MRN: [id number] Epilepsy Attending: Charlsie Quest Referring Physician/Provider: Erick Blinks, MD Duration: 06/17/2022 1215 to 06/18/2022 1215 Patient history: 47 y.o. female with hx of cocaine use p/w unresponsive and large R IPH with brain compression and 5mm midline shift. EEG to evaluate for seizure Level of alertness: Awake, asleep AEDs during EEG study: None Technical aspects: This EEG study was done with scalp electrodes positioned according to the 10-20 International system of electrode placement. Electrical activity was reviewed with band pass filter of 1-70Hz , sensitivity of 7 uV/mm, display speed of 49mm/sec with a 60Hz  notched filter applied as appropriate. EEG data were recorded continuously and digitally stored.  Video monitoring was available and reviewed as appropriate. Description: The posterior dominant rhythm consists of 7.5 Hz activity of moderate voltage (25-35 uV) seen predominantly in posterior head regions, symmetric and reactive to eye opening and eye closing. Sleep was characterized by vertex waves, sleep spindles (12 to 14 Hz), maximal frontocentral region. EEG showed continuous 3 to 6 Hz theta-delta slowing in right hemisphere, maximal right temporal region. Intermittent generalized 3-6hz  theta-delta slowing was also noted. Hyperventilation and photic stimulation were not performed.   ABNORMALITY - Continuous slow, right hemisphere, maximal right temporal region - Intermittent slow, generalized IMPRESSION: This study is suggestive of cortical dysfunction arising from right hemisphere, maximal right temporal region likely secondary to underlying structural abnormality. Additionally there is mild to moderate diffuse encephalopathy. No seizures or epileptiform discharges were seen throughout the recording. Charlsie Quest   DG Chest Port 1 View  Result Date: 06/18/2022 CLINICAL DATA:  Pulmonary infiltrates EXAM: PORTABLE CHEST 1  VIEW COMPARISON:  06/17/2022 FINDINGS: Endotracheal tube is in stable position. NG tube tip in the proximal stomach with the side port near the GE junction. The NG tube appears to be buckled/looped within the neck near the thoracic inlet. Heart mediastinal contours within normal limits. Right middle lobe airspace opacity again noted, stable. No confluent opacity on the left. No effusions or acute bony abnormality. IMPRESSION: Continued right middle lobe infiltrate, unchanged. NG tube appears to be buckled slashed looped in the upper esophagus near the thoracic inlet. Tip is in the proximal stomach. Electronically Signed   By: Charlett Nose M.D.   On: 06/18/2022 07:11   DG Abd 1 View  Result Date: 06/17/2022 CLINICAL DATA:  Nasogastric tube placement. EXAM: ABDOMEN - 1 VIEW COMPARISON:  Radiograph earlier today. FINDINGS: Tip of the enteric tube is below the diaphragm in the stomach, the  side port is just at the gastroesophageal junction. Normal upper abdominal bowel gas pattern. IMPRESSION: Tip of the enteric tube below the diaphragm in the stomach, side-port just at the gastroesophageal junction. Electronically Signed   By: Narda Rutherford M.D.   On: 06/17/2022 15:43   ECHOCARDIOGRAM COMPLETE  Result Date: 06/17/2022    ECHOCARDIOGRAM REPORT   Patient Name:   NOVENA PATERSON Date of Exam: 06/17/2022 Medical Rec #:  [id number]          Height:       65.0 in Accession #:    [id number]         Weight:       140.0 lb Date of Birth:  16-May-1975          BSA:          1.700 m Patient Age:    46 years           BP:           122/111 mmHg Patient Gender: F                  HR:           102 bpm. Exam Location:  Inpatient Procedure: 2D Echo, Cardiac Doppler and Color Doppler Indications:    Stroke  History:        Patient has no prior history of Echocardiogram examinations.                 ICH, sepsis; Risk Factors:Current Smoker and Substance abuse.  Sonographer:    Wallie Char Referring Phys: [id number] [facility]  Sonographer Comments: Technically challenging study due to limited acoustic windows and echo performed with patient supine and on artificial respirator. IMPRESSIONS  1. Left ventricular ejection fraction, by estimation, is 50 to 55%. The left ventricle has low normal function. The left ventricle has no regional wall motion abnormalities. There is moderate left ventricular hypertrophy. Left ventricular diastolic parameters are consistent with Grade I diastolic dysfunction (impaired relaxation).  2. Right ventricular systolic function is normal. The right ventricular size is normal. There is mildly elevated pulmonary artery systolic pressure.  3. The mitral valve is normal in structure. Trivial mitral valve regurgitation. No evidence of mitral stenosis.  4. The aortic valve is tricuspid. Aortic valve regurgitation is not visualized. No aortic stenosis is present. FINDINGS  Left Ventricle: Left ventricular ejection fraction, by estimation, is 50 to 55%. The left ventricle has low normal function. The left ventricle has no regional wall motion abnormalities. The left ventricular internal cavity size was normal in size. There is moderate left ventricular hypertrophy. Left ventricular diastolic parameters are consistent with Grade I diastolic dysfunction (impaired relaxation). Right Ventricle: The right ventricular size is normal. No increase in right ventricular wall thickness. Right ventricular systolic function is normal. There is mildly elevated pulmonary artery systolic pressure. The tricuspid regurgitant velocity is 2.74  m/s, and with an assumed right atrial pressure of 8 mmHg, the estimated right ventricular systolic pressure is 38.0 mmHg. Left Atrium: Left atrial size was normal in size. Right Atrium: Right atrial size was normal in size. Pericardium: There is no evidence of pericardial effusion. Mitral Valve: The mitral valve is normal in structure. Trivial mitral valve regurgitation. No evidence of  mitral valve stenosis. MV peak gradient, 5.3 mmHg. The mean mitral valve gradient is 2.0 mmHg. Tricuspid Valve: The tricuspid valve is normal in structure. Tricuspid valve regurgitation is trivial. Aortic Valve: The aortic valve is tricuspid.  Aortic valve regurgitation is not visualized. No aortic stenosis is present. Aortic valve mean gradient measures 4.0 mmHg. Aortic valve peak gradient measures 6.2 mmHg. Aortic valve area, by VTI measures 2.39 cm. Pulmonic Valve: The pulmonic valve was normal in structure. Pulmonic valve regurgitation is not visualized. Aorta: The aortic root and ascending aorta are structurally normal, with no evidence of dilitation. IAS/Shunts: The interatrial septum was not well visualized.  LEFT VENTRICLE PLAX 2D LVIDd:         4.50 cm     Diastology LVIDs:         3.30 cm     LV e' medial:    6.63 cm/s LV PW:         1.10 cm     LV E/e' medial:  11.0 LV IVS:        1.00 cm     LV e' lateral:   5.86 cm/s LVOT diam:     1.90 cm     LV E/e' lateral: 12.4 LV SV:         40 LV SV Index:   23 LVOT Area:     2.84 cm  LV Volumes (MOD) LV vol d, MOD A2C: 68.4 ml LV vol d, MOD A4C: 77.5 ml LV vol s, MOD A2C: 33.9 ml LV vol s, MOD A4C: 37.8 ml LV SV MOD A2C:     34.5 ml LV SV MOD A4C:     77.5 ml LV SV MOD BP:      39.2 ml RIGHT VENTRICLE             IVC RV S prime:     13.90 cm/s  IVC diam: 1.50 cm TAPSE (M-mode): 1.6 cm LEFT ATRIUM             Index        RIGHT ATRIUM           Index LA diam:        3.00 cm 1.76 cm/m   RA Area:     10.40 cm LA Vol (A2C):   23.2 ml 13.65 ml/m  RA Volume:   20.40 ml  12.00 ml/m LA Vol (A4C):   19.2 ml 11.29 ml/m LA Biplane Vol: 21.2 ml 12.47 ml/m  AORTIC VALVE AV Area (Vmax):    2.85 cm AV Area (Vmean):   2.41 cm AV Area (VTI):     2.39 cm AV Vmax:           124.00 cm/s AV Vmean:          93.450 cm/s AV VTI:            0.166 m AV Peak Grad:      6.2 mmHg AV Mean Grad:      4.0 mmHg LVOT Vmax:         124.50 cm/s LVOT Vmean:        79.500 cm/s LVOT VTI:           0.140 m LVOT/AV VTI ratio: 0.84  AORTA Ao Root diam: 3.30 cm Ao Asc diam:  2.80 cm MITRAL VALVE               TRICUSPID VALVE MV Area (PHT): 4.06 cm    TR Peak grad:   30.0 mmHg MV Area VTI:   1.72 cm    TR Vmax:        274.00 cm/s MV Peak grad:  5.3 mmHg MV Mean grad:  2.0 mmHg  SHUNTS MV Vmax:       1.15 m/s    Systemic VTI:  0.14 m MV Vmean:      68.2 cm/s   Systemic Diam: 1.90 cm MV Decel Time: 187 msec MV E velocity: 72.90 cm/s MV A velocity: 89.60 cm/s MV E/A ratio:  0.81 Epifanio Lesches MD Electronically signed by Epifanio Lesches MD Signature Date/Time: 06/17/2022/10:47:05 AM    Final    CT VENOGRAM HEAD  Result Date: 06/17/2022 CLINICAL DATA:  Intracranial hemorrhage. EXAM: CT VENOGRAM HEAD TECHNIQUE: Venographic phase images of the brain were obtained following the administration of intravenous contrast. Multiplanar reformats and maximum intensity projections were generated. RADIATION DOSE REDUCTION: This exam was performed according to the departmental dose-optimization program which includes automated exposure control, adjustment of the mA and/or kV according to patient size and/or use of iterative reconstruction technique. CONTRAST:  75mL OMNIPAQUE IOHEXOL 350 MG/ML SOLN COMPARISON:  None Available. FINDINGS: Filling defect of the right transverse sinus and right transverse sigmoid junction which are somewhat large and tubular for arachnoid granulations, although an arachnoid granulation at the left transverse sigmoid junction appears nearly similar. The location would also not explain the acute hemorrhage. No visible cortical or central venous thrombosis. IMPRESSION: No cortical or central venous thrombosis to explain the ICH. There are filling defects at the right transverse and transverse sigmoid dural sinuses which are prominent for arachnoid granulations but not definitive for thrombus. Attention at follow-up imaging. Electronically Signed   By: Tiburcio Pea M.D.   On:  06/17/2022 05:30   CT ANGIO HEAD NECK W WO CM  Result Date: 06/17/2022 CLINICAL DATA:  Found unresponsive. Intracranial hemorrhage. History of cocaine use EXAM: CT ANGIOGRAPHY HEAD AND NECK WITH AND WITHOUT CONTRAST TECHNIQUE: Multidetector CT imaging of the head and neck was performed using the standard protocol during bolus administration of intravenous contrast. Multiplanar CT image reconstructions and MIPs were obtained to evaluate the vascular anatomy. Carotid stenosis measurements (when applicable) are obtained utilizing NASCET criteria, using the distal internal carotid diameter as the denominator. RADIATION DOSE REDUCTION: This exam was performed according to the departmental dose-optimization program which includes automated exposure control, adjustment of the mA and/or kV according to patient size and/or use of iterative reconstruction technique. CONTRAST:  75mL OMNIPAQUE IOHEXOL 350 MG/ML SOLN COMPARISON:  Head CT from earlier today FINDINGS: CTA NECK FINDINGS Aortic arch: Unremarkable Right carotid system: Mixed density plaque at the bifurcation with some low-density plaque bulging into the ICA bulb. No ulceration or flow limiting stenosis Left carotid system: Mainly low-density plaque at the bifurcation without stenosis or ulceration. Vertebral arteries: No proximal subclavian stenosis. The vertebral arteries are somewhat tortuous but smoothly contoured and diffusely patent Skeleton: No acute or aggressive finding. Other neck: No acute finding Upper chest: Mild opacification of dependent lungs which could be atelectasis. Centrilobular emphysema. Review of the MIP images confirms the above findings CTA HEAD FINDINGS Anterior circulation: No aneurysm or spot sign seen underlying the ICH. There is a aneurysm projecting leftward from the left carotid terminus which measures 4 mm. No adjacent hemorrhage. No major branch occlusion. There is undulation of the bilateral intracranial branches which is likely  atheromatous given findings in the neck. No segmental beading. Some less intense flow and right MCA branches is likely related to mass effect by the ICH. Posterior circulation: The vertebral and basilar arteries arediffusely patent. Undulation of the left more than right PCA with up to moderate narrowing on the left, likely atheromatous given the findings in  the neck. Venous sinuses: Reference dedicated CT venogram Anatomic variants: No acute finding Review of the MIP images confirms the above findings IMPRESSION: 1. No vascular lesion or spot sign seen at the acute ICH. 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 3. 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 Electronically Signed   By: Tiburcio Pea M.D.   On: 06/17/2022 05:18   CT Head Wo Contrast  Result Date: 06/17/2022 CLINICAL DATA:  Altered mental status EXAM: CT HEAD WITHOUT CONTRAST TECHNIQUE: Contiguous axial images were obtained from the base of the skull through the vertex without intravenous contrast. RADIATION DOSE REDUCTION: This exam was performed according to the departmental dose-optimization program which includes automated exposure control, adjustment of the mA and/or kV according to patient size and/or use of iterative reconstruction technique. COMPARISON:  None Available. FINDINGS: Brain: There is a large area of parenchymal hemorrhage identified in the right cerebral hemisphere centered primarily within the basal ganglia and extending superiorly into the centrum semi ovale. This measures approximately 4.7 x 4.0 cm in greatest AP and transverse dimensions respectively. It extends for approximately 4.7 cm in craniocaudad dimension. Significant mass-effect upon the right lateral ventricle is noted. Mild midline shift of approximately 5 mm is noted. Some surrounding edema is seen as well. No other hemorrhage is noted. Relative loss of the sulcal markings is noted likely related to some increased intracranial  pressure. Vascular: No hyperdense vessel or unexpected calcification. Skull: Normal. Negative for fracture or focal lesion. Sinuses/Orbits: No acute finding. Other: None. IMPRESSION: Right-sided intraparenchymal hemorrhage as described with associated edema and mild midline shift of 5 mm from right to left. Critical Value/emergent results were called by telephone at the time of interpretation on 06/17/2022 at 2:27 am to Dr. Donna Bernard , who verbally acknowledged these results. Electronically Signed   By: Alcide Clever M.D.   On: 06/17/2022 02:29   DG Abdomen 1 View  Result Date: 06/17/2022 CLINICAL DATA:  Check gastric catheter placement EXAM: ABDOMEN - 1 VIEW COMPARISON:  None Available. FINDINGS: Gastric catheter is noted extending into the stomach. Proximal side port lies at the gastroesophageal junction. This should be advanced further into the stomach. No free air is seen. No bony abnormality is noted. IMPRESSION: Gastric catheter as described. Electronically Signed   By: Alcide Clever M.D.   On: 06/17/2022 02:16   DG Chest Port 1 View  Result Date: 06/17/2022 CLINICAL DATA:  Check endotracheal tube placement EXAM: PORTABLE CHEST 1 VIEW COMPARISON:  10/21/2020 FINDINGS: Cardiac shadow is within normal limits. Endotracheal tube is noted 1.5 cm above the carina. Gastric catheter is noted extending into the stomach. Patchy increased density is noted in the right middle lobe beneath the minor fissure consistent with acute infiltrate. No sizable effusion is noted. No bony abnormality is seen. IMPRESSION: Right middle lobe infiltrate. Tubes and lines as described. Electronically Signed   By: Alcide Clever M.D.   On: 06/17/2022 02:16     PHYSICAL EXAM  Temp:  [97.9 F (36.6 C)-99.3 F (37.4 C)] 98.6 F (37 C) (05/20 1200) Pulse Rate:  [75-103] 84 (05/20 1217) Resp:  [17-30] 20 (05/20 1217) BP: (103-165)/(66-101) 111/73 (05/20 1200) SpO2:  [91 %-100 %] 96 % (05/20 1217) FiO2 (%):  [40 %] 40 % (05/20  1217) Weight:  [64.4 kg] 64.4 kg (05/20 0355)   General:  She is intubated, sedated and on mechanical ventilation. Neuro: Eyes are closed.  Unresponsive.  She will blink her eyes to threat.   Marland Kitchen  Does not open eyes to command, unable to nod yes or no to questions.  Will open eyes to stimuli but not track examiner.  Pupils are 2 mm and sluggish blink to threat on the right and on the left.  Does not track examiner.  Oculocephalic intact.  Gag cough intact.  Corneals positive on the right absent on the left.  No movement in the left arm or leg.  She withdraws slightly in the right arm and leg to painful stimuli.  She was not able to grip my fingers or consistently follow commands on the right side.    ASSESSMENT/PLAN Ms. FAELYN SPEER is a 47 y.o. female with history of cocaine abuse admitted for unresponsiveness.  Intubated in ER for airway protection.  No tPA given due to ICH.    ICH:  right BG and CR ICH, still more likely due to cocaine induced hypertension vs. 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 Vasospasm from ICH vs. 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  2D Echo EF 50 to 55% LDL 65 HgbA1c 5.9 UDS positive for cocaine Heparin subcu for VTE prophylaxis No  antithrombotic prior to admission, now on No antithrombotic due to ICH. Therapy recommendations: Pending Disposition: Pending, prognosis poor, palliative care on board. Family meeting held again today.  Continue full support and care.  Cerebral edema CT right BG and CR large ICH, 5 mm midline shift CT repeat 5/12 stable ICH with leftward midline shift of 5 mm CT repeat 5/13 unchanged appearance of right ICH with adjacent edema. Midline shift is 5 mm. Na 151->155->157->159-> 155->158->150->159->162->161->159->158->155 -> 154>153 off 3% saline Na monitoring LTM EEG cortical dysfunction arising from right hemisphere with diffuse encephalopathy, no seizures  Respiratory failure Aspiration pneumonia Leukocytosis Pneumothorax  Intubated on vent Still on fentanyl, low dose CCM on board CXR right middle lobe infiltrate On azithromycin and Rocephin -> unasyn and zyvox Tmax 100.6->100.3->afebrile->100.3 -> 101.9 WBC 22.2->20.1->15.8->14.0->13.5->15.0 -> 16.5>20 S/p chest tube  Hypertension BP unstable Off cleviprex Labetalol and hydralazine IV PRN BP goal < 160 Long term BP goal normotensive Due to increased creatinine we will hold off on lisinopril for now.  Can use as needed to control blood pressure.  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  Hospital day # 10 Patient is sedated and intubated and on ventilatory support for respiratory failure.  Plan is to do elective tracheostomy this afternoon.  No family  available at the bedside.   Discussed with Dr. Celine Mans critical care team.This patient is critically ill and at significant risk of neurological worsening, death and care requires constant monitoring of vital signs, hemodynamics,respiratory and cardiac monitoring, extensive review of multiple databases, frequent neurological assessment, discussion with family, other specialists and medical decision making of high complexity.I have made any additions or clarifications directly to the above note.This critical care time does not reflect procedure time, or teaching time or supervisory time of PA/NP/Med Resident etc but could involve care discussion time.  I spent 30 minutes of neurocritical care time  in the care of  this patient.      To contact Stroke Continuity provider, please refer to [email or web address]. After hours, contact General Neurology

## 2022-06-27 NOTE — Progress Notes (Signed)
[facility] ADULT ICU REPLACEMENT PROTOCOL   The patient does apply for the [facility] Adult ICU Electrolyte Replacment Protocol based on the criteria listed below:   1.Exclusion criteria: TCTS, ECMO, Dialysis, and Myasthenia Gravis patients 2. Is GFR >/= 30 ml/min? Yes.    Patient's GFR today is >60 3. Is SCr </= 2? Yes.   Patient's SCr is 0.86 mg/dL 4. Did SCr increase >/= 0.5 in 24 hours? No. 5.Pt's weight >40kg  Yes.   6. Abnormal electrolyte(s): K+3.3  7. Electrolytes replaced per protocol 8.  Call MD STAT for K+ </= 2.5, Phos </= 1, or Mag </= 1 Physician:  Dr. Mechele Collin  Rebecca [name] 06/27/2022 5:16 AM

## 2022-06-27 NOTE — Progress Notes (Addendum)
NAME:  Rebecca [name], MRN:  [id number], DOB:  Jun 30, 1975, LOS: 9 ADMISSION DATE:  06/17/2022, CONSULTATION DATE:  06/26/22 REFERRING MD:  EDP, CHIEF COMPLAINT:  unresponsive   History of Present Illness:  47 yo female with hx of polysubstance abuse presented brought to [facility] ED with AMS.  Intubated for airway protection.  CT head showed large Rt ICH with 5 mm midline shift.  UDS positive for cocaine.  Transferred to [facility] for further management.  PCCM consulted to assist with management in ICU.   Hx from chart and medical team.  Pertinent  Medical History  Substance abuse  Significant Hospital Events: Including procedures, antibiotic start and stop dates in addition to other pertinent events   5/10 Admit, neurosurgery consulted 5/11 remains on ventilator with hypertonic saline and EEG in place. CVC placed.  5/12 no acute events overnight MRI brain 5/12 >> multiple acute infarcts in various distributions consistent with central embolic process, most affected right MCA territory.  Right basal ganglia hematoma.  Dural venous sinus thrombosis in the right sigmoid sinus extending up into the upper right IJ.  Mild dilation temporal horn left lateral ventricle with 5 mm of midline shift 5/19 completed Unasyn. Family meeting w/ palliative "wants to pursue an aggressive path of care inclusve of a tracheostomy, gastrostomy tube, and long term placement. She feels that more time is needed".   Interim History / Subjective:   Failed SBT this morning  Objective   Blood pressure 115/73, pulse 80, temperature 99.7 F (37.6 C), resp. rate 20, height 5\' 5"  (1.651 m), weight 62.8 kg, SpO2 94 %. CVP:  [0 mmHg-10 mmHg] 1 mmHg  Vent Mode: PSV;CPAP FiO2 (%):  [40 %] 40 % Set Rate:  [18 bmp] 18 bmp Vt Set:  [480 mL] 480 mL PEEP:  [8 cmH20] 8 cmH20 Pressure Support:  [5 cmH20] 5 cmH20 Plateau Pressure:  [18 cmH20-21 cmH20] 21 cmH20   Intake/Output Summary (Last 24 hours) at 06/26/2022 0940 Last data  filed at 06/26/2022 4098 Gross per 24 hour  Intake 2658.33 ml  Output 750 ml  Net 1908.33 ml   Filed Weights   06/23/22 0500 06/24/22 0500 06/25/22 0500  Weight: 61.6 kg 61.4 kg 62.8 kg   Physical exam  General this is a 47 year old female patient who remains profoundly weak and on full mechanical support HEENT normocephalic atraumatic orally intubated, seemingly favoring the left side with concern for neck contracture Pulmonary: Coarse scattered rhonchi, chest tube is in place.  I see no airleak Portable chest x-ray the endotracheal tube is in satisfactory position.  The left chest tubes in place.  No clear pneumothorax noted.  Improving aeration Cardiac: Regular rate and rhythm Extremities: Warm dry dependent edema Abdomen soft nontender Neuro: Opens eyes to request, will follow commands weak.  Plegic on left GU: Clear yellow. Resolved Hospital Problem list    Urinary tract infection Treated with CTX x 4 days, SVT - resolved  Hypertensive Emergency  Assessment & Plan:   Acute right basal ganglia intraparenchymal hemorrhage Cerebral edema with brain compression R MCA Stroke and B ACA punctate infarcts Acute R Sigmoid sinus thrombosis and IJ DVST Induced hypernatremia and hyperchloremia -Now off hypertonic saline - Note poor prognosis, suspect she will remain plegic on the left.  Question whether she will be able to protect her airway Plan No anticoagulation due to basal ganglia bleeding Will need trach/PEG etc  Acute Hypoxic Respiratory Failure Bilateral multifocal pneumonia with MRSA L Pneumothorax s/p chest tube  Mental status has been primary barrier to extubation Plan Continuing chest tube to suction while on positive pressure Pressure support ventilation as tolerated, not candidate for extubation at this point VAP bundle PAD protocol Completed Unasyn, currently on day number 6 of 8 linezolid Will need tracheostomy; get CXR after   Pre-renal AKI - 2/2  diuresis Progressive renal insufficiency 5/18, serum creatinine improved following holding Lasix Plan Renal dose medications Strict intake output AM chemistry    Demand cardiac ischemia with elevated troponin w/ New HFrEF, HTN - LVEF 50-55% this admission High-sensitivity troponin peaked at 708. Plan Continue telemetry Continue Norvasc 10 mg daily Add low-dose carvedilol As needed hydralazine  Intermittent fluid and electrolyte imbalance: Hyponatremia (was on hypertonic saline), hypokalemia, hyperchloremia Plan Replace potassium Allow Na to normalize (will add free water)   Anemia of critical illness - stable Plan Intermittent CBC Trigger for transfusion is less than 7 hemoglobin  Best Practice:   Diet/type: tubefeeds DVT prophylaxis: SCD GI prophylaxis: PPI Lines: N/A Foley:  Yes, and it is still needed Code Status:  full code Last date of multidisciplinary goals of care discussion: Appreciate palliative care input.  Significant discussions on 5/18.    Critical care time: 33 minutes   Simonne Martinet ACNP-BC [facility] Pulmonary/Critical Care Pager # [phone] OR # [phone] if no answer

## 2022-06-27 NOTE — Progress Notes (Signed)
Daughter updated via phone.  Plan of care discussed Consent for trach later today   Simonne Martinet ACNP-BC [facility] Pulmonary/Critical Care Pager # [phone] OR # [phone] if no answer

## 2022-06-27 NOTE — Procedures (Cosign Needed Addendum)
Percutaneous Tracheostomy Procedure Note   Rebecca [name]  [id number]  November 14, 1975  Date:06/27/22  Time:3:11 PM   Provider Performing:Pete E Tanja Port  Procedure: Percutaneous Tracheostomy with Bronchoscopic Guidance (40981) Premedicated w/ 200 fent, 2 versed, 20 etomidate and 80mg  Roc  Indication(s) Vent weaning   Consent Risks of the procedure as well as the alternatives and risks of each were explained to the patient and/or caregiver.  Consent for the procedure was obtained.  Anesthesia Etomidate, Versed, Fentanyl, Vecuronium   Time Out Verified patient identification, verified procedure, site/side was marked, verified correct patient position, special equipment/implants available, medications/allergies/relevant history reviewed, required imaging and test results available.   Sterile Technique Maximal sterile technique including sterile barrier drape, hand hygiene, sterile gown, sterile gloves, mask, hair covering.    Procedure Description Appropriate anatomy identified by palpation.  Patient's neck prepped and draped in sterile fashion.  1% lidocaine with epinephrine was used to anesthetize skin overlying neck.  1.5cm incision made and blunt dissection performed until tracheal rings could be easily palpated.   Then a size 6 Shiley tracheostomy was placed under bronchoscopic visualization using usual Seldinger technique and serial dilation.   Bronchoscope confirmed placement above the carina.  Tracheostomy was sutured in place with adhesive pad to protect skin under pressure.    Patient connected to ventilator.   Complications/Tolerance Small arterial bleed on anterior stoma at 12 o'clock tied off successfully w/ good hemostasis  Chest X-ray is ordered to confirm no post-procedural complication.   EBL Minimal   Specimen(s) None

## 2022-06-28 ENCOUNTER — Inpatient Hospital Stay (HOSPITAL_COMMUNITY): Payer: Medicaid Other

## 2022-06-28 DIAGNOSIS — I610 Nontraumatic intracerebral hemorrhage in hemisphere, subcortical: Secondary | ICD-10-CM | POA: Diagnosis not present

## 2022-06-28 DIAGNOSIS — G9340 Encephalopathy, unspecified: Secondary | ICD-10-CM | POA: Diagnosis not present

## 2022-06-28 DIAGNOSIS — J9601 Acute respiratory failure with hypoxia: Secondary | ICD-10-CM | POA: Diagnosis not present

## 2022-06-28 DIAGNOSIS — Z9689 Presence of other specified functional implants: Secondary | ICD-10-CM | POA: Diagnosis not present

## 2022-06-28 LAB — CBC
HCT: 28.5 % — ABNORMAL LOW (ref 36.0–46.0)
Hemoglobin: 8.2 g/dL — ABNORMAL LOW (ref 12.0–15.0)
MCH: 21.9 pg — ABNORMAL LOW (ref 26.0–34.0)
MCHC: 28.8 g/dL — ABNORMAL LOW (ref 30.0–36.0)
MCV: 76 fL — ABNORMAL LOW (ref 80.0–100.0)
Platelets: 433 10*3/uL — ABNORMAL HIGH (ref 150–400)
RBC: 3.75 MIL/uL — ABNORMAL LOW (ref 3.87–5.11)
RDW: 21.1 % — ABNORMAL HIGH (ref 11.5–15.5)
WBC: 11.5 10*3/uL — ABNORMAL HIGH (ref 4.0–10.5)
nRBC: 0.2 % (ref 0.0–0.2)

## 2022-06-28 LAB — CULTURE, BLOOD (ROUTINE X 2): Culture: NO GROWTH

## 2022-06-28 LAB — BASIC METABOLIC PANEL
Anion gap: 11 (ref 5–15)
BUN: 26 mg/dL — ABNORMAL HIGH (ref 6–20)
CO2: 22 mmol/L (ref 22–32)
Calcium: 8.6 mg/dL — ABNORMAL LOW (ref 8.9–10.3)
Chloride: 116 mmol/L — ABNORMAL HIGH (ref 98–111)
Creatinine, Ser: 0.8 mg/dL (ref 0.44–1.00)
GFR, Estimated: >60 mL/min (ref >60)
Glucose, Bld: 125 mg/dL — ABNORMAL HIGH (ref 70–99)
Potassium: 3.5 mmol/L (ref 3.5–5.1)
Sodium: 149 mmol/L — ABNORMAL HIGH (ref 135–145)

## 2022-06-28 LAB — GLUCOSE, CAPILLARY: Glucose-Capillary: 117 mg/dL — ABNORMAL HIGH (ref 70–99)

## 2022-06-28 MED ORDER — POTASSIUM CHLORIDE 20 MEQ PO PACK
40.0000 meq | PACK | Freq: Once | ORAL | Status: AC
  Administered 2022-06-28: 40 meq
  Filled 2022-06-28: qty 2

## 2022-06-28 MED ORDER — FENTANYL CITRATE PF 50 MCG/ML IJ SOSY
50.0000 ug | PREFILLED_SYRINGE | INTRAMUSCULAR | Status: DC | PRN
  Administered 2022-06-28: 50 ug via INTRAVENOUS
  Filled 2022-06-28 (×2): qty 1

## 2022-06-28 NOTE — Progress Notes (Addendum)
NAME:  Rebecca [name], MRN:  [id number], DOB:  09-Dec-1975, LOS: 11 ADMISSION DATE:  06/17/2022, CONSULTATION DATE:  06/28/22 REFERRING MD:  EDP, CHIEF COMPLAINT:  unresponsive   History of Present Illness:  47 yo female with hx of polysubstance abuse presented brought to [facility] ED with AMS.  Intubated for airway protection.  CT head showed large Rt ICH with 5 mm midline shift.  UDS positive for cocaine.  Transferred to [facility] for further management.  PCCM consulted to assist with management in ICU.   Hx from chart and medical team.  Pertinent  Medical History  Substance abuse  Significant Hospital Events: Including procedures, antibiotic start and stop dates in addition to other pertinent events   5/10 Admit, neurosurgery consulted 5/11 remains on ventilator with hypertonic saline and EEG in place. CVC placed.  5/12 no acute events overnight MRI brain 5/12 >> multiple acute infarcts in various distributions consistent with central embolic process, most affected right MCA territory.  Right basal ganglia hematoma.  Dural venous sinus thrombosis in the right sigmoid sinus extending up into the upper right IJ.  Mild dilation temporal horn left lateral ventricle with 5 mm of midline shift 5/19 completed Unasyn. Family meeting w/ palliative "wants to pursue an aggressive path of care inclusive of a tracheostomy, gastrostomy tube, and long term placement. She feels that more time is needed".   Interim History / Subjective:  Uncomfortable appearing overnight.  Perc trach yesterday    Objective   Blood pressure (!) 157/83, pulse (!) 116, temperature (!) 101.1 F (38.4 C), temperature source Axillary, resp. rate (!) 29, height 5\' 5"  (1.651 m), weight 65.4 kg, SpO2 100 %.    Vent Mode: PRVC FiO2 (%):  [40 %-100 %] 50 % Set Rate:  [18 bmp] 18 bmp Vt Set:  [480 mL] 480 mL PEEP:  [8 cmH20-10 cmH20] 10 cmH20 Plateau Pressure:  [20 cmH20-22 cmH20] 20 cmH20   Intake/Output Summary (Last 24 hours)  at 06/28/2022 0851 Last data filed at 06/28/2022 0800 Gross per 24 hour  Intake 2069.94 ml  Output 480 ml  Net 1589.94 ml   Filed Weights   06/25/22 0500 06/27/22 0355 06/28/22 0500  Weight: 62.8 kg 64.4 kg 65.4 kg   Physical exam  General:  acutely ill appearing female, NAD on vent  HEENT: MM pink/moist, fresh trach c/d  Neuro:  withdraws to pain on R, few episodes of following simple commands but none for me, L hemiplegia  CV: s1s2 rrr, no m/r/g PULM:  resps even non labored on vent, peep 10, diminished bases, few scattered rhonchi  GI: soft, bsx4 active  Extremities: warm/dry, no sig edema  Skin: no rashes or lesions  Resolved Hospital Problem list    Urinary tract infection Treated with CTX x 4 days, SVT - resolved  Hypertensive Emergency  Assessment & Plan:   Acute right basal ganglia intraparenchymal hemorrhage Cerebral edema with brain compression R MCA Stroke and B ACA punctate infarcts Acute R Sigmoid sinus thrombosis and IJ DVST Induced hypernatremia and hyperchloremia -s/p hypertonic saline - Note poor prognosis, suspect she will remain plegic on the left.  Question whether she will be able to protect her airway Plan No anticoagulation due to basal ganglia bleeding S/p perc trach  Will need PEG - will reach out to schedule   Acute Hypoxic Respiratory Failure Bilateral multifocal pneumonia with MRSA L Pneumothorax s/p chest tube  Mental status has been primary barrier to extubation Plan Continuing chest tube to suction  while on positive pressure Pressure support ventilation as tolerated, s/p perc trach 5/20 Wean peep as able  VAP bundle PAD protocol Completed Unasyn, currently on day 7/8 linezolid stop date 5/22  Pre-renal AKI - improving  Hypernatremia  Plan Renal dose medications Strict intake output F/u chem  Continue free water   Demand cardiac ischemia with elevated troponin w/ New HFrEF, HTN - LVEF 50-55% this admission High-sensitivity  troponin peaked at 708. Plan Continue telemetry Continue Norvasc 10 mg, carvedilol  PRN hydralazine  Intermittent fluid and electrolyte imbalance: Hyponatremia (was on hypertonic saline), hypokalemia, hyperchloremia Plan Replace potassium PRN Continue free water as above  F/u chem   Anemia of critical illness - stable Plan Intermittent CBC Trigger for transfusion is hgb <7 Continue SQ heparin   Best Practice:   Diet/type: tubefeeds DVT prophylaxis: prophylactic heparin  GI prophylaxis: PPI Lines: N/A Foley:  Yes, and it is still needed Code Status:  full code Last date of multidisciplinary goals of care discussion: Appreciate palliative care input.  Significant discussions on 5/18.    Critical care time: 31 minutes   Dirk Dress, NP Pulmonary/Critical Care Medicine  06/28/2022  8:51 AM

## 2022-06-28 NOTE — Progress Notes (Signed)
eLink Physician-Brief Progress Note Patient Name: Rebecca [name] DOB: 02/14/1975 MRN: [id number]   Date of Service  06/28/2022  HPI/Events of Note  47 year old female brought in by EMS unresponsive, intubated on 5/10 and subsequently trached on 5/20 in the setting of right-sided intraparenchymal hemorrhage with limited neurological recovery.  Patient has been more tachycardic, uncomfortable appearing.  eICU Interventions  Add on fentanyl given potential pain     Intervention Category Intermediate Interventions: Pain - evaluation and management  Laurana Magistro 06/28/2022, 6:21 AM

## 2022-06-28 NOTE — Progress Notes (Signed)
Inpatient Rehab Admissions Coordinator:   Per therapy recommendations, patient was screened for CIR candidacy by Megan Salon, MS, CCC-SLP . At this time, Pt. Is total A and not tolerating OOB. Currently, she does not demonstrate ability to tolerate intensity of CIR; however,  Pt. may have potential to progress to becoming a potential CIR candidate, so CIR admissions team will follow and monitor for progress and participation with therapies and place consult order if Pt. appears to be an appropriate candidate. Please contact me with any questions.    Megan Salon, MS, CCC-SLP Rehab Admissions Coordinator  [phone] (celll) [phone] (office)

## 2022-06-28 NOTE — Evaluation (Signed)
Occupational Therapy Evaluation Patient Details Name: Rebecca [name] MRN: [id number] DOB: April 19, 1975 Today's Date: 06/28/2022   History of Present Illness 47 yo female with hx of polysubstance abuse presented 5/10 to [facility] ED with AMS.  Intubated 5/10 for airway protection.  CT head showed large Rt ICH with 5 mm midline shift.  UDS positive for cocaine.  Transferred to [facility] for further management.  Chest tube 5/13.  Trach 5/20.   Clinical Impression   Rebecca [name] was evaluated s/p the above admission list. PLOF is unknown, per chart pt is unhoused at baseline. Upon evaluation she was limited by lethargy and low LOA, poor balance, purposeful participation, impaired vision, L hemibody sensory motor deficits, limited movement of R hemi body and new trach>vent. Overall she needs total A +2 fro all aspects of her care. Pt did tolerate supported sitting EOB with increased in arousal once upright with eyes open ~50% of the time. She also intermittently followed commands to kick her R leg and squeeze the R hand, no postural righting reactions noted. VSS with 50% FiO2 and 10 PEEP, 2 100% breaths given in sitting due to elevated RR. Pt will benefit from continued acute OT services.        Recommendations for follow up therapy are one component of a multi-disciplinary discharge planning process, led by the attending physician.  Recommendations may be updated based on patient status, additional functional criteria and insurance authorization.   Assistance Recommended at Discharge Frequent or constant Supervision/Assistance  Patient can return home with the following A lot of help with walking and/or transfers;Two people to help with walking and/or transfers;A lot of help with bathing/dressing/bathroom;Two people to help with bathing/dressing/bathroom;Assistance with cooking/housework;Assistance with feeding;Direct supervision/assist for medications management;Direct supervision/assist for financial  management;Assist for transportation;Help with stairs or ramp for entrance    Functional Status Assessment  Patient has had a recent decline in their functional status and demonstrates the ability to make significant improvements in function in a reasonable and predictable amount of time.  Equipment Recommendations  None recommended by OT    Recommendations for Other Services Rehab consult     Precautions / Restrictions Precautions Precautions: Fall Precaution Comments: Chest tube, trach on vent, cortrak Restrictions Weight Bearing Restrictions: No      Mobility Bed Mobility Overal bed mobility: Needs Assistance Bed Mobility: Supine to Sit     Supine to sit: Total assist, +2 for physical assistance          Transfers                   General transfer comment: NT      Balance Overall balance assessment: Needs assistance Sitting-balance support: Bilateral upper extremity supported, No upper extremity supported, Feet supported Sitting balance-Leahy Scale: Zero Sitting balance - Comments: Pt did not elicit balance reactions today when sitting with total assist for 15 minutes. Pt possibly with slight trunk activation 1 x.  Moved right UE spontaneously and squeezed PT hand to command. Pt kicked right LE to command when awake.  Pt had periods of lethargy in sitting. Postural control: Posterior lean, Right lateral lean, Left lateral lean                                 ADL either performed or assessed with clinical judgement   ADL Overall ADL's : Needs assistance/impaired Eating/Feeding: NPO  General ADL Comments: total A for all aspects of care     Vision   Vision Assessment?: Vision impaired- to be further tested in functional context Additional Comments: eyes intermittently open with positional changes. Blinks to threat on the R     Perception Perception Perception Tested?: No   Praxis  Praxis Praxis tested?: Not tested    Pertinent Vitals/Pain Pain Assessment Pain Assessment: Faces Pain Location: withdrawl to noxios stim on R side Pain Descriptors / Indicators: Grimacing Pain Intervention(s): Monitored during session     Hand Dominance     Extremity/Trunk Assessment Upper Extremity Assessment Upper Extremity Assessment: LUE deficits/detail;RUE deficits/detail RUE Deficits / Details: 2/5 for gross grasp, trace activation noted in biceps and forearm activation. otherwise difficult to assess RUE Sensation: decreased light touch;decreased proprioception RUE Coordination: decreased fine motor;decreased gross motor LUE Deficits / Details: spontaneous trace activation noted in sitting otherwise flaccid. no response to noxious stim LUE Sensation: decreased light touch;decreased proprioception   Lower Extremity Assessment Lower Extremity Assessment: Defer to PT evaluation RLE Deficits / Details: Trace quad activation to command. No other active movement LLE Deficits / Details: No active movement noted spontaneously or to command   Cervical / Trunk Assessment Cervical / Trunk Assessment: Other exceptions Cervical / Trunk Exceptions: foreward head - no core activation   Communication Communication Communication: Tracheostomy   Cognition Arousal/Alertness: Lethargic Behavior During Therapy: Flat affect Overall Cognitive Status: Difficult to assess                                 General Comments: pt intermittently follows simple movement commands when sitting upright for incrased arousal. No attempt at communication or head nodding     General Comments  Trach on vent 50%FiO2 with 10 PEEP, RR to 40 x 2 and needed to do 100% O2 x 2; other VSS.    Exercises     Shoulder Instructions      Home Living Family/patient expects to be discharged to:: Shelter/Homeless                                        Prior Functioning/Environment  Prior Level of Function : Independent/Modified Independent                        OT Problem List: Decreased strength;Decreased range of motion;Decreased activity tolerance;Impaired balance (sitting and/or standing);Impaired vision/perception;Decreased cognition;Decreased safety awareness;Decreased knowledge of use of DME or AE;Decreased knowledge of precautions;Cardiopulmonary status limiting activity;Impaired sensation;Impaired UE functional use      OT Treatment/Interventions: Self-care/ADL training;Neuromuscular education;DME and/or AE instruction;Therapeutic exercise;Therapeutic activities;Patient/family education;Balance training    OT Goals(Current goals can be found in the care plan section) Acute Rehab OT Goals Patient Stated Goal: unable to state OT Goal Formulation: Patient unable to participate in goal setting Time For Goal Achievement: 07/12/22 Potential to Achieve Goals: Good ADL Goals Pt Will Perform Grooming: with max assist Additional ADL Goal #1: pt will complete bed mobility with max A as a precursor to ADLs Additional ADL Goal #2: Pt will follow 50% of commands to actively participate in ADL task  OT Frequency: Min 1X/week    Co-evaluation PT/OT/SLP Co-Evaluation/Treatment: Yes Reason for Co-Treatment: Complexity of the patient's impairments (multi-system involvement);For patient/therapist safety PT goals addressed during session: Mobility/safety with mobility OT goals addressed during session:  ADL's and self-care;Strengthening/ROM      AM-PAC OT "6 Clicks" Daily Activity     Outcome Measure Help from another person eating meals?: Total Help from another person taking care of personal grooming?: Total Help from another person toileting, which includes using toliet, bedpan, or urinal?: Total Help from another person bathing (including washing, rinsing, drying)?: Total Help from another person to put on and taking off regular upper body clothing?:  Total Help from another person to put on and taking off regular lower body clothing?: Total 6 Click Score: 6   End of Session Equipment Utilized During Treatment: Oxygen Nurse Communication: Mobility status  Activity Tolerance: Patient limited by lethargy Patient left: in bed;with call bell/phone within reach;with bed alarm set  OT Visit Diagnosis: Unsteadiness on feet (R26.81);Other abnormalities of gait and mobility (R26.89);Muscle weakness (generalized) (M62.81);Hemiplegia and hemiparesis Hemiplegia - Right/Left: Left Hemiplegia - caused by: Cerebral infarction                Time: 1610-9604 OT Time Calculation (min): 32 min Charges:  OT General Charges $OT Visit: 1 Visit OT Evaluation $OT Eval Moderate Complexity: 1 Mod  Derenda Mis, OTR/L Acute Rehabilitation Services Office [phone] Secure Chat Communication Preferred   Donia Pounds 06/28/2022, 12:43 PM

## 2022-06-28 NOTE — Progress Notes (Signed)
Pt transported on vent to CT3 and back to 3M03 w/o any complications.

## 2022-06-28 NOTE — Progress Notes (Signed)
STROKE TEAM PROGRESS NOTE   SUBJECTIVE (INTERVAL HISTORY) Patient tracheostomy yesterday.  She is still on ventilatory support and is not weaning.  Mental status has fluctuated at times she is arousable and follows commands and at times is unresponsive.  She had CT scan of the abdomen today which was suboptimal due to motion artifact and lack of contrast.  Dependent bilateral lower lung lobe opacities were still present.  There is no family at the bedside.  She remains on antibiotics for pneumonia.   PEG tube has been ordered.  CCM plan to start weaning trials. OBJECTIVE Temp:  [99 F (37.2 C)-101.1 F (38.4 C)] 99 F (37.2 C) (05/21 1100) Pulse Rate:  [71-116] 97 (05/21 1530) Cardiac Rhythm: Sinus tachycardia (05/21 0730) Resp:  [18-34] 25 (05/21 1530) BP: (99-174)/(61-101) 140/85 (05/21 1530) SpO2:  [90 %-100 %] 100 % (05/21 1530) FiO2 (%):  [40 %-100 %] 40 % (05/21 1523) Weight:  [65.4 kg] 65.4 kg (05/21 0500)  Recent Labs  Lab 06/25/22 0750 06/25/22 1151 06/25/22 1530 06/26/22 1952 06/28/22 0737  GLUCAP 97 87 102* 112* 117*   Recent Labs  Lab 06/24/22 0358 06/25/22 0349 06/26/22 0955 06/27/22 0355 06/28/22 1003  NA 154* 153* 154* 151* 149*  K 3.5 3.6 3.5 3.3* 3.5  CL 119* 116* 120* 118* 116*  CO2 25 27 26 26 22   GLUCOSE 96 133* 120* 104* 125*  BUN 35* 43* 33* 30* 26*  CREATININE 1.15* 1.64* 1.00 0.86 0.80  CALCIUM 8.6* 8.3* 8.6* 8.4* 8.6*  MG  --   --   --  2.5*  --   PHOS  --   --   --  3.7  --    No results for input(s): "AST", "ALT", "ALKPHOS", "BILITOT", "PROT", "ALBUMIN" in the last 168 hours.  Recent Labs  Lab 06/23/22 0256 06/24/22 0358 06/25/22 0349 06/27/22 0355 06/28/22 1003  WBC 15.0* 16.5* 20.7* 12.4* 11.5*  HGB 8.6* 8.5* 7.6* 7.3* 8.2*  HCT 29.7* 29.5* 26.3* 25.2* 28.5*  MCV 75.4* 74.3* 75.8* 76.1* 76.0*  PLT 305 351 354 380 433*   No results for input(s): "CKTOTAL", "CKMB", "CKMBINDEX", "TROPONINI" in the last 168 hours.  No results for  input(s): "LABPROT", "INR" in the last 72 hours. No results for input(s): "COLORURINE", "LABSPEC", "PHURINE", "GLUCOSEU", "HGBUR", "BILIRUBINUR", "KETONESUR", "PROTEINUR", "UROBILINOGEN", "NITRITE", "LEUKOCYTESUR" in the last 72 hours.  Invalid input(s): "APPERANCEUR"     Component Value Date/Time   CHOL 126 06/18/2022 0242   TRIG 123 06/21/2022 0407   HDL 45 06/18/2022 0242   CHOLHDL 2.8 06/18/2022 0242   VLDL 16 06/18/2022 0242   LDLCALC 65 06/18/2022 0242   Lab Results  Component Value Date   HGBA1C 5.9 (H) 06/18/2022      Component Value Date/Time   LABOPIA NONE DETECTED 06/17/2022 0206   COCAINSCRNUR POSITIVE (A) 06/17/2022 0206   LABBENZ NONE DETECTED 06/17/2022 0206   AMPHETMU NONE DETECTED 06/17/2022 0206   THCU NONE DETECTED 06/17/2022 0206   LABBARB NONE DETECTED 06/17/2022 0206    No results for input(s): "ETH" in the last 168 hours.   CT ABDOMEN WO CONTRAST  Result Date: 06/28/2022 CLINICAL DATA:  Dysphagia EXAM: CT ABDOMEN WITHOUT CONTRAST TECHNIQUE: Multidetector CT imaging of the abdomen was performed following the standard protocol without IV contrast. RADIATION DOSE REDUCTION: This exam was performed according to the departmental dose-optimization program which includes automated exposure control, adjustment of the mA and/or kV according to patient size and/or use of iterative reconstruction technique. COMPARISON:  X-ray 06/27/2022. FINDINGS: Lower chest: Breathing motion. There is a left-sided pigtail pleural catheter. Minimal left-sided pleural effusion. There is some dependent opacities along both lung bases, left-greater-than-right. Atelectasis is favored over infiltrate. Hepatobiliary: On this non IV contrast exam, the liver has some small low-attenuation lesions. Example in segment 4 is 1 of the larger foci measuring 12 mm consistent with a benign cystic lesion. Other lesions are less than a cm. No specific imaging follow-up. Gallbladder is contracted. Pancreas:  Grossly of the pancreas is preserved. Poorly defined on this examination. Spleen: The spleen is nonenlarged. Adrenals/Urinary Tract: The adrenal glands are preserved. 3 mm nonobstructing lower pole right-sided renal stone. Punctate midportion left-sided renal stone. Stomach/Bowel: Enteric tube in place with tip extending to the pylorus. The stomach has some moderate fluid but is nondilated. Limited evaluation of the bowel with overlapping artifacts. There are a few prominent loops of small bowel left midabdomen. The visualized large bowel is nondilated Vascular/Lymphatic: Grossly normal caliber aorta and IVC. Evaluation for lymph nodes is limited by the artifacts. No obvious lymph node enlargement. Other: Mild central mesenteric stranding. Anasarca. Of note there is motion as well as artifacts from patient's arms being scanned at the patient's side. Musculoskeletal: No acute or significant osseous findings. IMPRESSION: Limited examination due to motion artifact and lack of contrast. Enteric tube in place with tip near the pylorus. There are some prominent loops of small bowel left midabdomen with some stranding but appearance is nonspecific and poorly defined. Overall with the history and these findings a contrast examination may be useful with the contrast examination. Oral and/or IV would be useful. Nonobstructing renal stones. Catheter in the left lung base. Dependent bilateral lower lobe lung opacities, left-greater-than-right. Trace left pleural fluid. Electronically Signed   By: Karen Kays M.D.   On: 06/28/2022 11:38   DG CHEST PORT 1 VIEW  Result Date: 06/28/2022 CLINICAL DATA:  [id number] Chest tube in place [id number] EXAM: PORTABLE CHEST 1 VIEW COMPARISON:  CXR 06/27/22 FINDINGS: Tracheostomy terminates approximately 7 cm above the carina. Weighted enteric tube courses below diaphragm with the tip out field of view. Left basilar pleural drainage catheter in place No radiographically discernible  pneumothorax. No large pleural effusion. Unchanged cardiac and mediastinal contours. Linear opacity in the right lung base is favored to represent atelectasis. Hazy opacity at the left lung base could represent atelectasis or infection. No radiographically apparent displaced rib fractures. Visualized upper abdomen is unremarkable. IMPRESSION: Left basilar pleural drainage catheter in place. No radiographically discernible pneumothorax or large pleural effusion. There is a hazy opacity at the left lung base could represent atelectasis or infection. Electronically Signed   By: Lorenza Cambridge M.D.   On: 06/28/2022 08:08   DG Chest Port 1 View  Result Date: 06/27/2022 CLINICAL DATA:  Hypoxia EXAM: PORTABLE CHEST 1 VIEW COMPARISON:  Previous studies including the examination done earlier today FINDINGS: Tip of tracheostomy is 7.4 cm above the carina. Enteric tube is noted traversing the esophagus. Left chest tube is noted with its tip in the medial left lower lung field. There is interval decrease in opacity in the left lower lung fields suggesting decreasing pleural effusion. There are linear densities in both lower lung fields suggesting atelectasis/pneumonia. There are no signs of alveolar pulmonary edema. There is no pneumothorax. IMPRESSION: There is decrease in amount of left pleural effusion. Linear densities are seen in medial aspects of both lower lung fields suggesting atelectasis/pneumonia. Electronically Signed   By: Harlan Stains.D.  On: 06/27/2022 17:34   DG Chest Port 1 View  Result Date: 06/27/2022 CLINICAL DATA:  Post tracheostomy. EXAM: PORTABLE CHEST 1 VIEW COMPARISON:  06/23/2022 FINDINGS: Feeding tube is in place, tip beyond the edge of the image at least to the level of the stomach. A pigtail type catheter is identified at the LEFT lung base, stable in appearance. There has been interval placement of tracheostomy tube, tip approximately 5.3 centimeters above the carina. There  continues to be dense opacification of the LEFT lung base. No evidence for pulmonary edema. No pneumothorax. IMPRESSION: 1. Interval placement of tracheostomy tube. 2. Persistent dense opacification of the LEFT lung base. Electronically Signed   By: Norva Pavlov M.D.   On: 06/27/2022 15:26   DG Abd Portable 1V  Result Date: 06/27/2022 CLINICAL DATA:  Evaluate feeding tube placement. EXAM: PORTABLE ABDOMEN - 1 VIEW COMPARISON:  06/24/2022 FINDINGS: Percutaneous pigtail thoracostomy tube is again noted overlying the left lower chest. Interval placement of feeding tube with tip below the level of the GE junction in the expected location of the body of stomach. Similar appearance of gaseous distension of the colon. IMPRESSION: Interval placement of feeding tube with tip below the level of the GE junction in the expected location of the body of stomach. Electronically Signed   By: Signa Kell M.D.   On: 06/27/2022 12:45   DG Abd 1 View  Result Date: 06/24/2022 CLINICAL DATA:  OG tube placement EXAM: ABDOMEN - 1 VIEW COMPARISON:  06/24/2022 FINDINGS: OG tube is in place with the tip in the fundus of the stomach. Left basilar chest tube remains in place. Left base atelectasis. IMPRESSION: OG tube tip in the fundus of the stomach. Electronically Signed   By: Charlett Nose M.D.   On: 06/24/2022 03:43   DG Abd 1 View  Result Date: 06/24/2022 CLINICAL DATA:  [id number] Encounter for imaging study to confirm orogastric (OG) tube placement [id number] EXAM: ABDOMEN - 1 VIEW COMPARISON:  X-ray abdomen 06/18/2022 FINDINGS: Enteric tube with tip and side port overlying the expected location of the gastric lumen. Left upper lobe pigtail. Gaseous distension of the large bowel. No radio-opaque calculi or other significant radiographic abnormality are seen. IMPRESSION: Enteric tube in good position. Electronically Signed   By: Tish Frederickson M.D.   On: 06/24/2022 02:06   DG CHEST PORT 1 VIEW  Result Date:  06/23/2022 CLINICAL DATA:  [id number] Pneumothorax [id number] EXAM: PORTABLE CHEST 1 VIEW COMPARISON:  Jun 21, 2022 FINDINGS: The cardiomediastinal silhouette is unchanged in contour.LEFT-sided chest tube. LEFT neck CVC tip terminates over the SVC. ETT tip terminates 6.7 cm above the carina. Enteric tube tip and side port project over the esophagus. Favored trace LEFT pleural effusion. No significant pneumothorax. Bibasilar atelectasis. IMPRESSION: 1. Favored trace LEFT pleural effusion. No significant pneumothorax. 2. Enteric tube tip and side port project over the esophagus. Recommend advancement. Electronically Signed   By: Meda Klinefelter M.D.   On: 06/23/2022 14:00   DG Chest Port 1 View  Result Date: 06/21/2022 CLINICAL DATA:  Left pneumothorax EXAM: PORTABLE CHEST 1 VIEW COMPARISON:  Previous studies including the examination done earlier today FINDINGS: There is interval decrease in size of small left apical pneumothorax. There is tiny residual pneumothorax in the medial left apex in the current study. Cardiac size is within normal limits. There are no signs of pulmonary edema or new focal infiltrates. Costophrenic angles are clear. Tip of left chest tube is seen in the medial left lower lung  field. Tip of NG tube is seen in the fundus of the stomach. Tip of left IJ central venous catheter is seen in superior vena cava. Tip of endotracheal tube is 5.5 cm above the carina. IMPRESSION: There is interval decrease in size of left pneumothorax with tiny residual pneumothorax in the medial left apex. There are no new infiltrates or signs of pulmonary edema. Electronically Signed   By: Ernie Avena M.D.   On: 06/21/2022 13:24   DG CHEST PORT 1 VIEW  Result Date: 06/21/2022 CLINICAL DATA:  Pneumothorax EXAM: PORTABLE CHEST 1 VIEW COMPARISON:  CXR 06/20/22 FINDINGS: Left-sided pleural pigtail drainage catheter in place with unchanged positioning. There is a small left apical pneumothorax, likely decreased in  size compared prior exam. Left-sided central venous catheter with unchanged positioning. Enteric tube courses below diaphragm with the tip out of the field of view. Endotracheal tube terminates approximately 5 cm above the carina. No pleural effusion. No focal airspace opacity. Unchanged cardiac and mediastinal contours. No radiographically apparent new displaced rib fractures. Visualized upper abdomen is unremarkable. IMPRESSION: Small left apical pneumothorax, likely decreased in size compared to prior exam. Thoracostomy tube in place. Electronically Signed   By: Lorenza Cambridge M.D.   On: 06/21/2022 10:01   DG Chest Port 1 View  Result Date: 06/20/2022 CLINICAL DATA:  Chest tube placement. EXAM: PORTABLE CHEST 1 VIEW COMPARISON:  One-view chest x-ray 06/20/2022 at 12:25 p.m. FINDINGS: The heart is enlarged. Endotracheal tube is stable. Left IJ catheter is stable. Gastric tube terminates in the fundus the stomach. A left sided pleural pigtail catheter was placed. The pneumothorax is reduced but not eliminated. IMPRESSION: 1. Interval placement of left-sided pleural pigtail catheter with reduction in left-sided pneumothorax. 2. Stable cardiomegaly without failure. Electronically Signed   By: Marin Roberts M.D.   On: 06/20/2022 17:50   DG CHEST PORT 1 VIEW  Addendum Date: 06/20/2022   ADDENDUM REPORT: 06/20/2022 16:05 ADDENDUM: Critical Value/emergent results were called by telephone at the time of interpretation on 06/20/2022 at 4:03 pm to provider Priscella Mann, RN, who verbally acknowledged these results. Electronically Signed   By: Signa Kell M.D.   On: 06/20/2022 16:05   Result Date: 06/20/2022 CLINICAL DATA:  Hypoxia. EXAM: PORTABLE CHEST 1 VIEW COMPARISON:  06/18/2018 for FINDINGS: ETT tip is stable above the carina. There is an enteric tube with tip coursing below the field of view. Left IJ catheter tip is in the distal SVC. There is a new moderate left-sided pneumothorax overlying the  apex and lateral basal left lung. No signs of pleural effusion or edema. Persistent opacities within the right middle lobe. IMPRESSION: 1. New moderate left-sided pneumothorax. 2. Stable support apparatus. 3. Persistent right middle lobe opacities. Electronically Signed: By: Signa Kell M.D. On: 06/20/2022 15:41   CT HEAD WO CONTRAST ( )  Result Date: 06/20/2022 CLINICAL DATA:  Neuro deficit with acute stroke suspected EXAM: CT HEAD WITHOUT CONTRAST TECHNIQUE: Contiguous axial images were obtained from the base of the skull through the vertex without intravenous contrast. RADIATION DOSE REDUCTION: This exam was performed according to the departmental dose-optimization program which includes automated exposure control, adjustment of the mA and/or kV according to patient size and/or use of iterative reconstruction technique. COMPARISON:  Yesterday FINDINGS: Brain: Large ICH centered at the right basal ganglia and adjacent white matter, with tracking towards the right brainstem is unchanged in shape and size, measuring up to 5 cm anterior to posterior and 4 cm craniocaudal. Regional edema is unchanged.  Extensive cortical infarct in the right posterior frontal and parietal lobe. Smaller but more apparent left parietal cortex infarcts since brain MRI yesterday. Midline shift is 5 mm. Vascular: No hyperdense vessel or unexpected calcification. Skull: Normal. Negative for fracture or focal lesion. Sinuses/Orbits: No acute finding. IMPRESSION: 1. Multiple infarcts by brain MRI yesterday, suspect progressive cortex infarction in the left parietal lobe. 2. Unchanged appearance of right ICH with adjacent edema. Midline shift is 5 mm. Electronically Signed   By: Tiburcio Pea M.D.   On: 06/20/2022 06:09   CT HEAD WO CONTRAST ( )  Result Date: 06/19/2022 CLINICAL DATA:  Follow-up ICH EXAM: CT HEAD WITHOUT CONTRAST TECHNIQUE: Contiguous axial images were obtained from the base of the skull through the vertex  without intravenous contrast. RADIATION DOSE REDUCTION: This exam was performed according to the departmental dose-optimization program which includes automated exposure control, adjustment of the mA and/or kV according to patient size and/or use of iterative reconstruction technique. COMPARISON:  MRI from earlier today. FINDINGS: Brain: Large acute hemorrhage centered at the right basal ganglia and adjacent white matter without progression in size or shape when compared to prior. Cytotoxic edema most apparent in the right frontal parietal cortex, infarcts being under appreciated when compared to preceding brain MRI. Diffuse effacement of subarachnoid spaces, leftward midline shift of 5 mm. Unchanged mild dilatation of the left lateral ventricle. Vascular: No hyperdense vessel or unexpected calcification. Skull: Normal. Negative for fracture or focal lesion. Sinuses/Orbits: No acute finding. IMPRESSION: Right ICH and extensive cortical infarction that is stable from brain MRI earlier today. No new abnormality. Leftward midline shift of 5 mm. Electronically Signed   By: Tiburcio Pea M.D.   On: 06/19/2022 11:13   MR BRAIN W WO CONTRAST  Result Date: 06/19/2022 CLINICAL DATA:  Hemorrhagic stroke workup EXAM: MRI HEAD WITHOUT AND WITH CONTRAST TECHNIQUE: Multiplanar, multiecho pulse sequences of the brain and surrounding structures were obtained without and with intravenous contrast. CONTRAST:  6mL GADAVIST GADOBUTROL 1 MMOL/ML IV SOLN COMPARISON:  Head CT and CTA from 2 days prior FINDINGS: Brain: Multiple areas of restricted diffusion most confluent in the right parietal and posterior temporal cortex. Restricted diffusion is seen surrounding the large and known right basal ganglia hemorrhage which measures up to 5.3 cm anterior to posterior with cleft of fluid surrounding the hematoma. Infarcts in other arterial distributions are also present, including along the left cerebral convexity (especially  parietooccipital)and at the right brainstem. Midline shift measures 5 mm. There is periventricular FLAIR hyperintensity suggesting chronic small vessel disease. There is asymmetric mild dilatation of the left temporal horn. Vascular: Filling defect in the right sigmoid sinus and upper internal jugular vein. Skull and upper cervical spine: Normal marrow signal Sinuses/Orbits: Mild mucosal thickening in the mastoid air cells. Retention cysts in the right maxillary sinus. Diffuse mucosal thickening in the paranasal sinuses. Negative orbits. IMPRESSION: 1. 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 The most confluent area is in the right MCA territory with infarct likely underlying the right basal ganglia hematoma. 2. 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 The pattern of infarcts and hemorrhage do not correlate with the clot location however. 3. Mild dilatation of the temporal horn of the left lateral ventricle. 5 mm of midline shift. Electronically Signed   By: Tiburcio Pea M.D.   On: 06/19/2022 06:59   DG Abd 1 View  Result Date: 06/18/2022 CLINICAL DATA:  Check gastric  catheter placement EXAM: ABDOMEN - 1 VIEW COMPARISON:  Film from earlier in the same day. FINDINGS: Gastric catheter has been advanced and now lies completely within the stomach. Scattered large and small bowel gas is noted. IMPRESSION: Gastric catheter within the stomach as described. Electronically Signed   By: Alcide Clever M.D.   On: 06/18/2022 23:47   DG Abd 1 View  Result Date: 06/18/2022 CLINICAL DATA:  Feeding tube placement. EXAM: ABDOMEN - 1 VIEW COMPARISON:  Jun 18, 2022 (6:44 p.m.) FINDINGS: A nasogastric tube is seen with its distal tip overlying the expected region of the body of the stomach. The distal side hole sits at the level of the gastroesophageal junction. The bowel gas pattern is normal. No radio-opaque calculi or  other significant radiographic abnormality are seen. IMPRESSION: Nasogastric tube positioning, as described above. Further advancement of the NG tube by approximately 7 cm is recommended to decrease the risk of aspiration. Electronically Signed   By: Aram Candela M.D.   On: 06/18/2022 22:23   DG Abd Portable 1V  Result Date: 06/18/2022 CLINICAL DATA:  Check gastric catheter placement EXAM: PORTABLE ABDOMEN - 1 VIEW COMPARISON:  None Available. FINDINGS: Scattered large and small bowel gas is noted. Gastric catheter is noted with the tip in the stomach. Proximal side port lies in the distal esophagus. This should be advanced several cm deeper into the stomach. IMPRESSION: Gastric catheter as described. This should be advanced deeper into the stomach. Electronically Signed   By: Alcide Clever M.D.   On: 06/18/2022 21:24   DG CHEST PORT 1 VIEW  Result Date: 06/18/2022 CLINICAL DATA:  Evaluate central line placement EXAM: PORTABLE CHEST 1 VIEW COMPARISON:  Jun 18, 2022 FINDINGS: The side port of the NG tube is near the GE junction. The distal tip is in the stomach. The ETT is in good position. A left central line is been placed in the interval terminating in the central SVC. No pneumothorax. The left lung is clear. Right middle lobe infiltrate remains. No other interval changes. IMPRESSION: 1. Support apparatus as above. The side port of the NG tube is near the GE junction. Recommend advancing the tube. 2. The left lung is clear. Right middle lobe infiltrate remains. 3. No other interval changes. Electronically Signed   By: Gerome Sam III M.D.   On: 06/18/2022 17:20   Overnight EEG with video  Result Date: 06/18/2022 Charlsie Quest, MD     06/19/2022  9:37 AM Patient Name: RAFELITA BROSSMAN MRN: [id number] Epilepsy Attending: Charlsie Quest Referring Physician/Provider: Erick Blinks, MD Duration: 06/17/2022 1215 to 06/18/2022 1215 Patient history: 47 y.o. female with hx of cocaine use p/w  unresponsive and large R IPH with brain compression and 5mm midline shift. EEG to evaluate for seizure Level of alertness: Awake, asleep AEDs during EEG study: None Technical aspects: This EEG study was done with scalp electrodes positioned according to the 10-20 International system of electrode placement. Electrical activity was reviewed with band pass filter of 1-70Hz , sensitivity of 7 uV/mm, display speed of 41mm/sec with a 60Hz  notched filter applied as appropriate. EEG data were recorded continuously and digitally stored.  Video monitoring was available and reviewed as appropriate. Description: The posterior dominant rhythm consists of 7.5 Hz activity of moderate voltage (25-35 uV) seen predominantly in posterior head regions, symmetric and reactive to eye opening and eye closing. Sleep was characterized by vertex waves, sleep spindles (12 to 14 Hz), maximal frontocentral region. EEG showed continuous 3  to 6 Hz theta-delta slowing in right hemisphere, maximal right temporal region. Intermittent generalized 3-6hz  theta-delta slowing was also noted. Hyperventilation and photic stimulation were not performed.   ABNORMALITY - Continuous slow, right hemisphere, maximal right temporal region - Intermittent slow, generalized IMPRESSION: This study is suggestive of cortical dysfunction arising from right hemisphere, maximal right temporal region likely secondary to underlying structural abnormality. Additionally there is mild to moderate diffuse encephalopathy. No seizures or epileptiform discharges were seen throughout the recording. Charlsie Quest   DG Chest Port 1 View  Result Date: 06/18/2022 CLINICAL DATA:  Pulmonary infiltrates EXAM: PORTABLE CHEST 1 VIEW COMPARISON:  06/17/2022 FINDINGS: Endotracheal tube is in stable position. NG tube tip in the proximal stomach with the side port near the GE junction. The NG tube appears to be buckled/looped within the neck near the thoracic inlet. Heart mediastinal  contours within normal limits. Right middle lobe airspace opacity again noted, stable. No confluent opacity on the left. No effusions or acute bony abnormality. IMPRESSION: Continued right middle lobe infiltrate, unchanged. NG tube appears to be buckled slashed looped in the upper esophagus near the thoracic inlet. Tip is in the proximal stomach. Electronically Signed   By: Charlett Nose M.D.   On: 06/18/2022 07:11   DG Abd 1 View  Result Date: 06/17/2022 CLINICAL DATA:  Nasogastric tube placement. EXAM: ABDOMEN - 1 VIEW COMPARISON:  Radiograph earlier today. FINDINGS: Tip of the enteric tube is below the diaphragm in the stomach, the side port is just at the gastroesophageal junction. Normal upper abdominal bowel gas pattern. IMPRESSION: Tip of the enteric tube below the diaphragm in the stomach, side-port just at the gastroesophageal junction. Electronically Signed   By: Narda Rutherford M.D.   On: 06/17/2022 15:43   ECHOCARDIOGRAM COMPLETE  Result Date: 06/17/2022    ECHOCARDIOGRAM REPORT   Patient Name:   LORRAIN DURU Date of Exam: 06/17/2022 Medical Rec #:  [id number]          Height:       65.0 in Accession #:    [id number]         Weight:       140.0 lb Date of Birth:  01-13-1976          BSA:          1.700 m Patient Age:    46 years           BP:           122/111 mmHg Patient Gender: F                  HR:           102 bpm. Exam Location:  Inpatient Procedure: 2D Echo, Cardiac Doppler and Color Doppler Indications:    Stroke  History:        Patient has no prior history of Echocardiogram examinations.                 ICH, sepsis; Risk Factors:Current Smoker and Substance abuse.  Sonographer:    Wallie Char Referring Phys: [id number] [facility]  Sonographer Comments: Technically challenging study due to limited acoustic windows and echo performed with patient supine and on artificial respirator. IMPRESSIONS  1. Left ventricular ejection fraction, by estimation, is 50 to 55%. The left  ventricle has low normal function. The left ventricle has no regional wall motion abnormalities. There is moderate left ventricular hypertrophy. Left ventricular diastolic parameters are consistent with Grade I diastolic  dysfunction (impaired relaxation).  2. Right ventricular systolic function is normal. The right ventricular size is normal. There is mildly elevated pulmonary artery systolic pressure.  3. The mitral valve is normal in structure. Trivial mitral valve regurgitation. No evidence of mitral stenosis.  4. The aortic valve is tricuspid. Aortic valve regurgitation is not visualized. No aortic stenosis is present. FINDINGS  Left Ventricle: Left ventricular ejection fraction, by estimation, is 50 to 55%. The left ventricle has low normal function. The left ventricle has no regional wall motion abnormalities. The left ventricular internal cavity size was normal in size. There is moderate left ventricular hypertrophy. Left ventricular diastolic parameters are consistent with Grade I diastolic dysfunction (impaired relaxation). Right Ventricle: The right ventricular size is normal. No increase in right ventricular wall thickness. Right ventricular systolic function is normal. There is mildly elevated pulmonary artery systolic pressure. The tricuspid regurgitant velocity is 2.74  m/s, and with an assumed right atrial pressure of 8 mmHg, the estimated right ventricular systolic pressure is 38.0 mmHg. Left Atrium: Left atrial size was normal in size. Right Atrium: Right atrial size was normal in size. Pericardium: There is no evidence of pericardial effusion. Mitral Valve: The mitral valve is normal in structure. Trivial mitral valve regurgitation. No evidence of mitral valve stenosis. MV peak gradient, 5.3 mmHg. The mean mitral valve gradient is 2.0 mmHg. Tricuspid Valve: The tricuspid valve is normal in structure. Tricuspid valve regurgitation is trivial. Aortic Valve: The aortic valve is tricuspid. Aortic valve  regurgitation is not visualized. No aortic stenosis is present. Aortic valve mean gradient measures 4.0 mmHg. Aortic valve peak gradient measures 6.2 mmHg. Aortic valve area, by VTI measures 2.39 cm. Pulmonic Valve: The pulmonic valve was normal in structure. Pulmonic valve regurgitation is not visualized. Aorta: The aortic root and ascending aorta are structurally normal, with no evidence of dilitation. IAS/Shunts: The interatrial septum was not well visualized.  LEFT VENTRICLE PLAX 2D LVIDd:         4.50 cm     Diastology LVIDs:         3.30 cm     LV e' medial:    6.63 cm/s LV PW:         1.10 cm     LV E/e' medial:  11.0 LV IVS:        1.00 cm     LV e' lateral:   5.86 cm/s LVOT diam:     1.90 cm     LV E/e' lateral: 12.4 LV SV:         40 LV SV Index:   23 LVOT Area:     2.84 cm  LV Volumes (MOD) LV vol d, MOD A2C: 68.4 ml LV vol d, MOD A4C: 77.5 ml LV vol s, MOD A2C: 33.9 ml LV vol s, MOD A4C: 37.8 ml LV SV MOD A2C:     34.5 ml LV SV MOD A4C:     77.5 ml LV SV MOD BP:      39.2 ml RIGHT VENTRICLE             IVC RV S prime:     13.90 cm/s  IVC diam: 1.50 cm TAPSE (M-mode): 1.6 cm LEFT ATRIUM             Index        RIGHT ATRIUM           Index LA diam:        3.00 cm 1.76 cm/m  RA Area:     10.40 cm LA Vol (A2C):   23.2 ml 13.65 ml/m  RA Volume:   20.40 ml  12.00 ml/m LA Vol (A4C):   19.2 ml 11.29 ml/m LA Biplane Vol: 21.2 ml 12.47 ml/m  AORTIC VALVE AV Area (Vmax):    2.85 cm AV Area (Vmean):   2.41 cm AV Area (VTI):     2.39 cm AV Vmax:           124.00 cm/s AV Vmean:          93.450 cm/s AV VTI:            0.166 m AV Peak Grad:      6.2 mmHg AV Mean Grad:      4.0 mmHg LVOT Vmax:         124.50 cm/s LVOT Vmean:        79.500 cm/s LVOT VTI:          0.140 m LVOT/AV VTI ratio: 0.84  AORTA Ao Root diam: 3.30 cm Ao Asc diam:  2.80 cm MITRAL VALVE               TRICUSPID VALVE MV Area (PHT): 4.06 cm    TR Peak grad:   30.0 mmHg MV Area VTI:   1.72 cm    TR Vmax:        274.00 cm/s MV Peak grad:   5.3 mmHg MV Mean grad:  2.0 mmHg    SHUNTS MV Vmax:       1.15 m/s    Systemic VTI:  0.14 m MV Vmean:      68.2 cm/s   Systemic Diam: 1.90 cm MV Decel Time: 187 msec MV E velocity: 72.90 cm/s MV A velocity: 89.60 cm/s MV E/A ratio:  0.81 Epifanio Lesches MD Electronically signed by Epifanio Lesches MD Signature Date/Time: 06/17/2022/10:47:05 AM    Final    CT VENOGRAM HEAD  Result Date: 06/17/2022 CLINICAL DATA:  Intracranial hemorrhage. EXAM: CT VENOGRAM HEAD TECHNIQUE: Venographic phase images of the brain were obtained following the administration of intravenous contrast. Multiplanar reformats and maximum intensity projections were generated. RADIATION DOSE REDUCTION: This exam was performed according to the departmental dose-optimization program which includes automated exposure control, adjustment of the mA and/or kV according to patient size and/or use of iterative reconstruction technique. CONTRAST:  75mL OMNIPAQUE IOHEXOL 350 MG/ML SOLN COMPARISON:  None Available. FINDINGS: Filling defect of the right transverse sinus and right transverse sigmoid junction which are somewhat large and tubular for arachnoid granulations, although an arachnoid granulation at the left transverse sigmoid junction appears nearly similar. The location would also not explain the acute hemorrhage. No visible cortical or central venous thrombosis. IMPRESSION: No cortical or central venous thrombosis to explain the ICH. There are filling defects at the right transverse and transverse sigmoid dural sinuses which are prominent for arachnoid granulations but not definitive for thrombus. Attention at follow-up imaging. Electronically Signed   By: Tiburcio Pea M.D.   On: 06/17/2022 05:30   CT ANGIO HEAD NECK W WO CM  Result Date: 06/17/2022 CLINICAL DATA:  Found unresponsive. Intracranial hemorrhage. History of cocaine use EXAM: CT ANGIOGRAPHY HEAD AND NECK WITH AND WITHOUT CONTRAST TECHNIQUE: Multidetector CT imaging of  the head and neck was performed using the standard protocol during bolus administration of intravenous contrast. Multiplanar CT image reconstructions and MIPs were obtained to evaluate the vascular anatomy. Carotid stenosis measurements (when applicable) are obtained utilizing NASCET criteria, using the distal  internal carotid diameter as the denominator. RADIATION DOSE REDUCTION: This exam was performed according to the departmental dose-optimization program which includes automated exposure control, adjustment of the mA and/or kV according to patient size and/or use of iterative reconstruction technique. CONTRAST:  75mL OMNIPAQUE IOHEXOL 350 MG/ML SOLN COMPARISON:  Head CT from earlier today FINDINGS: CTA NECK FINDINGS Aortic arch: Unremarkable Right carotid system: Mixed density plaque at the bifurcation with some low-density plaque bulging into the ICA bulb. No ulceration or flow limiting stenosis Left carotid system: Mainly low-density plaque at the bifurcation without stenosis or ulceration. Vertebral arteries: No proximal subclavian stenosis. The vertebral arteries are somewhat tortuous but smoothly contoured and diffusely patent Skeleton: No acute or aggressive finding. Other neck: No acute finding Upper chest: Mild opacification of dependent lungs which could be atelectasis. Centrilobular emphysema. Review of the MIP images confirms the above findings CTA HEAD FINDINGS Anterior circulation: No aneurysm or spot sign seen underlying the ICH. There is a aneurysm projecting leftward from the left carotid terminus which measures 4 mm. No adjacent hemorrhage. No major branch occlusion. There is undulation of the bilateral intracranial branches which is likely atheromatous given findings in the neck. No segmental beading. Some less intense flow and right MCA branches is likely related to mass effect by the ICH. Posterior circulation: The vertebral and basilar arteries arediffusely patent. Undulation of the left  more than right PCA with up to moderate narrowing on the left, likely atheromatous given the findings in the neck. Venous sinuses: Reference dedicated CT venogram Anatomic variants: No acute finding Review of the MIP images confirms the above findings IMPRESSION: 1. No vascular lesion or spot sign seen at the acute ICH. 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 3. 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 Electronically Signed   By: Tiburcio Pea M.D.   On: 06/17/2022 05:18   CT Head Wo Contrast  Result Date: 06/17/2022 CLINICAL DATA:  Altered mental status EXAM: CT HEAD WITHOUT CONTRAST TECHNIQUE: Contiguous axial images were obtained from the base of the skull through the vertex without intravenous contrast. RADIATION DOSE REDUCTION: This exam was performed according to the departmental dose-optimization program which includes automated exposure control, adjustment of the mA and/or kV according to patient size and/or use of iterative reconstruction technique. COMPARISON:  None Available. FINDINGS: Brain: There is a large area of parenchymal hemorrhage identified in the right cerebral hemisphere centered primarily within the basal ganglia and extending superiorly into the centrum semi ovale. This measures approximately 4.7 x 4.0 cm in greatest AP and transverse dimensions respectively. It extends for approximately 4.7 cm in craniocaudad dimension. Significant mass-effect upon the right lateral ventricle is noted. Mild midline shift of approximately 5 mm is noted. Some surrounding edema is seen as well. No other hemorrhage is noted. Relative loss of the sulcal markings is noted likely related to some increased intracranial pressure. Vascular: No hyperdense vessel or unexpected calcification. Skull: Normal. Negative for fracture or focal lesion. Sinuses/Orbits: No acute finding. Other: None. IMPRESSION: Right-sided intraparenchymal hemorrhage as described with associated edema and  mild midline shift of 5 mm from right to left. Critical Value/emergent results were called by telephone at the time of interpretation on 06/17/2022 at 2:27 am to Dr. Donna Bernard , who verbally acknowledged these results. Electronically Signed   By: Alcide Clever M.D.   On: 06/17/2022 02:29   DG Abdomen 1 View  Result Date: 06/17/2022 CLINICAL DATA:  Check gastric catheter placement EXAM: ABDOMEN - 1 VIEW COMPARISON:  None Available. FINDINGS: Gastric catheter is noted extending into the stomach. Proximal side port lies at the gastroesophageal junction. This should be advanced further into the stomach. No free air is seen. No bony abnormality is noted. IMPRESSION: Gastric catheter as described. Electronically Signed   By: Alcide Clever M.D.   On: 06/17/2022 02:16   DG Chest Port 1 View  Result Date: 06/17/2022 CLINICAL DATA:  Check endotracheal tube placement EXAM: PORTABLE CHEST 1 VIEW COMPARISON:  10/21/2020 FINDINGS: Cardiac shadow is within normal limits. Endotracheal tube is noted 1.5 cm above the carina. Gastric catheter is noted extending into the stomach. Patchy increased density is noted in the right middle lobe beneath the minor fissure consistent with acute infiltrate. No sizable effusion is noted. No bony abnormality is seen. IMPRESSION: Right middle lobe infiltrate. Tubes and lines as described. Electronically Signed   By: Alcide Clever M.D.   On: 06/17/2022 02:16     PHYSICAL EXAM  Temp:  [99 F (37.2 C)-101.1 F (38.4 C)] 99 F (37.2 C) (05/21 1100) Pulse Rate:  [71-116] 97 (05/21 1530) Resp:  [18-34] 25 (05/21 1530) BP: (99-174)/(61-101) 140/85 (05/21 1530) SpO2:  [90 %-100 %] 100 % (05/21 1530) FiO2 (%):  [40 %-100 %] 40 % (05/21 1523) Weight:  [65.4 kg] 65.4 kg (05/21 0500)   General:  She is s/p tracheostomy.  On ventilator.  Not sedated   Neuro: Eyes are closed.  Unresponsive.  She will blink her eyes to threat.   .  Does not open eyes to command, unable to nod yes or no to  questions.  Will barely open eyes to stimuli but not track examiner.  Pupils are 2 mm and sluggish blink to threat on the right and on the left.  Does not track examiner.  Oculocephalic intact.  Gag cough intact.  Corneals positive on the right absent on the left.  No movement in the left arm or leg.  She withdraws slightly in the right arm and leg to painful stimuli.  She was not able to grip my fingers or consistently follow commands on the right side.    ASSESSMENT/PLAN Ms. Rebecca [name] is a 47 y.o. female with history of cocaine abuse admitted for unresponsiveness.  Intubated in ER for airway protection.  No tPA given due to ICH.    ICH:  right BG and CR ICH, still more likely due to cocaine induced hypertension vs. 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 Vasospasm from ICH vs. 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  2D Echo EF 50 to 55% LDL 65 HgbA1c 5.9 UDS positive for cocaine Heparin subcu for VTE prophylaxis No antithrombotic prior to admission, now on No antithrombotic due to ICH. Therapy recommendations: Pending Disposition: Pending, prognosis poor, palliative care on board. Family meeting held again  today.  Continue full support and care.  Cerebral  edema CT right BG and CR large ICH, 5 mm midline shift CT repeat 5/12 stable ICH with leftward midline shift of 5 mm CT repeat 5/13 unchanged appearance of right ICH with adjacent edema. Midline shift is 5 mm. Na 151->155->157->159-> 155->158->150->159->162->161->159->158->155 -> 154>153 off 3% saline Na monitoring LTM EEG cortical dysfunction arising from right hemisphere with diffuse encephalopathy, no seizures  Respiratory failure Aspiration pneumonia Leukocytosis Pneumothorax  Intubated on vent Still on fentanyl, low dose CCM on board CXR right middle lobe infiltrate On azithromycin and Rocephin -> unasyn and zyvox Tmax 100.6->100.3->afebrile->100.3 -> 101.9 WBC 22.2->20.1->15.8->14.0->13.5->15.0 -> 16.5>20 S/p chest tube  Hypertension BP unstable Off cleviprex Labetalol and hydralazine IV PRN BP goal < 160 Long term BP goal normotensive Due to increased creatinine we will hold off on lisinopril for now.  Can use as needed to control blood pressure.  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  Hospital day # 11 Patient is now status post tracheostomy but remains on ventilatory support for respiratory failure..  Plan weaning trials soon.  Neurological exam remains fluctuating.  She is on antibiotics.  PEG tube has been ordered.  No family available at the bedside.  Discussed with her daughter over the phone about her overall prognosis but family is quite clear they want aggressive care including full tracheostomy care and PEG tube  and nursing home placement..    This patient is critically ill and at significant risk of neurological worsening, death and care requires constant monitoring of vital signs, hemodynamics,respiratory and cardiac monitoring, extensive review of multiple databases, frequent neurological assessment, discussion with family, other specialists and medical decision making of high complexity.I have made any additions or clarifications directly to the above note.This critical care time does not reflect procedure time, or teaching time or supervisory time of PA/NP/Med Resident etc but could involve care discussion time.  I spent 30 minutes of neurocritical care time  in the care of  this patient.       To contact Stroke Continuity provider, please refer to [email or web address]. After hours, contact General Neurology

## 2022-06-28 NOTE — Evaluation (Signed)
Physical Therapy Evaluation Patient Details Name: Rebecca BRIGHTWELL MRN: [id number] DOB: 1975-05-26 Today's Date: 06/28/2022  History of Present Illness  47 yo female with hx of polysubstance abuse presented 5/10 to [facility] ED with AMS.  Intubated 5/10 for airway protection.  CT head showed large Rt ICH with 5 mm midline shift.  UDS positive for cocaine.  Transferred to [facility] for further management.  Chest tube 5/13.  Trach 5/20.  Clinical Impression  Pt admitted with above diagnosis. Pt was able to sit EOB for 15 min with total assist and followed a few commands to squeeze right hand and kick right LE. Pt with periods of lethargy throughout.  Will follow and progress pt as able.  Pt currently with functional limitations due to the deficits listed below (see PT Problem List). Pt will benefit from acute skilled PT to increase their independence and safety with mobility to allow discharge.          Recommendations for follow up therapy are one component of a multi-disciplinary discharge planning process, led by the attending physician.  Recommendations may be updated based on patient status, additional functional criteria and insurance authorization.  Follow Up Recommendations       Assistance Recommended at Discharge Frequent or constant Supervision/Assistance  Patient can return home with the following  A lot of help with walking and/or transfers;A lot of help with bathing/dressing/bathroom;Assistance with cooking/housework;Assist for transportation;Help with stairs or ramp for entrance    Equipment Recommendations Other (comment) (TBA)  Recommendations for Other Services  Rehab consult    Functional Status Assessment Patient has had a recent decline in their functional status and demonstrates the ability to make significant improvements in function in a reasonable and predictable amount of time.     Precautions / Restrictions Precautions Precautions: Fall Precaution Comments: Chest tube,  trach on vent Restrictions Weight Bearing Restrictions: No      Mobility  Bed Mobility Overal bed mobility: Needs Assistance Bed Mobility: Supine to Sit     Supine to sit: Total assist, +2 for physical assistance     General bed mobility comments: Needed total assist for coming to EOB and lying down.    Transfers                   General transfer comment: NT    Ambulation/Gait               General Gait Details: NT  Stairs            Wheelchair Mobility    Modified Rankin (Stroke Patients Only)       Balance Overall balance assessment: Needs assistance Sitting-balance support: Bilateral upper extremity supported, No upper extremity supported, Feet supported Sitting balance-Leahy Scale: Zero Sitting balance - Comments: Pt did not elicit balance reactions today when sitting with total assist for 15 minutes. Pt possibly with slight trunk activation 1 x.  Moved right UE spontaneously and squeezed PT hand to command. Pt kicked right LE to command when awake.  Pt had periods of lethargy in sitting. Postural control: Posterior lean, Right lateral lean, Left lateral lean                                   Pertinent Vitals/Pain Pain Assessment Pain Assessment: CPOT Facial Expression: Tense Body Movements: Protection Muscle Tension: Tense, rigid Compliance with ventilator (intubated pts.): Tolerating ventilator or movement Vocalization (extubated pts.): N/A CPOT  Total: 3    Home Living Family/patient expects to be discharged to:: Shelter/Homeless                        Prior Function Prior Level of Function : Independent/Modified Independent                     Hand Dominance        Extremity/Trunk Assessment   Upper Extremity Assessment Upper Extremity Assessment: Defer to OT evaluation    Lower Extremity Assessment Lower Extremity Assessment: RLE deficits/detail;LLE deficits/detail RLE Deficits /  Details: Trace quad activation to command. No other active movement LLE Deficits / Details: No active movement noted spontaneously or to command       Communication   Communication: Tracheostomy  Cognition Arousal/Alertness: Lethargic Behavior During Therapy: Flat affect Overall Cognitive Status: Difficult to assess                                          General Comments General comments (skin integrity, edema, etc.): Trach on vent 50%FiO2 with 10 PEEP, RR to 40 x 2 and needed to do 100% O2 x 2;  other VSS.    Exercises General Exercises - Lower Extremity Long Arc Quad: Right, 5 reps, Seated   Assessment/Plan    PT Assessment Patient needs continued PT services  PT Problem List Decreased activity tolerance;Decreased balance;Decreased strength;Decreased range of motion;Decreased mobility;Decreased knowledge of use of DME;Decreased safety awareness;Decreased knowledge of precautions;Cardiopulmonary status limiting activity       PT Treatment Interventions DME instruction;Gait training;Functional mobility training;Therapeutic activities;Therapeutic exercise;Balance training;Wheelchair mobility training;Patient/family education    PT Goals (Current goals can be found in the Care Plan section)  Acute Rehab PT Goals Patient Stated Goal: unable to state PT Goal Formulation: Patient unable to participate in goal setting Time For Goal Achievement: 07/12/22 Potential to Achieve Goals: Good    Frequency Min 3X/week     Co-evaluation PT/OT/SLP Co-Evaluation/Treatment: Yes Reason for Co-Treatment: Complexity of the patient's impairments (multi-system involvement);For patient/therapist safety PT goals addressed during session: Mobility/safety with mobility         AM-PAC PT "6 Clicks" Mobility  Outcome Measure Help needed turning from your back to your side while in a flat bed without using bedrails?: Total Help needed moving from lying on your back to sitting  on the side of a flat bed without using bedrails?: Total Help needed moving to and from a bed to a chair (including a wheelchair)?: Total Help needed standing up from a chair using your arms (e.g., wheelchair or bedside chair)?: Total Help needed to walk in hospital room?: Total Help needed climbing 3-5 steps with a railing? : Total 6 Click Score: 6    End of Session   Activity Tolerance: Patient limited by fatigue;Patient limited by lethargy Patient left: in bed;with call bell/phone within reach;with bed alarm set Nurse Communication: Mobility status;Need for lift equipment PT Visit Diagnosis: Muscle weakness (generalized) (M62.81)    Time: 1610-9604 PT Time Calculation (min) (ACUTE ONLY): 34 min   Charges:   PT Evaluation $PT Eval Moderate Complexity: 1 Mod          Maricarmen Braziel M,PT Acute Rehab Services [phone]   Bevelyn Buckles 06/28/2022, 11:38 AM

## 2022-06-28 NOTE — TOC Progression Note (Signed)
Transition of Care [facility]) - Progression Note    Patient Details  Name: Rebecca [name] MRN: [id number] Date of Birth: 1975/08/29  Transition of Care [facility]) CM/SW Contact  Tom-Johnson, Hershal Coria, RN Phone Number: 06/28/2022, 5:28 PM  Clinical Narrative:     Patient underwent Tracheostomy placement yesterday 06/27/22. Continues on full Vent support and is not weaning. Plan for Peg placement.  CIR recommended by PT/OT. Per CIR assessment, patient does not demonstrate ability to tolerate intensity of CIR at this time. CIR will follow and   monitor for progress and participation with therapies. CM will continue to follow as patient progresses with care towards discharge.        Expected Discharge Plan and Services                                               Social Determinants of Health (SDOH) Interventions SDOH Screenings   Tobacco Use: High Risk (10/21/2020)    Readmission Risk Interventions     No data to display

## 2022-06-29 DIAGNOSIS — I610 Nontraumatic intracerebral hemorrhage in hemisphere, subcortical: Secondary | ICD-10-CM | POA: Diagnosis not present

## 2022-06-29 LAB — CULTURE, BLOOD (ROUTINE X 2): Special Requests: ADEQUATE

## 2022-06-29 MED ORDER — POTASSIUM CHLORIDE 20 MEQ PO PACK
20.0000 meq | PACK | Freq: Once | ORAL | Status: AC
  Administered 2022-06-29: 20 meq
  Filled 2022-06-29: qty 1

## 2022-06-29 MED ORDER — FUROSEMIDE 10 MG/ML IJ SOLN
20.0000 mg | Freq: Once | INTRAMUSCULAR | Status: AC
  Administered 2022-06-29: 20 mg via INTRAVENOUS
  Filled 2022-06-29: qty 2

## 2022-06-29 MED ORDER — OXYCODONE HCL 5 MG PO TABS: 5.0000 mg | ORAL_TABLET | Freq: Three times a day (TID) | ORAL | Status: DC | PRN

## 2022-06-29 MED ORDER — POTASSIUM CHLORIDE 20 MEQ PO PACK
40.0000 meq | PACK | Freq: Once | ORAL | Status: AC
  Administered 2022-06-29: 40 meq
  Filled 2022-06-29: qty 2

## 2022-06-29 MED ORDER — BANATROL TF EN LIQD
60.0000 mL | Freq: Two times a day (BID) | ENTERAL | Status: DC
  Administered 2022-06-29 – 2022-07-01 (×4): 60 mL
  Filled 2022-06-29 (×4): qty 60

## 2022-06-29 NOTE — Progress Notes (Signed)
SLP Cancellation Note  Patient Details Name: Rebecca [name] MRN: [id number] DOB: Feb 03, 1976   Cancelled treatment:       Reason Eval/Treat Not Completed: Patient not medically ready Patient continues on vent, not weaning and plan for PEG placement per TOC note 5/21. SLP will follow for readiness.  Angela Nevin, MA, CCC-SLP Speech Therapy

## 2022-06-29 NOTE — Progress Notes (Signed)
Nutrition Follow-up  DOCUMENTATION CODES:   Not applicable  INTERVENTION:   Tube Feeding via Cortrak:  Continue Vital 1.5 at 45 ml/hr with Pro-source TF20 60 mL daily TF regimen provides 1700 kcals, 93 g of protein and 821 mL of free water  Continue free water flushes, hypernatremia improving. Current total free water with TF at goal and FWF 200 mL q 4 hours is 2020 mL of free water. Total does not include additional flushes for med administration, tube maintenance, etc  Further recommendations regarding TF regimen post PEG placement  Add Banatrol TF BID   NUTRITION DIAGNOSIS:   Inadequate oral intake related to inability to eat as evidenced by NPO status.  Being addressed via TF  GOAL:   Patient will meet greater than or equal to 90% of their needs  Progressing  MONITOR:   Vent status, Labs, Weight trends, TF tolerance, I & O's  REASON FOR ASSESSMENT:   Ventilator, Consult Enteral/tube feeding initiation and management  ASSESSMENT:   47 year old female with PMHx of polysubstance abuse admitted [facility] ED with AMS and intubated for airway protection, found to have large right intraparenchymal hemorrhage with 5 mm midline shift, UDS positive for cocaine. Transferred to [facility] on 06/17/22.  5/10 Admitted 5/13 Pneumothorax requiring L chest tube 5/19 Family meeting with palliative with plan for aggressive care including trach, G tube and long term placement 5/20 Cortrak placed, Trach placed/Bronch  Pt remains on vent support via trach, chest tube with 70 mL in 24 hours  Noted plan for IR to place G tube  Vital 1.5 at 45 ml/hr with Pro-source daily, Free water flush of 200 mL q 4 hours   +BM today, large type 7. Multiple each day, no FMS  No pressure injuries/skin breakdown per RN skin assessment  Weight trending up the last several days; current wt 68 kg, admit wt 60 kg. Net + per i/O flow sheet but noted multiple (4 within the past 24 hours) unmeasured urine  occurrences   Labs: Sodium 149 (H-trending down), Creatinine wdl, BUN 26 (H-trending down), potassium 3.5 (wdl), phosphorus 3.7 (wdl) Meds: colace, folic acid, MVI with Minerals, thiamine, senna-docusate, KCl    Diet Order:   Diet Order             Diet NPO time specified  Diet effective now                   EDUCATION NEEDS:   No education needs have been identified at this time  Skin:  Skin Assessment: Reviewed RN Assessment  Last BM:  5/22  Height:   Ht Readings from Last 1 Encounters:  06/22/22 5\' 5"  (1.651 m)    Weight:   Wt Readings from Last 1 Encounters:  06/29/22 68 kg    Ideal Body Weight:  56.8 kg  BMI:  Body mass index is 24.95 kg/m.  Estimated Nutritional Needs:   Kcal:  1600-1800  Protein:  85-95 grams  Fluid:  >/= 1.8 L/day or per MD    Romelle Starcher MS, RDN, LDN, CNSC Registered Dietitian 3 Clinical Nutrition RD Pager and On-Call Pager Number Located in [place]

## 2022-06-29 NOTE — Progress Notes (Signed)
Daily Progress Note   Patient Name: Rebecca [name]       Date: 06/29/2022 DOB: [birth year]  Age: 47 y.o. MRN#: [id number] Attending Physician: Josephine Igo, DO Primary Care Physician: Patient, No Pcp Per Admit Date: 06/17/2022  Reason for Consultation/Follow-up: Establishing goals of care  Subjective: Patient had eyes closed in bed. Appears comfortable. No family at bedside.  Length of Stay: 12  Current Medications: Scheduled Meds:   amLODipine  10 mg Per Tube Daily   carvedilol  3.125 mg Per Tube BID WC   Chlorhexidine Gluconate Cloth  6 each Topical Daily   docusate  100 mg Per Tube BID   feeding supplement (PROSource TF20)  60 mL Per Tube Daily   folic acid  1 mg Per Tube Daily   free water  200 mL Per Tube Q4H   heparin injection (subcutaneous)  5,000 Units Subcutaneous Q8H   ipratropium-albuterol  3 mL Nebulization BID   multivitamin with minerals  1 tablet Per Tube Daily   mouth rinse  15 mL Mouth Rinse Q2H   pantoprazole (PROTONIX) IV  40 mg Intravenous QHS   potassium chloride  20 mEq Per Tube Once   senna-docusate  1 tablet Per Tube QHS   sodium chloride flush  10 mL Intrapleural Q8H   thiamine  100 mg Per Tube Daily    Continuous Infusions:  sodium chloride Stopped (06/28/22 1214)   feeding supplement (VITAL 1.5 CAL) Stopped (06/29/22 1030)   linezolid (ZYVOX) IV Stopped (06/29/22 1046)    PRN Meds: sodium chloride, acetaminophen **OR** acetaminophen (TYLENOL) oral liquid 160 mg/5 mL **OR** acetaminophen, hydrALAZINE, mouth rinse, oxyCODONE  Physical Exam Constitutional:      Appearance: She is ill-appearing.  Neck:     Comments: Trach in place Pulmonary:     Comments: ventilator            Vital Signs: BP 130/68   Pulse 65   Temp 99.9 F (37.7  C) (Oral)   Resp (!) 22   Ht 5\' 5"  (1.651 m)   Wt 68 kg   SpO2 99%   BMI 24.95 kg/m  SpO2: SpO2: 99 % O2 Device: O2 Device: Ventilator O2 Flow Rate:    Intake/output summary:  Intake/Output Summary (Last 24 hours) at 06/29/2022 1410 Last data filed at 06/29/2022 1200  Gross per 24 hour  Intake 1802.38 ml  Output 1370 ml  Net 432.38 ml   LBM: Last BM Date : 06/29/22 Baseline Weight: Weight: 60.2 kg (bed scale) Most recent weight: Weight: 68 kg       Palliative Assessment/Data:10%      Patient Active Problem List   Diagnosis Date Noted   Acute respiratory failure with hypoxia (HCC) 06/21/2022   Chest tube in place 06/20/2022   Hemodialysis-associated hypotension 06/18/2022   ICH (intracerebral hemorrhage) (HCC) 06/17/2022   Cocaine abuse (HCC) 05/12/2017   Substance induced mood disorder (HCC) 05/12/2017   Sepsis (HCC) 03/23/2016    Palliative Care Assessment & Plan   Patient Profile: 47 yo female with hx of polysubstance abuse presented brought to [facility] ED with AMS. Identified to have significant R sided ICH. Is receiving care in the ICU as is intubated. Neurologically has a poor prognosis therefore the Palliative care team has been asked to get involved for further conversations.   Assessment: RN states the patient is less responsive today than yesterday. They have been unable to wean her.   Checked in with daughter [name]. Offered ongoing support. She will reach out to [name] if she has questions or needs.  Recommendations/Plan: Full Code/Full scope of care  The PMT will remain peripheral from this point given clarity of goals.  If patient should decline and additional conversations are warranted please do not hesitate to reach out    Goals of Care and Additional Recommendations: Limitations on Scope of Treatment: Full Scope Treatment  Code Status:    Code Status Orders  (From admission, onward)           Start     Ordered   06/26/22 0709  Full code   Continuous       Question:  By:  Answer:  Consent: discussion documented in EHR   06/26/22 0708           Code Status History     Date Active Date Inactive Code Status Order ID Comments User Context   06/25/2022 1654 06/26/2022 0708 DNR [id number]  Ernie Avena, NP Inpatient   06/17/2022 0631 06/25/2022 1653 Full Code [id number]  Erick Blinks, MD Inpatient   03/23/2016 1014 03/24/2016 1746 Full Code [id number]  Arnaldo Natal Inpatient       Prognosis:  Unable to determine  Discharge Planning: To Be Determined  Care plan was discussed with bedside RN  Time spent:  Detailed review of medical records ( labs, imaging, vital signs), medically appropriate exam, documenting clinical information, medication management, coordination of care.   Thank you for allowing the Palliative Medicine Team to assist in the care of this patient.   Sherryll Burger, NP  Please contact Palliative Medicine Team phone at [phone] for questions and concerns.

## 2022-06-29 NOTE — Progress Notes (Signed)
STROKE TEAM PROGRESS NOTE   SUBJECTIVE (INTERVAL HISTORY) Patient had tracheostomy 2 days ago she is still on ventilatory support and is not weaning.   She received fentanyl last night and this morning and is sedated.    There is no family at the bedside.  She remains on antibiotics for pneumonia.   PEG tube has been ordered.  CCM plan to start weaning trials..  Vital signs stable.  Neurological exam unchanged OBJECTIVE Temp:  [99.4 F (37.4 C)-100.3 F (37.9 C)] 99.9 F (37.7 C) (05/22 1110) Pulse Rate:  [56-101] 65 (05/22 1230) Cardiac Rhythm: Normal sinus rhythm (05/22 0730) Resp:  [16-37] 22 (05/22 1230) BP: (102-175)/(55-99) 130/68 (05/22 1230) SpO2:  [98 %-100 %] 99 % (05/22 1230) FiO2 (%):  [40 %] 40 % (05/22 1200) Weight:  [68 kg] 68 kg (05/22 0500)  Recent Labs  Lab 06/25/22 0750 06/25/22 1151 06/25/22 1530 06/26/22 1952 06/28/22 0737  GLUCAP 97 87 102* 112* 117*   Recent Labs  Lab 06/24/22 0358 06/25/22 0349 06/26/22 0955 06/27/22 0355 06/28/22 1003  NA 154* 153* 154* 151* 149*  K 3.5 3.6 3.5 3.3* 3.5  CL 119* 116* 120* 118* 116*  CO2 25 27 26 26 22   GLUCOSE 96 133* 120* 104* 125*  BUN 35* 43* 33* 30* 26*  CREATININE 1.15* 1.64* 1.00 0.86 0.80  CALCIUM 8.6* 8.3* 8.6* 8.4* 8.6*  MG  --   --   --  2.5*  --   PHOS  --   --   --  3.7  --    No results for input(s): "AST", "ALT", "ALKPHOS", "BILITOT", "PROT", "ALBUMIN" in the last 168 hours.  Recent Labs  Lab 06/23/22 0256 06/24/22 0358 06/25/22 0349 06/27/22 0355 06/28/22 1003  WBC 15.0* 16.5* 20.7* 12.4* 11.5*  HGB 8.6* 8.5* 7.6* 7.3* 8.2*  HCT 29.7* 29.5* 26.3* 25.2* 28.5*  MCV 75.4* 74.3* 75.8* 76.1* 76.0*  PLT 305 351 354 380 433*   No results for input(s): "CKTOTAL", "CKMB", "CKMBINDEX", "TROPONINI" in the last 168 hours.  No results for input(s): "LABPROT", "INR" in the last 72 hours. No results for input(s): "COLORURINE", "LABSPEC", "PHURINE", "GLUCOSEU", "HGBUR", "BILIRUBINUR", "KETONESUR",  "PROTEINUR", "UROBILINOGEN", "NITRITE", "LEUKOCYTESUR" in the last 72 hours.  Invalid input(s): "APPERANCEUR"     Component Value Date/Time   CHOL 126 06/18/2022 0242   TRIG 123 06/21/2022 0407   HDL 45 06/18/2022 0242   CHOLHDL 2.8 06/18/2022 0242   VLDL 16 06/18/2022 0242   LDLCALC 65 06/18/2022 0242   Lab Results  Component Value Date   HGBA1C 5.9 (H) 06/18/2022      Component Value Date/Time   LABOPIA NONE DETECTED 06/17/2022 0206   COCAINSCRNUR POSITIVE (A) 06/17/2022 0206   LABBENZ NONE DETECTED 06/17/2022 0206   AMPHETMU NONE DETECTED 06/17/2022 0206   THCU NONE DETECTED 06/17/2022 0206   LABBARB NONE DETECTED 06/17/2022 0206    No results for input(s): "ETH" in the last 168 hours.   CT ABDOMEN WO CONTRAST  Result Date: 06/28/2022 CLINICAL DATA:  Dysphagia EXAM: CT ABDOMEN WITHOUT CONTRAST TECHNIQUE: Multidetector CT imaging of the abdomen was performed following the standard protocol without IV contrast. RADIATION DOSE REDUCTION: This exam was performed according to the departmental dose-optimization program which includes automated exposure control, adjustment of the mA and/or kV according to patient size and/or use of iterative reconstruction technique. COMPARISON:  X-ray 06/27/2022. FINDINGS: Lower chest: Breathing motion. There is a left-sided pigtail pleural catheter. Minimal left-sided pleural effusion. There is some dependent  opacities along both lung bases, left-greater-than-right. Atelectasis is favored over infiltrate. Hepatobiliary: On this non IV contrast exam, the liver has some small low-attenuation lesions. Example in segment 4 is 1 of the larger foci measuring 12 mm consistent with a benign cystic lesion. Other lesions are less than a cm. No specific imaging follow-up. Gallbladder is contracted. Pancreas: Grossly of the pancreas is preserved. Poorly defined on this examination. Spleen: The spleen is nonenlarged. Adrenals/Urinary Tract: The adrenal glands are  preserved. 3 mm nonobstructing lower pole right-sided renal stone. Punctate midportion left-sided renal stone. Stomach/Bowel: Enteric tube in place with tip extending to the pylorus. The stomach has some moderate fluid but is nondilated. Limited evaluation of the bowel with overlapping artifacts. There are a few prominent loops of small bowel left midabdomen. The visualized large bowel is nondilated Vascular/Lymphatic: Grossly normal caliber aorta and IVC. Evaluation for lymph nodes is limited by the artifacts. No obvious lymph node enlargement. Other: Mild central mesenteric stranding. Anasarca. Of note there is motion as well as artifacts from patient's arms being scanned at the patient's side. Musculoskeletal: No acute or significant osseous findings. IMPRESSION: Limited examination due to motion artifact and lack of contrast. Enteric tube in place with tip near the pylorus. There are some prominent loops of small bowel left midabdomen with some stranding but appearance is nonspecific and poorly defined. Overall with the history and these findings a contrast examination may be useful with the contrast examination. Oral and/or IV would be useful. Nonobstructing renal stones. Catheter in the left lung base. Dependent bilateral lower lobe lung opacities, left-greater-than-right. Trace left pleural fluid. Electronically Signed   By: Karen Kays M.D.   On: 06/28/2022 11:38   DG CHEST PORT 1 VIEW  Result Date: 06/28/2022 CLINICAL DATA:  [id number] Chest tube in place [id number] EXAM: PORTABLE CHEST 1 VIEW COMPARISON:  CXR 06/27/22 FINDINGS: Tracheostomy terminates approximately 7 cm above the carina. Weighted enteric tube courses below diaphragm with the tip out field of view. Left basilar pleural drainage catheter in place No radiographically discernible pneumothorax. No large pleural effusion. Unchanged cardiac and mediastinal contours. Linear opacity in the right lung base is favored to represent atelectasis. Hazy  opacity at the left lung base could represent atelectasis or infection. No radiographically apparent displaced rib fractures. Visualized upper abdomen is unremarkable. IMPRESSION: Left basilar pleural drainage catheter in place. No radiographically discernible pneumothorax or large pleural effusion. There is a hazy opacity at the left lung base could represent atelectasis or infection. Electronically Signed   By: Lorenza Cambridge M.D.   On: 06/28/2022 08:08   DG Chest Port 1 View  Result Date: 06/27/2022 CLINICAL DATA:  Hypoxia EXAM: PORTABLE CHEST 1 VIEW COMPARISON:  Previous studies including the examination done earlier today FINDINGS: Tip of tracheostomy is 7.4 cm above the carina. Enteric tube is noted traversing the esophagus. Left chest tube is noted with its tip in the medial left lower lung field. There is interval decrease in opacity in the left lower lung fields suggesting decreasing pleural effusion. There are linear densities in both lower lung fields suggesting atelectasis/pneumonia. There are no signs of alveolar pulmonary edema. There is no pneumothorax. IMPRESSION: There is decrease in amount of left pleural effusion. Linear densities are seen in medial aspects of both lower lung fields suggesting atelectasis/pneumonia. Electronically Signed   By: Ernie Avena M.D.   On: 06/27/2022 17:34   DG Chest Port 1 View  Result Date: 06/27/2022 CLINICAL DATA:  Post tracheostomy. EXAM: PORTABLE CHEST  1 VIEW COMPARISON:  06/23/2022 FINDINGS: Feeding tube is in place, tip beyond the edge of the image at least to the level of the stomach. A pigtail type catheter is identified at the LEFT lung base, stable in appearance. There has been interval placement of tracheostomy tube, tip approximately 5.3 centimeters above the carina. There continues to be dense opacification of the LEFT lung base. No evidence for pulmonary edema. No pneumothorax. IMPRESSION: 1. Interval placement of tracheostomy tube. 2.  Persistent dense opacification of the LEFT lung base. Electronically Signed   By: Norva Pavlov M.D.   On: 06/27/2022 15:26   DG Abd Portable 1V  Result Date: 06/27/2022 CLINICAL DATA:  Evaluate feeding tube placement. EXAM: PORTABLE ABDOMEN - 1 VIEW COMPARISON:  06/24/2022 FINDINGS: Percutaneous pigtail thoracostomy tube is again noted overlying the left lower chest. Interval placement of feeding tube with tip below the level of the GE junction in the expected location of the body of stomach. Similar appearance of gaseous distension of the colon. IMPRESSION: Interval placement of feeding tube with tip below the level of the GE junction in the expected location of the body of stomach. Electronically Signed   By: Signa Kell M.D.   On: 06/27/2022 12:45   DG Abd 1 View  Result Date: 06/24/2022 CLINICAL DATA:  OG tube placement EXAM: ABDOMEN - 1 VIEW COMPARISON:  06/24/2022 FINDINGS: OG tube is in place with the tip in the fundus of the stomach. Left basilar chest tube remains in place. Left base atelectasis. IMPRESSION: OG tube tip in the fundus of the stomach. Electronically Signed   By: Charlett Nose M.D.   On: 06/24/2022 03:43   DG Abd 1 View  Result Date: 06/24/2022 CLINICAL DATA:  [id number] Encounter for imaging study to confirm orogastric (OG) tube placement [id number] EXAM: ABDOMEN - 1 VIEW COMPARISON:  X-ray abdomen 06/18/2022 FINDINGS: Enteric tube with tip and side port overlying the expected location of the gastric lumen. Left upper lobe pigtail. Gaseous distension of the large bowel. No radio-opaque calculi or other significant radiographic abnormality are seen. IMPRESSION: Enteric tube in good position. Electronically Signed   By: Tish Frederickson M.D.   On: 06/24/2022 02:06   DG CHEST PORT 1 VIEW  Result Date: 06/23/2022 CLINICAL DATA:  [id number] Pneumothorax [id number] EXAM: PORTABLE CHEST 1 VIEW COMPARISON:  Jun 21, 2022 FINDINGS: The cardiomediastinal silhouette is unchanged in  contour.LEFT-sided chest tube. LEFT neck CVC tip terminates over the SVC. ETT tip terminates 6.7 cm above the carina. Enteric tube tip and side port project over the esophagus. Favored trace LEFT pleural effusion. No significant pneumothorax. Bibasilar atelectasis. IMPRESSION: 1. Favored trace LEFT pleural effusion. No significant pneumothorax. 2. Enteric tube tip and side port project over the esophagus. Recommend advancement. Electronically Signed   By: Meda Klinefelter M.D.   On: 06/23/2022 14:00   DG Chest Port 1 View  Result Date: 06/21/2022 CLINICAL DATA:  Left pneumothorax EXAM: PORTABLE CHEST 1 VIEW COMPARISON:  Previous studies including the examination done earlier today FINDINGS: There is interval decrease in size of small left apical pneumothorax. There is tiny residual pneumothorax in the medial left apex in the current study. Cardiac size is within normal limits. There are no signs of pulmonary edema or new focal infiltrates. Costophrenic angles are clear. Tip of left chest tube is seen in the medial left lower lung field. Tip of NG tube is seen in the fundus of the stomach. Tip of left IJ central venous catheter is seen  in superior vena cava. Tip of endotracheal tube is 5.5 cm above the carina. IMPRESSION: There is interval decrease in size of left pneumothorax with tiny residual pneumothorax in the medial left apex. There are no new infiltrates or signs of pulmonary edema. Electronically Signed   By: Ernie Avena M.D.   On: 06/21/2022 13:24   DG CHEST PORT 1 VIEW  Result Date: 06/21/2022 CLINICAL DATA:  Pneumothorax EXAM: PORTABLE CHEST 1 VIEW COMPARISON:  CXR 06/20/22 FINDINGS: Left-sided pleural pigtail drainage catheter in place with unchanged positioning. There is a small left apical pneumothorax, likely decreased in size compared prior exam. Left-sided central venous catheter with unchanged positioning. Enteric tube courses below diaphragm with the tip out of the field of view.  Endotracheal tube terminates approximately 5 cm above the carina. No pleural effusion. No focal airspace opacity. Unchanged cardiac and mediastinal contours. No radiographically apparent new displaced rib fractures. Visualized upper abdomen is unremarkable. IMPRESSION: Small left apical pneumothorax, likely decreased in size compared to prior exam. Thoracostomy tube in place. Electronically Signed   By: Lorenza Cambridge M.D.   On: 06/21/2022 10:01   DG Chest Port 1 View  Result Date: 06/20/2022 CLINICAL DATA:  Chest tube placement. EXAM: PORTABLE CHEST 1 VIEW COMPARISON:  One-view chest x-ray 06/20/2022 at 12:25 p.m. FINDINGS: The heart is enlarged. Endotracheal tube is stable. Left IJ catheter is stable. Gastric tube terminates in the fundus the stomach. A left sided pleural pigtail catheter was placed. The pneumothorax is reduced but not eliminated. IMPRESSION: 1. Interval placement of left-sided pleural pigtail catheter with reduction in left-sided pneumothorax. 2. Stable cardiomegaly without failure. Electronically Signed   By: Marin Roberts M.D.   On: 06/20/2022 17:50   DG CHEST PORT 1 VIEW  Addendum Date: 06/20/2022   ADDENDUM REPORT: 06/20/2022 16:05 ADDENDUM: Critical Value/emergent results were called by telephone at the time of interpretation on 06/20/2022 at 4:03 pm to provider Priscella Mann, RN, who verbally acknowledged these results. Electronically Signed   By: Signa Kell M.D.   On: 06/20/2022 16:05   Result Date: 06/20/2022 CLINICAL DATA:  Hypoxia. EXAM: PORTABLE CHEST 1 VIEW COMPARISON:  06/18/2018 for FINDINGS: ETT tip is stable above the carina. There is an enteric tube with tip coursing below the field of view. Left IJ catheter tip is in the distal SVC. There is a new moderate left-sided pneumothorax overlying the apex and lateral basal left lung. No signs of pleural effusion or edema. Persistent opacities within the right middle lobe. IMPRESSION: 1. New moderate left-sided  pneumothorax. 2. Stable support apparatus. 3. Persistent right middle lobe opacities. Electronically Signed: By: Signa Kell M.D. On: 06/20/2022 15:41   CT HEAD WO CONTRAST ( )  Result Date: 06/20/2022 CLINICAL DATA:  Neuro deficit with acute stroke suspected EXAM: CT HEAD WITHOUT CONTRAST TECHNIQUE: Contiguous axial images were obtained from the base of the skull through the vertex without intravenous contrast. RADIATION DOSE REDUCTION: This exam was performed according to the departmental dose-optimization program which includes automated exposure control, adjustment of the mA and/or kV according to patient size and/or use of iterative reconstruction technique. COMPARISON:  Yesterday FINDINGS: Brain: Large ICH centered at the right basal ganglia and adjacent white matter, with tracking towards the right brainstem is unchanged in shape and size, measuring up to 5 cm anterior to posterior and 4 cm craniocaudal. Regional edema is unchanged. Extensive cortical infarct in the right posterior frontal and parietal lobe. Smaller but more apparent left parietal cortex infarcts since brain MRI  yesterday. Midline shift is 5 mm. Vascular: No hyperdense vessel or unexpected calcification. Skull: Normal. Negative for fracture or focal lesion. Sinuses/Orbits: No acute finding. IMPRESSION: 1. Multiple infarcts by brain MRI yesterday, suspect progressive cortex infarction in the left parietal lobe. 2. Unchanged appearance of right ICH with adjacent edema. Midline shift is 5 mm. Electronically Signed   By: Tiburcio Pea M.D.   On: 06/20/2022 06:09   CT HEAD WO CONTRAST ( )  Result Date: 06/19/2022 CLINICAL DATA:  Follow-up ICH EXAM: CT HEAD WITHOUT CONTRAST TECHNIQUE: Contiguous axial images were obtained from the base of the skull through the vertex without intravenous contrast. RADIATION DOSE REDUCTION: This exam was performed according to the departmental dose-optimization program which includes automated  exposure control, adjustment of the mA and/or kV according to patient size and/or use of iterative reconstruction technique. COMPARISON:  MRI from earlier today. FINDINGS: Brain: Large acute hemorrhage centered at the right basal ganglia and adjacent white matter without progression in size or shape when compared to prior. Cytotoxic edema most apparent in the right frontal parietal cortex, infarcts being under appreciated when compared to preceding brain MRI. Diffuse effacement of subarachnoid spaces, leftward midline shift of 5 mm. Unchanged mild dilatation of the left lateral ventricle. Vascular: No hyperdense vessel or unexpected calcification. Skull: Normal. Negative for fracture or focal lesion. Sinuses/Orbits: No acute finding. IMPRESSION: Right ICH and extensive cortical infarction that is stable from brain MRI earlier today. No new abnormality. Leftward midline shift of 5 mm. Electronically Signed   By: Tiburcio Pea M.D.   On: 06/19/2022 11:13   MR BRAIN W WO CONTRAST  Result Date: 06/19/2022 CLINICAL DATA:  Hemorrhagic stroke workup EXAM: MRI HEAD WITHOUT AND WITH CONTRAST TECHNIQUE: Multiplanar, multiecho pulse sequences of the brain and surrounding structures were obtained without and with intravenous contrast. CONTRAST:  6mL GADAVIST GADOBUTROL 1 MMOL/ML IV SOLN COMPARISON:  Head CT and CTA from 2 days prior FINDINGS: Brain: Multiple areas of restricted diffusion most confluent in the right parietal and posterior temporal cortex. Restricted diffusion is seen surrounding the large and known right basal ganglia hemorrhage which measures up to 5.3 cm anterior to posterior with cleft of fluid surrounding the hematoma. Infarcts in other arterial distributions are also present, including along the left cerebral convexity (especially parietooccipital)and at the right brainstem. Midline shift measures 5 mm. There is periventricular FLAIR hyperintensity suggesting chronic small vessel disease. There is  asymmetric mild dilatation of the left temporal horn. Vascular: Filling defect in the right sigmoid sinus and upper internal jugular vein. Skull and upper cervical spine: Normal marrow signal Sinuses/Orbits: Mild mucosal thickening in the mastoid air cells. Retention cysts in the right maxillary sinus. Diffuse mucosal thickening in the paranasal sinuses. Negative orbits. IMPRESSION: 1. 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 The most confluent area is in the right MCA territory with infarct likely underlying the right basal ganglia hematoma. 2. 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 The pattern of infarcts and hemorrhage do not correlate with the clot location however. 3. Mild dilatation of the temporal horn of the left lateral ventricle. 5 mm of midline shift. Electronically Signed   By: Tiburcio Pea M.D.   On: 06/19/2022 06:59   DG Abd 1 View  Result Date: 06/18/2022 CLINICAL DATA:  Check gastric catheter placement EXAM: ABDOMEN - 1 VIEW COMPARISON:  Film from earlier in the same day. FINDINGS: Gastric catheter has been advanced  and now lies completely within the stomach. Scattered large and small bowel gas is noted. IMPRESSION: Gastric catheter within the stomach as described. Electronically Signed   By: Alcide Clever M.D.   On: 06/18/2022 23:47   DG Abd 1 View  Result Date: 06/18/2022 CLINICAL DATA:  Feeding tube placement. EXAM: ABDOMEN - 1 VIEW COMPARISON:  Jun 18, 2022 (6:44 p.m.) FINDINGS: A nasogastric tube is seen with its distal tip overlying the expected region of the body of the stomach. The distal side hole sits at the level of the gastroesophageal junction. The bowel gas pattern is normal. No radio-opaque calculi or other significant radiographic abnormality are seen. IMPRESSION: Nasogastric tube positioning, as described above. Further advancement of the NG tube by approximately 7 cm  is recommended to decrease the risk of aspiration. Electronically Signed   By: Aram Candela M.D.   On: 06/18/2022 22:23   DG Abd Portable 1V  Result Date: 06/18/2022 CLINICAL DATA:  Check gastric catheter placement EXAM: PORTABLE ABDOMEN - 1 VIEW COMPARISON:  None Available. FINDINGS: Scattered large and small bowel gas is noted. Gastric catheter is noted with the tip in the stomach. Proximal side port lies in the distal esophagus. This should be advanced several cm deeper into the stomach. IMPRESSION: Gastric catheter as described. This should be advanced deeper into the stomach. Electronically Signed   By: Alcide Clever M.D.   On: 06/18/2022 21:24   DG CHEST PORT 1 VIEW  Result Date: 06/18/2022 CLINICAL DATA:  Evaluate central line placement EXAM: PORTABLE CHEST 1 VIEW COMPARISON:  Jun 18, 2022 FINDINGS: The side port of the NG tube is near the GE junction. The distal tip is in the stomach. The ETT is in good position. A left central line is been placed in the interval terminating in the central SVC. No pneumothorax. The left lung is clear. Right middle lobe infiltrate remains. No other interval changes. IMPRESSION: 1. Support apparatus as above. The side port of the NG tube is near the GE junction. Recommend advancing the tube. 2. The left lung is clear. Right middle lobe infiltrate remains. 3. No other interval changes. Electronically Signed   By: Gerome Sam III M.D.   On: 06/18/2022 17:20   Overnight EEG with video  Result Date: 06/18/2022 Charlsie Quest, MD     06/19/2022  9:37 AM Patient Name: KAORI DETHOMAS MRN: [id number] Epilepsy Attending: Charlsie Quest Referring Physician/Provider: Erick Blinks, MD Duration: 06/17/2022 1215 to 06/18/2022 1215 Patient history: 47 y.o. female with hx of cocaine use p/w unresponsive and large R IPH with brain compression and 5mm midline shift. EEG to evaluate for seizure Level of alertness: Awake, asleep AEDs during EEG study: None Technical  aspects: This EEG study was done with scalp electrodes positioned according to the 10-20 International system of electrode placement. Electrical activity was reviewed with band pass filter of 1-70Hz , sensitivity of 7 uV/mm, display speed of 67mm/sec with a 60Hz  notched filter applied as appropriate. EEG data were recorded continuously and digitally stored.  Video monitoring was available and reviewed as appropriate. Description: The posterior dominant rhythm consists of 7.5 Hz activity of moderate voltage (25-35 uV) seen predominantly in posterior head regions, symmetric and reactive to eye opening and eye closing. Sleep was characterized by vertex waves, sleep spindles (12 to 14 Hz), maximal frontocentral region. EEG showed continuous 3 to 6 Hz theta-delta slowing in right hemisphere, maximal right temporal region. Intermittent generalized 3-6hz  theta-delta slowing was also noted. Hyperventilation and  photic stimulation were not performed.   ABNORMALITY - Continuous slow, right hemisphere, maximal right temporal region - Intermittent slow, generalized IMPRESSION: This study is suggestive of cortical dysfunction arising from right hemisphere, maximal right temporal region likely secondary to underlying structural abnormality. Additionally there is mild to moderate diffuse encephalopathy. No seizures or epileptiform discharges were seen throughout the recording. Charlsie Quest   DG Chest Port 1 View  Result Date: 06/18/2022 CLINICAL DATA:  Pulmonary infiltrates EXAM: PORTABLE CHEST 1 VIEW COMPARISON:  06/17/2022 FINDINGS: Endotracheal tube is in stable position. NG tube tip in the proximal stomach with the side port near the GE junction. The NG tube appears to be buckled/looped within the neck near the thoracic inlet. Heart mediastinal contours within normal limits. Right middle lobe airspace opacity again noted, stable. No confluent opacity on the left. No effusions or acute bony abnormality. IMPRESSION:  Continued right middle lobe infiltrate, unchanged. NG tube appears to be buckled slashed looped in the upper esophagus near the thoracic inlet. Tip is in the proximal stomach. Electronically Signed   By: Charlett Nose M.D.   On: 06/18/2022 07:11   DG Abd 1 View  Result Date: 06/17/2022 CLINICAL DATA:  Nasogastric tube placement. EXAM: ABDOMEN - 1 VIEW COMPARISON:  Radiograph earlier today. FINDINGS: Tip of the enteric tube is below the diaphragm in the stomach, the side port is just at the gastroesophageal junction. Normal upper abdominal bowel gas pattern. IMPRESSION: Tip of the enteric tube below the diaphragm in the stomach, side-port just at the gastroesophageal junction. Electronically Signed   By: Narda Rutherford M.D.   On: 06/17/2022 15:43   ECHOCARDIOGRAM COMPLETE  Result Date: 06/17/2022    ECHOCARDIOGRAM REPORT   Patient Name:   PAILYN TABB Date of Exam: 06/17/2022 Medical Rec #:  [id number]          Height:       65.0 in Accession #:    [id number]         Weight:       140.0 lb Date of Birth:  15-Apr-1975          BSA:          1.700 m Patient Age:    46 years           BP:           122/111 mmHg Patient Gender: F                  HR:           102 bpm. Exam Location:  Inpatient Procedure: 2D Echo, Cardiac Doppler and Color Doppler Indications:    Stroke  History:        Patient has no prior history of Echocardiogram examinations.                 ICH, sepsis; Risk Factors:Current Smoker and Substance abuse.  Sonographer:    Wallie Char Referring Phys: [id number] [facility]  Sonographer Comments: Technically challenging study due to limited acoustic windows and echo performed with patient supine and on artificial respirator. IMPRESSIONS  1. Left ventricular ejection fraction, by estimation, is 50 to 55%. The left ventricle has low normal function. The left ventricle has no regional wall motion abnormalities. There is moderate left ventricular hypertrophy. Left ventricular diastolic  parameters are consistent with Grade I diastolic dysfunction (impaired relaxation).  2. Right ventricular systolic function is normal. The right ventricular size is normal. There is mildly elevated pulmonary  artery systolic pressure.  3. The mitral valve is normal in structure. Trivial mitral valve regurgitation. No evidence of mitral stenosis.  4. The aortic valve is tricuspid. Aortic valve regurgitation is not visualized. No aortic stenosis is present. FINDINGS  Left Ventricle: Left ventricular ejection fraction, by estimation, is 50 to 55%. The left ventricle has low normal function. The left ventricle has no regional wall motion abnormalities. The left ventricular internal cavity size was normal in size. There is moderate left ventricular hypertrophy. Left ventricular diastolic parameters are consistent with Grade I diastolic dysfunction (impaired relaxation). Right Ventricle: The right ventricular size is normal. No increase in right ventricular wall thickness. Right ventricular systolic function is normal. There is mildly elevated pulmonary artery systolic pressure. The tricuspid regurgitant velocity is 2.74  m/s, and with an assumed right atrial pressure of 8 mmHg, the estimated right ventricular systolic pressure is 38.0 mmHg. Left Atrium: Left atrial size was normal in size. Right Atrium: Right atrial size was normal in size. Pericardium: There is no evidence of pericardial effusion. Mitral Valve: The mitral valve is normal in structure. Trivial mitral valve regurgitation. No evidence of mitral valve stenosis. MV peak gradient, 5.3 mmHg. The mean mitral valve gradient is 2.0 mmHg. Tricuspid Valve: The tricuspid valve is normal in structure. Tricuspid valve regurgitation is trivial. Aortic Valve: The aortic valve is tricuspid. Aortic valve regurgitation is not visualized. No aortic stenosis is present. Aortic valve mean gradient measures 4.0 mmHg. Aortic valve peak gradient measures 6.2 mmHg. Aortic valve  area, by VTI measures 2.39 cm. Pulmonic Valve: The pulmonic valve was normal in structure. Pulmonic valve regurgitation is not visualized. Aorta: The aortic root and ascending aorta are structurally normal, with no evidence of dilitation. IAS/Shunts: The interatrial septum was not well visualized.  LEFT VENTRICLE PLAX 2D LVIDd:         4.50 cm     Diastology LVIDs:         3.30 cm     LV e' medial:    6.63 cm/s LV PW:         1.10 cm     LV E/e' medial:  11.0 LV IVS:        1.00 cm     LV e' lateral:   5.86 cm/s LVOT diam:     1.90 cm     LV E/e' lateral: 12.4 LV SV:         40 LV SV Index:   23 LVOT Area:     2.84 cm  LV Volumes (MOD) LV vol d, MOD A2C: 68.4 ml LV vol d, MOD A4C: 77.5 ml LV vol s, MOD A2C: 33.9 ml LV vol s, MOD A4C: 37.8 ml LV SV MOD A2C:     34.5 ml LV SV MOD A4C:     77.5 ml LV SV MOD BP:      39.2 ml RIGHT VENTRICLE             IVC RV S prime:     13.90 cm/s  IVC diam: 1.50 cm TAPSE (M-mode): 1.6 cm LEFT ATRIUM             Index        RIGHT ATRIUM           Index LA diam:        3.00 cm 1.76 cm/m   RA Area:     10.40 cm LA Vol (A2C):   23.2 ml 13.65 ml/m  RA Volume:  20.40 ml  12.00 ml/m LA Vol (A4C):   19.2 ml 11.29 ml/m LA Biplane Vol: 21.2 ml 12.47 ml/m  AORTIC VALVE AV Area (Vmax):    2.85 cm AV Area (Vmean):   2.41 cm AV Area (VTI):     2.39 cm AV Vmax:           124.00 cm/s AV Vmean:          93.450 cm/s AV VTI:            0.166 m AV Peak Grad:      6.2 mmHg AV Mean Grad:      4.0 mmHg LVOT Vmax:         124.50 cm/s LVOT Vmean:        79.500 cm/s LVOT VTI:          0.140 m LVOT/AV VTI ratio: 0.84  AORTA Ao Root diam: 3.30 cm Ao Asc diam:  2.80 cm MITRAL VALVE               TRICUSPID VALVE MV Area (PHT): 4.06 cm    TR Peak grad:   30.0 mmHg MV Area VTI:   1.72 cm    TR Vmax:        274.00 cm/s MV Peak grad:  5.3 mmHg MV Mean grad:  2.0 mmHg    SHUNTS MV Vmax:       1.15 m/s    Systemic VTI:  0.14 m MV Vmean:      68.2 cm/s   Systemic Diam: 1.90 cm MV Decel Time: 187 msec MV E  velocity: 72.90 cm/s MV A velocity: 89.60 cm/s MV E/A ratio:  0.81 Epifanio Lesches MD Electronically signed by Epifanio Lesches MD Signature Date/Time: 06/17/2022/10:47:05 AM    Final    CT VENOGRAM HEAD  Result Date: 06/17/2022 CLINICAL DATA:  Intracranial hemorrhage. EXAM: CT VENOGRAM HEAD TECHNIQUE: Venographic phase images of the brain were obtained following the administration of intravenous contrast. Multiplanar reformats and maximum intensity projections were generated. RADIATION DOSE REDUCTION: This exam was performed according to the departmental dose-optimization program which includes automated exposure control, adjustment of the mA and/or kV according to patient size and/or use of iterative reconstruction technique. CONTRAST:  75mL OMNIPAQUE IOHEXOL 350 MG/ML SOLN COMPARISON:  None Available. FINDINGS: Filling defect of the right transverse sinus and right transverse sigmoid junction which are somewhat large and tubular for arachnoid granulations, although an arachnoid granulation at the left transverse sigmoid junction appears nearly similar. The location would also not explain the acute hemorrhage. No visible cortical or central venous thrombosis. IMPRESSION: No cortical or central venous thrombosis to explain the ICH. There are filling defects at the right transverse and transverse sigmoid dural sinuses which are prominent for arachnoid granulations but not definitive for thrombus. Attention at follow-up imaging. Electronically Signed   By: Tiburcio Pea M.D.   On: 06/17/2022 05:30   CT ANGIO HEAD NECK W WO CM  Result Date: 06/17/2022 CLINICAL DATA:  Found unresponsive. Intracranial hemorrhage. History of cocaine use EXAM: CT ANGIOGRAPHY HEAD AND NECK WITH AND WITHOUT CONTRAST TECHNIQUE: Multidetector CT imaging of the head and neck was performed using the standard protocol during bolus administration of intravenous contrast. Multiplanar CT image reconstructions and MIPs were  obtained to evaluate the vascular anatomy. Carotid stenosis measurements (when applicable) are obtained utilizing NASCET criteria, using the distal internal carotid diameter as the denominator. RADIATION DOSE REDUCTION: This exam was performed according to the departmental dose-optimization program which includes automated  exposure control, adjustment of the mA and/or kV according to patient size and/or use of iterative reconstruction technique. CONTRAST:  75mL OMNIPAQUE IOHEXOL 350 MG/ML SOLN COMPARISON:  Head CT from earlier today FINDINGS: CTA NECK FINDINGS Aortic arch: Unremarkable Right carotid system: Mixed density plaque at the bifurcation with some low-density plaque bulging into the ICA bulb. No ulceration or flow limiting stenosis Left carotid system: Mainly low-density plaque at the bifurcation without stenosis or ulceration. Vertebral arteries: No proximal subclavian stenosis. The vertebral arteries are somewhat tortuous but smoothly contoured and diffusely patent Skeleton: No acute or aggressive finding. Other neck: No acute finding Upper chest: Mild opacification of dependent lungs which could be atelectasis. Centrilobular emphysema. Review of the MIP images confirms the above findings CTA HEAD FINDINGS Anterior circulation: No aneurysm or spot sign seen underlying the ICH. There is a aneurysm projecting leftward from the left carotid terminus which measures 4 mm. No adjacent hemorrhage. No major branch occlusion. There is undulation of the bilateral intracranial branches which is likely atheromatous given findings in the neck. No segmental beading. Some less intense flow and right MCA branches is likely related to mass effect by the ICH. Posterior circulation: The vertebral and basilar arteries arediffusely patent. Undulation of the left more than right PCA with up to moderate narrowing on the left, likely atheromatous given the findings in the neck. Venous sinuses: Reference dedicated CT venogram  Anatomic variants: No acute finding Review of the MIP images confirms the above findings IMPRESSION: 1. No vascular lesion or spot sign seen at the acute ICH. 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 3. 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 Electronically Signed   By: Tiburcio Pea M.D.   On: 06/17/2022 05:18   CT Head Wo Contrast  Result Date: 06/17/2022 CLINICAL DATA:  Altered mental status EXAM: CT HEAD WITHOUT CONTRAST TECHNIQUE: Contiguous axial images were obtained from the base of the skull through the vertex without intravenous contrast. RADIATION DOSE REDUCTION: This exam was performed according to the departmental dose-optimization program which includes automated exposure control, adjustment of the mA and/or kV according to patient size and/or use of iterative reconstruction technique. COMPARISON:  None Available. FINDINGS: Brain: There is a large area of parenchymal hemorrhage identified in the right cerebral hemisphere centered primarily within the basal ganglia and extending superiorly into the centrum semi ovale. This measures approximately 4.7 x 4.0 cm in greatest AP and transverse dimensions respectively. It extends for approximately 4.7 cm in craniocaudad dimension. Significant mass-effect upon the right lateral ventricle is noted. Mild midline shift of approximately 5 mm is noted. Some surrounding edema is seen as well. No other hemorrhage is noted. Relative loss of the sulcal markings is noted likely related to some increased intracranial pressure. Vascular: No hyperdense vessel or unexpected calcification. Skull: Normal. Negative for fracture or focal lesion. Sinuses/Orbits: No acute finding. Other: None. IMPRESSION: Right-sided intraparenchymal hemorrhage as described with associated edema and mild midline shift of 5 mm from right to left. Critical Value/emergent results were called by telephone at the time of interpretation on 06/17/2022 at 2:27 am to Dr.  Donna Bernard , who verbally acknowledged these results. Electronically Signed   By: Alcide Clever M.D.   On: 06/17/2022 02:29   DG Abdomen 1 View  Result Date: 06/17/2022 CLINICAL DATA:  Check gastric catheter placement EXAM: ABDOMEN - 1 VIEW COMPARISON:  None Available. FINDINGS: Gastric catheter is noted extending into the stomach. Proximal side port lies at the gastroesophageal junction. This should  be advanced further into the stomach. No free air is seen. No bony abnormality is noted. IMPRESSION: Gastric catheter as described. Electronically Signed   By: Alcide Clever M.D.   On: 06/17/2022 02:16   DG Chest Port 1 View  Result Date: 06/17/2022 CLINICAL DATA:  Check endotracheal tube placement EXAM: PORTABLE CHEST 1 VIEW COMPARISON:  10/21/2020 FINDINGS: Cardiac shadow is within normal limits. Endotracheal tube is noted 1.5 cm above the carina. Gastric catheter is noted extending into the stomach. Patchy increased density is noted in the right middle lobe beneath the minor fissure consistent with acute infiltrate. No sizable effusion is noted. No bony abnormality is seen. IMPRESSION: Right middle lobe infiltrate. Tubes and lines as described. Electronically Signed   By: Alcide Clever M.D.   On: 06/17/2022 02:16     PHYSICAL EXAM  Temp:  [99.4 F (37.4 C)-100.3 F (37.9 C)] 99.9 F (37.7 C) (05/22 1110) Pulse Rate:  [56-101] 65 (05/22 1230) Resp:  [16-37] 22 (05/22 1230) BP: (102-175)/(55-99) 130/68 (05/22 1230) SpO2:  [98 %-100 %] 99 % (05/22 1230) FiO2 (%):  [40 %] 40 % (05/22 1200) Weight:  [68 kg] 68 kg (05/22 0500)   General:  She is s/p tracheostomy.  On ventilator.  Not sedated   Neuro: Eyes are closed.  Unresponsive.  She will blink her eyes to threat.   .  Does not open eyes to command, unable to nod yes or no to questions.  Will barely open eyes to stimuli but not track examiner.  Pupils are 2 mm and sluggish blink to threat on the right and on the left.  Does not track examiner.   Oculocephalic intact.  Gag cough intact.  Corneals positive on the right absent on the left.  No movement in the left arm or leg.  She withdraws slightly in the right arm and leg to painful stimuli.  She was not able to grip my fingers or consistently follow commands on the right side.    ASSESSMENT/PLAN Ms. SHALYNN MANALAC is a 47 y.o. female with history of cocaine abuse admitted for unresponsiveness.  Intubated in ER for airway protection.  No tPA given due to ICH.    ICH:  right BG and CR ICH, still more likely due to cocaine induced hypertension vs. 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 Vasospasm from ICH vs. 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  2D Echo EF 50 to 55% LDL 65 HgbA1c 5.9 UDS positive for cocaine Heparin subcu for VTE prophylaxis No antithrombotic prior to admission, now on No antithrombotic due to ICH. Therapy recommendations: Pending Disposition: Pending, prognosis poor, palliative care on board. Family meeting held again today.  Continue full support and care.  Cerebral edema CT right BG and CR large ICH, 5 mm midline shift CT  repeat 5/12 stable ICH with leftward midline shift of 5 mm CT repeat 5/13 unchanged appearance of right ICH with  adjacent edema. Midline shift is 5 mm. Na 151->155->157->159-> 155->158->150->159->162->161->159->158->155 -> 154>153 off 3% saline Na monitoring LTM EEG cortical dysfunction arising from right hemisphere with diffuse encephalopathy, no seizures  Respiratory failure Aspiration pneumonia Leukocytosis Pneumothorax  Intubated on vent Still on fentanyl, low dose CCM on board CXR right middle lobe infiltrate On azithromycin and Rocephin -> unasyn and zyvox Tmax 100.6->100.3->afebrile->100.3 -> 101.9 WBC 22.2->20.1->15.8->14.0->13.5->15.0 -> 16.5>20 S/p chest tube  Hypertension BP unstable Off cleviprex Labetalol and hydralazine IV PRN BP goal < 160 Long term BP goal normotensive Due to increased creatinine we will hold off on lisinopril for now.  Can use as needed to control blood pressure.  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  Hospital day # 12 Patient is now status post tracheostomy but remains on ventilatory support for respiratory failure..  Plan weaning trials soon.  Neurological exam is limited due to sedation she is on antibiotics.  PEG tube has been ordered.  No family available at the bedside.  Discussed with her daughter over the phone yesterday about her overall prognosis but family is quite clear they want aggressive care including full tracheostomy care and PEG tube and nursing home placement..     This patient is critically ill and at significant risk of neurological worsening, death and care requires constant  monitoring of vital signs, hemodynamics,respiratory and cardiac monitoring, extensive review of multiple databases, frequent neurological assessment, discussion with family, other specialists and medical decision making of high complexity.I have made any additions or clarifications directly to the above note.This critical care time does not reflect procedure time, or teaching time or supervisory time of PA/NP/Med Resident etc but could involve care discussion time.  I spent 30 minutes of neurocritical care time  in the care of  this patient.          To contact Stroke Continuity provider, please refer to [email or web address]. After hours, contact General Neurology

## 2022-06-29 NOTE — Progress Notes (Signed)
NAME:  Rebecca [name], MRN:  [id number], DOB:  [birth year], LOS: 12 ADMISSION DATE:  06/17/2022, CONSULTATION DATE:  06/29/22 REFERRING MD:  EDP, CHIEF COMPLAINT:  unresponsive   History of Present Illness:  47 yo female with hx of polysubstance abuse presented brought to [facility] ED with AMS.  Intubated for airway protection.  CT head showed large Rt ICH with 5 mm midline shift.  UDS positive for cocaine.  Transferred to [facility] for further management.  PCCM consulted to assist with management in ICU.   Hx from chart and medical team.  Pertinent  Medical History  Substance abuse  Significant Hospital Events: Including procedures, antibiotic start and stop dates in addition to other pertinent events   5/10 Admit, neurosurgery consulted 5/11 remains on ventilator with hypertonic saline and EEG in place. CVC placed.  5/12 no acute events overnight MRI brain 5/12 >> multiple acute infarcts in various distributions consistent with central embolic process, most affected right MCA territory.  Right basal ganglia hematoma.  Dural venous sinus thrombosis in the right sigmoid sinus extending up into the upper right IJ.  Mild dilation temporal horn left lateral ventricle with 5 mm of midline shift 5/19 completed Unasyn. Family meeting w/ palliative "wants to pursue an aggressive path of care inclusive of a tracheostomy, gastrostomy tube, and long term placement. She feels that more time is needed".   Interim History / Subjective:  No acute events  Objective   Blood pressure (!) 138/93, pulse 94, temperature 99.6 F (37.6 C), temperature source Oral, resp. rate (!) 25, height 5\' 5"  (1.651 m), weight 68 kg, SpO2 99 %.    Vent Mode: PRVC FiO2 (%):  [40 %] 40 % Set Rate:  [18 bmp] 18 bmp Vt Set:  [480 mL] 480 mL PEEP:  [5 cmH20-10 cmH20] 5 cmH20 Plateau Pressure:  [18 cmH20-29 cmH20] 18 cmH20   Intake/Output Summary (Last 24 hours) at 06/29/2022 0846 Last data filed at 06/29/2022 0730 Gross per 24  hour  Intake 2057.96 ml  Output 670 ml  Net 1387.96 ml   Filed Weights   06/27/22 0355 06/28/22 0500 06/29/22 0500  Weight: 64.4 kg 65.4 kg 68 kg   Physical exam S/p fentanyl overnight General:  critically ill appearing adult female lying in bed in NAD HEENT: MM pink/moist, pupils dysconjugate gaze, R 3/r, L 4/ oval/ reactive, anicteric, cortrak, midline trach-sutured  Neuro: not opening eyes or f/c, flicker on L with noxious, flaccid on R CV: rr, SB/ SR, no murmur PULM:  non labored on full MV support, clear, minimal tan secretions, +cough, flipped to PSV 10/5 with immediate high RR and low TVs, left pleural CT> minimal serous drainage, no airleak GI:  round, soft abd, +bs, purwick Extremities: warm/dry, generalized edema  Skin: no rashes   Labs> K 3.5, Na 149, sCr 0.8, WBC 12.4> 11.5, H/H 7.3/ 25.2> 8.2/ 28.5 Tmax 100.1 UOP 600 ml/ 24hrs plus unmeasured urinary occurrences Net +15.6 L CT output 120> 130> 70 Wt 24> 65> 68kg  Resolved Hospital Problem list    Urinary tract infection Treated with CTX x 4 days, SVT - resolved Hypertensive Emergency   Assessment & Plan:   Acute right basal ganglia intraparenchymal hemorrhage Cerebral edema with brain compression R MCA Stroke and B ACA punctate infarcts Acute R Sigmoid sinus thrombosis and IJ DVST Induced hypernatremia and hyperchloremia -s/p hypertonic saline - Note poor prognosis, suspect she will remain plegic on the left.  Question whether she will be able to protect  her airway Plan No anticoagulation due to basal ganglia bleeding S/p perc trach 5/20 S/p CT a/p, PEG per IR  Cont PT efforts Ongoing GOC, poor prognosis    Acute Hypoxic Respiratory Failure Bilateral multifocal pneumonia with MRSA L Pneumothorax s/p chest tube  Mental status has been primary barrier to extubation.  Waxes and wanes  S/p per trach 5/20 Plan Cont full MV support and daily PSV trials- failed 5/22 Cont CT while on positive pressure,  minimal output VAP bundle/ PPI PAD protocol prn  Completed Unasyn 5/20 and linezolid will complete today, 5/22 Diurese prn/ prn CXR Prn BD Cont trach care   Pre-renal AKI, resolved Hypernatremia  Plan Cont FWF, improving Na  Wts up 8kg with anasarca noted on CT> lasix 20mg  x 1 today with KCL replete Continue purwick> monitor closely with liquid stools and for retention Daily wts/ strict I/Os Trend renal indices, renal dose meds, and avoid nephrotoxins   Demand cardiac ischemia with elevated troponin w/ New HFrEF, HTN - LVEF 50-55% this admission Plan Continue telemetry Continue Norvasc 10 mg, carvedilol 3.125mg  BID PRN hydralazine Diurese today  Intermittent fluid and electrolyte imbalance: Hyponatremia (was on hypertonic saline), hypokalemia, hyperchloremia Plan KCL replete today Continue free water as above  Trend electrolytes, replete prn  Anemia of critical illness - stable Plan Stable CBC, trend  Transfuse is hgb <7 Continue SQ heparin   At risk for malnutrition - TF per RD   Best Practice:   Diet/type: tubefeeds DVT prophylaxis: prophylactic heparin  GI prophylaxis: PPI Lines: N/A Foley:  Yes, and it is still needed Code Status:  full code Last date of multidisciplinary goals of care discussion: Appreciate palliative care input.  Significant discussions on 5/18.    Critical care time: 30 minutes     Posey Boyer, MSN, AG-ACNP-BC [facility] 06/29/2022, 9:17 AM  See Amion for pager If no response to pager, please call PCCM consult pager After 7:00 pm call Elink

## 2022-06-29 NOTE — Progress Notes (Signed)
Patient's pupils unequal but reactive on reassessment. Left pupil 4mm right pupil 2mm. Stroke MD paged and at bedside.

## 2022-06-30 ENCOUNTER — Inpatient Hospital Stay (HOSPITAL_COMMUNITY): Payer: Medicaid Other

## 2022-06-30 DIAGNOSIS — I610 Nontraumatic intracerebral hemorrhage in hemisphere, subcortical: Secondary | ICD-10-CM | POA: Diagnosis not present

## 2022-06-30 DIAGNOSIS — D509 Iron deficiency anemia, unspecified: Secondary | ICD-10-CM

## 2022-06-30 LAB — CBC
HCT: 28.2 % — ABNORMAL LOW (ref 36.0–46.0)
Hemoglobin: 8.2 g/dL — ABNORMAL LOW (ref 12.0–15.0)
MCH: 21.9 pg — ABNORMAL LOW (ref 26.0–34.0)
MCHC: 29.1 g/dL — ABNORMAL LOW (ref 30.0–36.0)
MCV: 75.2 fL — ABNORMAL LOW (ref 80.0–100.0)
Platelets: 547 10*3/uL — ABNORMAL HIGH (ref 150–400)
RBC: 3.75 MIL/uL — ABNORMAL LOW (ref 3.87–5.11)
RDW: 20.9 % — ABNORMAL HIGH (ref 11.5–15.5)
WBC: 15.1 10*3/uL — ABNORMAL HIGH (ref 4.0–10.5)
nRBC: 0.1 % (ref 0.0–0.2)

## 2022-06-30 LAB — RENAL FUNCTION PANEL
Albumin: 1.9 g/dL — ABNORMAL LOW (ref 3.5–5.0)
Anion gap: 11 (ref 5–15)
BUN: 24 mg/dL — ABNORMAL HIGH (ref 6–20)
CO2: 23 mmol/L (ref 22–32)
Calcium: 8.8 mg/dL — ABNORMAL LOW (ref 8.9–10.3)
Chloride: 111 mmol/L (ref 98–111)
Creatinine, Ser: 0.72 mg/dL (ref 0.44–1.00)
GFR, Estimated: >60 mL/min (ref >60)
Glucose, Bld: 118 mg/dL — ABNORMAL HIGH (ref 70–99)
Phosphorus: 4.4 mg/dL (ref 2.5–4.6)
Potassium: 3.5 mmol/L (ref 3.5–5.1)
Sodium: 145 mmol/L (ref 135–145)

## 2022-06-30 LAB — MAGNESIUM: Magnesium: 2.1 mg/dL (ref 1.7–2.4)

## 2022-06-30 MED ORDER — POTASSIUM CHLORIDE 20 MEQ PO PACK
40.0000 meq | PACK | Freq: Once | ORAL | Status: AC
  Administered 2022-06-30: 40 meq
  Filled 2022-06-30: qty 2

## 2022-06-30 MED ORDER — SENNOSIDES-DOCUSATE SODIUM 8.6-50 MG PO TABS: 1.0000 | ORAL_TABLET | Freq: Every evening | ORAL | Status: DC | PRN

## 2022-06-30 MED ORDER — DOCUSATE SODIUM 50 MG/5ML PO LIQD: 100.0000 mg | Freq: Two times a day (BID) | ORAL | Status: DC | PRN

## 2022-06-30 NOTE — Progress Notes (Signed)
[facility] ADULT ICU REPLACEMENT PROTOCOL   The patient does apply for the [facility] Adult ICU Electrolyte Replacment Protocol based on the criteria listed below:   1.Exclusion criteria: TCTS, ECMO, Dialysis, and Myasthenia Gravis patients 2. Is GFR >/= 30 ml/min? Yes.    Patient's GFR today is >60 3. Is SCr </= 2? Yes.   Patient's SCr is 0.72 mg/dL 4. Did SCr increase >/= 0.5 in 24 hours? No. 5.Pt's weight >40kg  Yes.   6. Abnormal electrolyte(s):   K 3.5  7. Electrolytes replaced per protocol 8.  Call MD STAT for K+ </= 2.5, Phos </= 1, or Mag </= 1 Physician:  Shawn Stall R Safaa Stingley 06/30/2022 3:38 AM

## 2022-06-30 NOTE — Progress Notes (Signed)
Interventional Radiology Brief Note:  IR consulted for gastrostomy tube placement.  CT Abdomen performed for review of gastric anatomy and unfortunately shows colon located anterior to the stomach.  Not amenable to percutaneous biopsy. Ordering service made aware. Recommend surgical consult.   Loyce Dys, MS RD PA-C 9:59 AM

## 2022-06-30 NOTE — Progress Notes (Signed)
Physical Therapy Treatment Patient Details Name: Rebecca [name] MRN: [id number] DOB: [birth year] Today's Date: 06/30/2022   History of Present Illness 47 yo female with hx of polysubstance abuse presented 5/10 to [facility] ED with AMS.  Intubated 5/10 for airway protection.  CT head showed large Rt ICH with 5 mm midline shift.  UDS positive for cocaine.  Transferred to [facility] for further management.  Chest tube 5/13.  Trach 5/20.    PT Comments    Pt admitted with above diagnosis. Pt with less responsiveness today and needed total assist to sit EOB with no command following and would not open eyes.  Will continue to make attempts to work with pt.  Pt currently with functional limitations due to balance and endurance deficits. Pt will benefit from acute skilled PT to increase their independence and safety with mobility to allow discharge.      Recommendations for follow up therapy are one component of a multi-disciplinary discharge planning process, led by the attending physician.  Recommendations may be updated based on patient status, additional functional criteria and insurance authorization.  Follow Up Recommendations       Assistance Recommended at Discharge Frequent or constant Supervision/Assistance  Patient can return home with the following A lot of help with walking and/or transfers;A lot of help with bathing/dressing/bathroom;Assistance with cooking/housework;Assist for transportation;Help with stairs or ramp for entrance   Equipment Recommendations  Other (comment) (TBA)    Recommendations for Other Services       Precautions / Restrictions Precautions Precautions: Fall Precaution Comments: Chest tube, trach on vent, cortrak, flexi seal Restrictions Weight Bearing Restrictions: No     Mobility  Bed Mobility Overal bed mobility: Needs Assistance Bed Mobility: Supine to Sit     Supine to sit: Total assist, +2 for physical assistance     General bed mobility comments:  Needed total assist for coming to EOB and lying down.    Transfers                   General transfer comment: NT    Ambulation/Gait               General Gait Details: NT   Stairs             Wheelchair Mobility    Modified Rankin (Stroke Patients Only)       Balance Overall balance assessment: Needs assistance Sitting-balance support: Bilateral upper extremity supported, No upper extremity supported, Feet supported Sitting balance-Leahy Scale: Zero Sitting balance - Comments: Pt did not elicit balance reactions today when sitting with total assist for 5 minutes.no active movement noted in sitting and pt not holding her head up at all. Postural control: Posterior lean, Right lateral lean, Left lateral lean                                  Cognition Arousal/Alertness: Lethargic Behavior During Therapy: Flat affect Overall Cognitive Status: Difficult to assess                                 General Comments: Not following commands today.   No attempt at communication or head nodding.  would not open eyes.        Exercises General Exercises - Lower Extremity Ankle Circles/Pumps: PROM, Both, 10 reps, Supine Heel Slides: PROM, Both, 5 reps, Supine  General Comments General comments (skin integrity, edema, etc.): Trach on vent with 40% FiO2 and PEEP 5 with VSS      Pertinent Vitals/Pain Pain Assessment Pain Assessment: No/denies pain    Home Living                          Prior Function            PT Goals (current goals can now be found in the care plan section) Acute Rehab PT Goals Patient Stated Goal: unable to state Progress towards PT goals: Not progressing toward goals - comment (Incr lethargy)    Frequency    Min 3X/week      PT Plan Current plan remains appropriate    Co-evaluation              AM-PAC PT "6 Clicks" Mobility   Outcome Measure  Help needed turning from  your back to your side while in a flat bed without using bedrails?: Total Help needed moving from lying on your back to sitting on the side of a flat bed without using bedrails?: Total Help needed moving to and from a bed to a chair (including a wheelchair)?: Total Help needed standing up from a chair using your arms (e.g., wheelchair or bedside chair)?: Total Help needed to walk in hospital room?: Total Help needed climbing 3-5 steps with a railing? : Total 6 Click Score: 6    End of Session   Activity Tolerance: Patient limited by fatigue;Patient limited by lethargy Patient left: in bed;with call bell/phone within reach;with bed alarm set Nurse Communication: Mobility status;Need for lift equipment PT Visit Diagnosis: Muscle weakness (generalized) (M62.81)     Time: 9629-5284 PT Time Calculation (min) (ACUTE ONLY): 19 min  Charges:  $Therapeutic Activity: 8-22 mins                     [facility] M,PT Acute Rehab Services [phone]    Bevelyn Buckles 06/30/2022, 1:24 PM

## 2022-06-30 NOTE — Progress Notes (Addendum)
[facility] PROGRESS NOTE   Rebecca [name] ZOX:[id number] DOB: [birth year] DOA: 06/17/2022  PCP: Patient, No Pcp Per  Brief History/Interval Summary: 47 yo female with hx of polysubstance abuse presented brought to [facility] ED with AMS. Intubated for airway protection. CT head showed large Rt ICH with 5 mm midline shift. UDS positive for cocaine. Transferred to [facility] for further management.   Consultants: Neurology.  Critical care medicine.  Procedures: Tracheostomy.  Chest tube placement.    Subjective/Interval History: Patient is unresponsive.    Assessment/Plan:  Acute right basal ganglia intraparenchymal hemorrhage/cerebral edema with brain compression/right MCA stroke/acute right sigmoid thrombosis Patient was admitted to the ICU.  She was initially intubated.  Seen by neurology. Underwent multiple imaging studies as outlined in the note. Echocardiogram showed EF of 50 to 55%.  LDL 65.  HbA1c 5.9.  Urine drug screen positive for cocaine. Per neurology notes her prognosis is poor.  Palliative care was consulted.  Family wants to pursue aggressive care. No anticoagulation due to intracranial hemorrhage. She is unresponsive this morning. Management per neurology.  Acute respiratory failure with hypoxia/bilateral multifocal pneumonia with MRSA/left pneumothorax status post chest tube Pulmonology is following.  Patient underwent tracheostomy placement on 5/20.  Requiring ventilator support. Patient previously completed Unasyn on 5/20.  Also completed linezolid on 5/22. Low-grade fever noted.  WBC is 15.1 this morning. Blood cultures from 5/17 did not show any growth.  Sputum/tracheal aspirate cultures grew MRSA on 5/14.  Acute kidney injury/hyponatremia Resolved.  Microcytic anemia/anemia of critical illness No evidence of overt bleeding.  Hemoglobin is stable.  Demand ischemia with elevated troponin/essential hypertension EF noted to be mildly low at 50 to  55%. Noted to be on amlodipine and carvedilol.  Nutrition Noted to be on tube feedings.  Plan is for PEG tube placement by interventional radiology.  ADDENDUM Daughter Cabin crew) arrived from [place]. Spoke at bedside. Update provided and poor prognosis conveyed. She understood. She then asked to speak with me again. She has spoken to rest of the family and wants to withdraw care at 2pm tomorrow. This will give family opportunity to visit patient. She agrees to change to DNR in the interim. But will continue rest of care as before until 2pm tomorrow.   DVT Prophylaxis: Subcutaneous heparin Code Status: Full code Family Communication: No family at bedside Disposition Plan: To be determined  Status is: Inpatient Remains inpatient appropriate because: Acute stroke      Medications: Scheduled:  amLODipine  10 mg Per Tube Daily   carvedilol  3.125 mg Per Tube BID WC   Chlorhexidine Gluconate Cloth  6 each Topical Daily   docusate  100 mg Per Tube BID   feeding supplement (PROSource TF20)  60 mL Per Tube Daily   fiber supplement (BANATROL TF)  60 mL Per Tube BID   folic acid  1 mg Per Tube Daily   free water  200 mL Per Tube Q4H   heparin injection (subcutaneous)  5,000 Units Subcutaneous Q8H   ipratropium-albuterol  3 mL Nebulization BID   multivitamin with minerals  1 tablet Per Tube Daily   mouth rinse  15 mL Mouth Rinse Q2H   pantoprazole (PROTONIX) IV  40 mg Intravenous QHS   senna-docusate  1 tablet Per Tube QHS   sodium chloride flush  10 mL Intrapleural Q8H   thiamine  100 mg Per Tube Daily   Continuous:  sodium chloride Stopped (06/28/22 1214)   feeding supplement (VITAL 1.5 CAL) 45 mL/hr  at 06/30/22 0500   ZOX:WRUEAV chloride, acetaminophen **OR** acetaminophen (TYLENOL) oral liquid 160 mg/5 mL **OR** acetaminophen, hydrALAZINE, mouth rinse, oxyCODONE  Antibiotics: Anti-infectives (From admission, onward)    Start     Dose/Rate Route Frequency Ordered Stop    06/22/22 1000  linezolid (ZYVOX) IVPB 600 mg        600 mg 300 mL/hr over 60 Minutes Intravenous Every 12 hours 06/22/22 0754 06/29/22 2212   06/20/22 0900  Ampicillin-Sulbactam (UNASYN) 3 g in sodium chloride 0.9 % 100 mL IVPB  Status:  Discontinued        3 g 200 mL/hr over 30 Minutes Intravenous Every 6 hours 06/20/22 0759 06/26/22 1456   06/17/22 0830  cefTRIAXone (ROCEPHIN) 2 g in sodium chloride 0.9 % 100 mL IVPB  Status:  Discontinued        2 g 200 mL/hr over 30 Minutes Intravenous Every 24 hours 06/17/22 0750 06/20/22 0744   06/17/22 0830  azithromycin (ZITHROMAX) 500 mg in sodium chloride 0.9 % 250 mL IVPB  Status:  Discontinued        500 mg 250 mL/hr over 60 Minutes Intravenous Every 24 hours 06/17/22 0750 06/20/22 0744       Objective:  Vital Signs  Vitals:   06/30/22 0730 06/30/22 0745 06/30/22 0800 06/30/22 0822  BP: 117/75 121/69 125/73 132/74  Pulse: 67  69 74  Resp: 19 (!) 26 (!) 24   Temp:      TempSrc:      SpO2: 97%  96%   Weight:      Height:        Intake/Output Summary (Last 24 hours) at 06/30/2022 0910 Last data filed at 06/30/2022 0500 Gross per 24 hour  Intake 1437.5 ml  Output 1110 ml  Net 327.5 ml   Filed Weights   06/28/22 0500 06/29/22 0500 06/30/22 0433  Weight: 65.4 kg 68 kg 66.6 kg    General appearance: Patient is unresponsive Tracheostomy noted.  Connected to ventilator. Resp: Mildly tachypneic.  Crackles at the bases.  No wheezing or rhonchi. Cardio: S1-S2 is normal regular.  No S3-S4.  No rubs murmurs or bruit GI: Abdomen is soft.  Nontender nondistended.  Bowel sounds are present normal.  No masses organomegaly Extremities: Mild edema bilateral lower extremities Patient is unresponsive.   Lab Results:  Data Reviewed: I have personally reviewed following labs and reports of the imaging studies  CBC: Recent Labs  Lab 06/24/22 0358 06/25/22 0349 06/27/22 0355 06/28/22 1003 06/30/22 0010  WBC 16.5* 20.7* 12.4* 11.5*  15.1*  HGB 8.5* 7.6* 7.3* 8.2* 8.2*  HCT 29.5* 26.3* 25.2* 28.5* 28.2*  MCV 74.3* 75.8* 76.1* 76.0* 75.2*  PLT 351 354 380 433* 547*    Basic Metabolic Panel: Recent Labs  Lab 06/25/22 0349 06/26/22 0955 06/27/22 0355 06/28/22 1003 06/30/22 0010  NA 153* 154* 151* 149* 145  K 3.6 3.5 3.3* 3.5 3.5  CL 116* 120* 118* 116* 111  CO2 27 26 26 22 23   GLUCOSE 133* 120* 104* 125* 118*  BUN 43* 33* 30* 26* 24*  CREATININE 1.64* 1.00 0.86 0.80 0.72  CALCIUM 8.3* 8.6* 8.4* 8.6* 8.8*  MG  --   --  2.5*  --  2.1  PHOS  --   --  3.7  --  4.4    GFR: Estimated Creatinine Clearance: 78.2 mL/min (by C-G formula based on SCr of 0.72 mg/dL).  Liver Function Tests: Recent Labs  Lab 06/30/22 0010  ALBUMIN 1.9*  CBG: Recent Labs  Lab 06/25/22 0750 06/25/22 1151 06/25/22 1530 06/26/22 1952 06/28/22 0737  GLUCAP 97 87 102* 112* 117*     Recent Results (from the past 240 hour(s))  Culture, blood (Routine X 2) w Reflex to ID Panel     Status: None   Collection Time: 06/24/22  9:49 PM   Specimen: BLOOD LEFT HAND  Result Value Ref Range Status   Specimen Description BLOOD LEFT HAND  Final   Special Requests   Final    BOTTLES DRAWN AEROBIC AND ANAEROBIC Blood Culture results may not be optimal due to an excessive volume of blood received in culture bottles   Culture   Final    NO GROWTH 5 DAYS Performed at [facility], [address] [place], [place] [address]    Report Status 06/29/2022 FINAL  Final  Culture, blood (Routine X 2) w Reflex to ID Panel     Status: None   Collection Time: 06/24/22  9:52 PM   Specimen: BLOOD LEFT ARM  Result Value Ref Range Status   Specimen Description BLOOD LEFT ARM  Final   Special Requests   Final    BOTTLES DRAWN AEROBIC AND ANAEROBIC Blood Culture adequate volume   Culture   Final    NO GROWTH 5 DAYS Performed at [facility], [address], [place], [place] [address]    Report Status 06/29/2022 FINAL  Final       Radiology Studies: CT ABDOMEN WO CONTRAST  Result Date: 06/28/2022 CLINICAL DATA:  Dysphagia EXAM: CT ABDOMEN WITHOUT CONTRAST TECHNIQUE: Multidetector CT imaging of the abdomen was performed following the standard protocol without IV contrast. RADIATION DOSE REDUCTION: This exam was performed according to the departmental dose-optimization program which includes automated exposure control, adjustment of the mA and/or kV according to patient size and/or use of iterative reconstruction technique. COMPARISON:  X-ray 06/27/2022. FINDINGS: Lower chest: Breathing motion. There is a left-sided pigtail pleural catheter. Minimal left-sided pleural effusion. There is some dependent opacities along both lung bases, left-greater-than-right. Atelectasis is favored over infiltrate. Hepatobiliary: On this non IV contrast exam, the liver has some small low-attenuation lesions. Example in segment 4 is 1 of the larger foci measuring 12 mm consistent with a benign cystic lesion. Other lesions are less than a cm. No specific imaging follow-up. Gallbladder is contracted. Pancreas: Grossly of the pancreas is preserved. Poorly defined on this examination. Spleen: The spleen is nonenlarged. Adrenals/Urinary Tract: The adrenal glands are preserved. 3 mm nonobstructing lower pole right-sided renal stone. Punctate midportion left-sided renal stone. Stomach/Bowel: Enteric tube in place with tip extending to the pylorus. The stomach has some moderate fluid but is nondilated. Limited evaluation of the bowel with overlapping artifacts. There are a few prominent loops of small bowel left midabdomen. The visualized large bowel is nondilated Vascular/Lymphatic: Grossly normal caliber aorta and IVC. Evaluation for lymph nodes is limited by the artifacts. No obvious lymph node enlargement. Other: Mild central mesenteric stranding. Anasarca. Of note there is motion as well as artifacts from patient's arms being scanned at the patient's side.  Musculoskeletal: No acute or significant osseous findings. IMPRESSION: Limited examination due to motion artifact and lack of contrast. Enteric tube in place with tip near the pylorus. There are some prominent loops of small bowel left midabdomen with some stranding but appearance is nonspecific and poorly defined. Overall with the history and these findings a contrast examination may be useful with the contrast examination. Oral and/or IV would  be useful. Nonobstructing renal stones. Catheter in the left lung base. Dependent bilateral lower lobe lung opacities, left-greater-than-right. Trace left pleural fluid. Electronically Signed   By: Karen Kays M.D.   On: 06/28/2022 11:38       LOS: 13 days   Brenly Trawick Rito Ehrlich  [facility] Pager on [email or web address]  06/30/2022, 9:10 AM

## 2022-06-30 NOTE — Progress Notes (Signed)
STROKE TEAM PROGRESS NOTE   SUBJECTIVE (INTERVAL HISTORY) Patient neurological exam remains quite poor and she is developed unreactive unequal pupils.  She is still on ventilatory support and is not weaning.   She is quite unresponsive to stimulation on bedside exam.    There is no family at the bedside.  She remains on antibiotics for pneumonia.   PEG tube has been ordered.  Patient was unable to be weaned off ventilatory support...  Vital signs stable.   OBJECTIVE Temp:  [99.1 F (37.3 C)-100.8 F (38.2 C)] 99.9 F (37.7 C) (05/23 1506) Pulse Rate:  [47-84] 53 (05/23 1629) Cardiac Rhythm: Sinus bradycardia;Normal sinus rhythm (05/23 1600) Resp:  [15-36] 21 (05/23 1600) BP: (108-175)/(61-98) 121/71 (05/23 1600) SpO2:  [92 %-100 %] 96 % (05/23 1600) FiO2 (%):  [40 %] 40 % (05/23 1600) Weight:  [66.6 kg] 66.6 kg (05/23 0433)  Recent Labs  Lab 06/25/22 0750 06/25/22 1151 06/25/22 1530 06/26/22 1952 06/28/22 0737  GLUCAP 97 87 102* 112* 117*   Recent Labs  Lab 06/25/22 0349 06/26/22 0955 06/27/22 0355 06/28/22 1003 06/30/22 0010  NA 153* 154* 151* 149* 145  K 3.6 3.5 3.3* 3.5 3.5  CL 116* 120* 118* 116* 111  CO2 27 26 26 22 23   GLUCOSE 133* 120* 104* 125* 118*  BUN 43* 33* 30* 26* 24*  CREATININE 1.64* 1.00 0.86 0.80 0.72  CALCIUM 8.3* 8.6* 8.4* 8.6* 8.8*  MG  --   --  2.5*  --  2.1  PHOS  --   --  3.7  --  4.4   Recent Labs  Lab 06/30/22 0010  ALBUMIN 1.9*    Recent Labs  Lab 06/24/22 0358 06/25/22 0349 06/27/22 0355 06/28/22 1003 06/30/22 0010  WBC 16.5* 20.7* 12.4* 11.5* 15.1*  HGB 8.5* 7.6* 7.3* 8.2* 8.2*  HCT 29.5* 26.3* 25.2* 28.5* 28.2*  MCV 74.3* 75.8* 76.1* 76.0* 75.2*  PLT 351 354 380 433* 547*   No results for input(s): "CKTOTAL", "CKMB", "CKMBINDEX", "TROPONINI" in the last 168 hours.  No results for input(s): "LABPROT", "INR" in the last 72 hours. No results for input(s): "COLORURINE", "LABSPEC", "PHURINE", "GLUCOSEU", "HGBUR",  "BILIRUBINUR", "KETONESUR", "PROTEINUR", "UROBILINOGEN", "NITRITE", "LEUKOCYTESUR" in the last 72 hours.  Invalid input(s): "APPERANCEUR"     Component Value Date/Time   CHOL 126 06/18/2022 0242   TRIG 123 06/21/2022 0407   HDL 45 06/18/2022 0242   CHOLHDL 2.8 06/18/2022 0242   VLDL 16 06/18/2022 0242   LDLCALC 65 06/18/2022 0242   Lab Results  Component Value Date   HGBA1C 5.9 (H) 06/18/2022      Component Value Date/Time   LABOPIA NONE DETECTED 06/17/2022 0206   COCAINSCRNUR POSITIVE (A) 06/17/2022 0206   LABBENZ NONE DETECTED 06/17/2022 0206   AMPHETMU NONE DETECTED 06/17/2022 0206   THCU NONE DETECTED 06/17/2022 0206   LABBARB NONE DETECTED 06/17/2022 0206    No results for input(s): "ETH" in the last 168 hours.   DG Abd Portable 1V  Result Date: 06/30/2022 CLINICAL DATA:  Abdominal distension. EXAM: PORTABLE ABDOMEN - 1 VIEW COMPARISON:  CT 03/30/2022 FINDINGS: Enteric tube tip is identified within the left upper quadrant of the abdomen in the expected location of the body of stomach. Similar appearance of mild gaseous distension of the colon. No dilated small bowel loops noted. IMPRESSION: 1. Enteric tube tip within the body of stomach. 2. Similar appearance of mild gaseous distension of the colon. Electronically Signed   By: Veronda Prude.D.  On: 06/30/2022 14:17   CT ABDOMEN WO CONTRAST  Result Date: 06/28/2022 CLINICAL DATA:  Dysphagia EXAM: CT ABDOMEN WITHOUT CONTRAST TECHNIQUE: Multidetector CT imaging of the abdomen was performed following the standard protocol without IV contrast. RADIATION DOSE REDUCTION: This exam was performed according to the departmental dose-optimization program which includes automated exposure control, adjustment of the mA and/or kV according to patient size and/or use of iterative reconstruction technique. COMPARISON:  X-ray 06/27/2022. FINDINGS: Lower chest: Breathing motion. There is a left-sided pigtail pleural catheter. Minimal  left-sided pleural effusion. There is some dependent opacities along both lung bases, left-greater-than-right. Atelectasis is favored over infiltrate. Hepatobiliary: On this non IV contrast exam, the liver has some small low-attenuation lesions. Example in segment 4 is 1 of the larger foci measuring 12 mm consistent with a benign cystic lesion. Other lesions are less than a cm. No specific imaging follow-up. Gallbladder is contracted. Pancreas: Grossly of the pancreas is preserved. Poorly defined on this examination. Spleen: The spleen is nonenlarged. Adrenals/Urinary Tract: The adrenal glands are preserved. 3 mm nonobstructing lower pole right-sided renal stone. Punctate midportion left-sided renal stone. Stomach/Bowel: Enteric tube in place with tip extending to the pylorus. The stomach has some moderate fluid but is nondilated. Limited evaluation of the bowel with overlapping artifacts. There are a few prominent loops of small bowel left midabdomen. The visualized large bowel is nondilated Vascular/Lymphatic: Grossly normal caliber aorta and IVC. Evaluation for lymph nodes is limited by the artifacts. No obvious lymph node enlargement. Other: Mild central mesenteric stranding. Anasarca. Of note there is motion as well as artifacts from patient's arms being scanned at the patient's side. Musculoskeletal: No acute or significant osseous findings. IMPRESSION: Limited examination due to motion artifact and lack of contrast. Enteric tube in place with tip near the pylorus. There are some prominent loops of small bowel left midabdomen with some stranding but appearance is nonspecific and poorly defined. Overall with the history and these findings a contrast examination may be useful with the contrast examination. Oral and/or IV would be useful. Nonobstructing renal stones. Catheter in the left lung base. Dependent bilateral lower lobe lung opacities, left-greater-than-right. Trace left pleural fluid. Electronically  Signed   By: Karen Kays M.D.   On: 06/28/2022 11:38   DG CHEST PORT 1 VIEW  Result Date: 06/28/2022 CLINICAL DATA:  [id number] Chest tube in place [id number] EXAM: PORTABLE CHEST 1 VIEW COMPARISON:  CXR 06/27/22 FINDINGS: Tracheostomy terminates approximately 7 cm above the carina. Weighted enteric tube courses below diaphragm with the tip out field of view. Left basilar pleural drainage catheter in place No radiographically discernible pneumothorax. No large pleural effusion. Unchanged cardiac and mediastinal contours. Linear opacity in the right lung base is favored to represent atelectasis. Hazy opacity at the left lung base could represent atelectasis or infection. No radiographically apparent displaced rib fractures. Visualized upper abdomen is unremarkable. IMPRESSION: Left basilar pleural drainage catheter in place. No radiographically discernible pneumothorax or large pleural effusion. There is a hazy opacity at the left lung base could represent atelectasis or infection. Electronically Signed   By: Lorenza Cambridge M.D.   On: 06/28/2022 08:08   DG Chest Port 1 View  Result Date: 06/27/2022 CLINICAL DATA:  Hypoxia EXAM: PORTABLE CHEST 1 VIEW COMPARISON:  Previous studies including the examination done earlier today FINDINGS: Tip of tracheostomy is 7.4 cm above the carina. Enteric tube is noted traversing the esophagus. Left chest tube is noted with its tip in the medial left lower lung field.  There is interval decrease in opacity in the left lower lung fields suggesting decreasing pleural effusion. There are linear densities in both lower lung fields suggesting atelectasis/pneumonia. There are no signs of alveolar pulmonary edema. There is no pneumothorax. IMPRESSION: There is decrease in amount of left pleural effusion. Linear densities are seen in medial aspects of both lower lung fields suggesting atelectasis/pneumonia. Electronically Signed   By: Ernie Avena M.D.   On: 06/27/2022 17:34   DG  Chest Port 1 View  Result Date: 06/27/2022 CLINICAL DATA:  Post tracheostomy. EXAM: PORTABLE CHEST 1 VIEW COMPARISON:  06/23/2022 FINDINGS: Feeding tube is in place, tip beyond the edge of the image at least to the level of the stomach. A pigtail type catheter is identified at the LEFT lung base, stable in appearance. There has been interval placement of tracheostomy tube, tip approximately 5.3 centimeters above the carina. There continues to be dense opacification of the LEFT lung base. No evidence for pulmonary edema. No pneumothorax. IMPRESSION: 1. Interval placement of tracheostomy tube. 2. Persistent dense opacification of the LEFT lung base. Electronically Signed   By: Norva Pavlov M.D.   On: 06/27/2022 15:26   DG Abd Portable 1V  Result Date: 06/27/2022 CLINICAL DATA:  Evaluate feeding tube placement. EXAM: PORTABLE ABDOMEN - 1 VIEW COMPARISON:  06/24/2022 FINDINGS: Percutaneous pigtail thoracostomy tube is again noted overlying the left lower chest. Interval placement of feeding tube with tip below the level of the GE junction in the expected location of the body of stomach. Similar appearance of gaseous distension of the colon. IMPRESSION: Interval placement of feeding tube with tip below the level of the GE junction in the expected location of the body of stomach. Electronically Signed   By: Signa Kell M.D.   On: 06/27/2022 12:45   DG Abd 1 View  Result Date: 06/24/2022 CLINICAL DATA:  OG tube placement EXAM: ABDOMEN - 1 VIEW COMPARISON:  06/24/2022 FINDINGS: OG tube is in place with the tip in the fundus of the stomach. Left basilar chest tube remains in place. Left base atelectasis. IMPRESSION: OG tube tip in the fundus of the stomach. Electronically Signed   By: Charlett Nose M.D.   On: 06/24/2022 03:43   DG Abd 1 View  Result Date: 06/24/2022 CLINICAL DATA:  [id number] Encounter for imaging study to confirm orogastric (OG) tube placement [id number] EXAM: ABDOMEN - 1 VIEW COMPARISON:  X-ray  abdomen 06/18/2022 FINDINGS: Enteric tube with tip and side port overlying the expected location of the gastric lumen. Left upper lobe pigtail. Gaseous distension of the large bowel. No radio-opaque calculi or other significant radiographic abnormality are seen. IMPRESSION: Enteric tube in good position. Electronically Signed   By: Tish Frederickson M.D.   On: 06/24/2022 02:06   DG CHEST PORT 1 VIEW  Result Date: 06/23/2022 CLINICAL DATA:  [id number] Pneumothorax [id number] EXAM: PORTABLE CHEST 1 VIEW COMPARISON:  Jun 21, 2022 FINDINGS: The cardiomediastinal silhouette is unchanged in contour.LEFT-sided chest tube. LEFT neck CVC tip terminates over the SVC. ETT tip terminates 6.7 cm above the carina. Enteric tube tip and side port project over the esophagus. Favored trace LEFT pleural effusion. No significant pneumothorax. Bibasilar atelectasis. IMPRESSION: 1. Favored trace LEFT pleural effusion. No significant pneumothorax. 2. Enteric tube tip and side port project over the esophagus. Recommend advancement. Electronically Signed   By: Meda Klinefelter M.D.   On: 06/23/2022 14:00   DG Chest Port 1 View  Result Date: 06/21/2022 CLINICAL DATA:  Left pneumothorax EXAM:  PORTABLE CHEST 1 VIEW COMPARISON:  Previous studies including the examination done earlier today FINDINGS: There is interval decrease in size of small left apical pneumothorax. There is tiny residual pneumothorax in the medial left apex in the current study. Cardiac size is within normal limits. There are no signs of pulmonary edema or new focal infiltrates. Costophrenic angles are clear. Tip of left chest tube is seen in the medial left lower lung field. Tip of NG tube is seen in the fundus of the stomach. Tip of left IJ central venous catheter is seen in superior vena cava. Tip of endotracheal tube is 5.5 cm above the carina. IMPRESSION: There is interval decrease in size of left pneumothorax with tiny residual pneumothorax in the medial left apex.  There are no new infiltrates or signs of pulmonary edema. Electronically Signed   By: Ernie Avena M.D.   On: 06/21/2022 13:24   DG CHEST PORT 1 VIEW  Result Date: 06/21/2022 CLINICAL DATA:  Pneumothorax EXAM: PORTABLE CHEST 1 VIEW COMPARISON:  CXR 06/20/22 FINDINGS: Left-sided pleural pigtail drainage catheter in place with unchanged positioning. There is a small left apical pneumothorax, likely decreased in size compared prior exam. Left-sided central venous catheter with unchanged positioning. Enteric tube courses below diaphragm with the tip out of the field of view. Endotracheal tube terminates approximately 5 cm above the carina. No pleural effusion. No focal airspace opacity. Unchanged cardiac and mediastinal contours. No radiographically apparent new displaced rib fractures. Visualized upper abdomen is unremarkable. IMPRESSION: Small left apical pneumothorax, likely decreased in size compared to prior exam. Thoracostomy tube in place. Electronically Signed   By: Lorenza Cambridge M.D.   On: 06/21/2022 10:01   DG Chest Port 1 View  Result Date: 06/20/2022 CLINICAL DATA:  Chest tube placement. EXAM: PORTABLE CHEST 1 VIEW COMPARISON:  One-view chest x-ray 06/20/2022 at 12:25 p.m. FINDINGS: The heart is enlarged. Endotracheal tube is stable. Left IJ catheter is stable. Gastric tube terminates in the fundus the stomach. A left sided pleural pigtail catheter was placed. The pneumothorax is reduced but not eliminated. IMPRESSION: 1. Interval placement of left-sided pleural pigtail catheter with reduction in left-sided pneumothorax. 2. Stable cardiomegaly without failure. Electronically Signed   By: Marin Roberts M.D.   On: 06/20/2022 17:50   DG CHEST PORT 1 VIEW  Addendum Date: 06/20/2022   ADDENDUM REPORT: 06/20/2022 16:05 ADDENDUM: Critical Value/emergent results were called by telephone at the time of interpretation on 06/20/2022 at 4:03 pm to provider Priscella Mann, RN, who verbally  acknowledged these results. Electronically Signed   By: Signa Kell M.D.   On: 06/20/2022 16:05   Result Date: 06/20/2022 CLINICAL DATA:  Hypoxia. EXAM: PORTABLE CHEST 1 VIEW COMPARISON:  06/18/2018 for FINDINGS: ETT tip is stable above the carina. There is an enteric tube with tip coursing below the field of view. Left IJ catheter tip is in the distal SVC. There is a new moderate left-sided pneumothorax overlying the apex and lateral basal left lung. No signs of pleural effusion or edema. Persistent opacities within the right middle lobe. IMPRESSION: 1. New moderate left-sided pneumothorax. 2. Stable support apparatus. 3. Persistent right middle lobe opacities. Electronically Signed: By: Signa Kell M.D. On: 06/20/2022 15:41   CT HEAD WO CONTRAST ( )  Result Date: 06/20/2022 CLINICAL DATA:  Neuro deficit with acute stroke suspected EXAM: CT HEAD WITHOUT CONTRAST TECHNIQUE: Contiguous axial images were obtained from the base of the skull through the vertex without intravenous contrast. RADIATION DOSE REDUCTION: This exam  was performed according to the departmental dose-optimization program which includes automated exposure control, adjustment of the mA and/or kV according to patient size and/or use of iterative reconstruction technique. COMPARISON:  Yesterday FINDINGS: Brain: Large ICH centered at the right basal ganglia and adjacent white matter, with tracking towards the right brainstem is unchanged in shape and size, measuring up to 5 cm anterior to posterior and 4 cm craniocaudal. Regional edema is unchanged. Extensive cortical infarct in the right posterior frontal and parietal lobe. Smaller but more apparent left parietal cortex infarcts since brain MRI yesterday. Midline shift is 5 mm. Vascular: No hyperdense vessel or unexpected calcification. Skull: Normal. Negative for fracture or focal lesion. Sinuses/Orbits: No acute finding. IMPRESSION: 1. Multiple infarcts by brain MRI yesterday, suspect  progressive cortex infarction in the left parietal lobe. 2. Unchanged appearance of right ICH with adjacent edema. Midline shift is 5 mm. Electronically Signed   By: Tiburcio Pea M.D.   On: 06/20/2022 06:09   CT HEAD WO CONTRAST ( )  Result Date: 06/19/2022 CLINICAL DATA:  Follow-up ICH EXAM: CT HEAD WITHOUT CONTRAST TECHNIQUE: Contiguous axial images were obtained from the base of the skull through the vertex without intravenous contrast. RADIATION DOSE REDUCTION: This exam was performed according to the departmental dose-optimization program which includes automated exposure control, adjustment of the mA and/or kV according to patient size and/or use of iterative reconstruction technique. COMPARISON:  MRI from earlier today. FINDINGS: Brain: Large acute hemorrhage centered at the right basal ganglia and adjacent white matter without progression in size or shape when compared to prior. Cytotoxic edema most apparent in the right frontal parietal cortex, infarcts being under appreciated when compared to preceding brain MRI. Diffuse effacement of subarachnoid spaces, leftward midline shift of 5 mm. Unchanged mild dilatation of the left lateral ventricle. Vascular: No hyperdense vessel or unexpected calcification. Skull: Normal. Negative for fracture or focal lesion. Sinuses/Orbits: No acute finding. IMPRESSION: Right ICH and extensive cortical infarction that is stable from brain MRI earlier today. No new abnormality. Leftward midline shift of 5 mm. Electronically Signed   By: Tiburcio Pea M.D.   On: 06/19/2022 11:13   MR BRAIN W WO CONTRAST  Result Date: 06/19/2022 CLINICAL DATA:  Hemorrhagic stroke workup EXAM: MRI HEAD WITHOUT AND WITH CONTRAST TECHNIQUE: Multiplanar, multiecho pulse sequences of the brain and surrounding structures were obtained without and with intravenous contrast. CONTRAST:  6mL GADAVIST GADOBUTROL 1 MMOL/ML IV SOLN COMPARISON:  Head CT and CTA from 2 days prior FINDINGS: Brain:  Multiple areas of restricted diffusion most confluent in the right parietal and posterior temporal cortex. Restricted diffusion is seen surrounding the large and known right basal ganglia hemorrhage which measures up to 5.3 cm anterior to posterior with cleft of fluid surrounding the hematoma. Infarcts in other arterial distributions are also present, including along the left cerebral convexity (especially parietooccipital)and at the right brainstem. Midline shift measures 5 mm. There is periventricular FLAIR hyperintensity suggesting chronic small vessel disease. There is asymmetric mild dilatation of the left temporal horn. Vascular: Filling defect in the right sigmoid sinus and upper internal jugular vein. Skull and upper cervical spine: Normal marrow signal Sinuses/Orbits: Mild mucosal thickening in the mastoid air cells. Retention cysts in the right maxillary sinus. Diffuse mucosal thickening in the paranasal sinuses. Negative orbits. IMPRESSION: 1. 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 The most confluent area is in the right MCA territory with infarct likely underlying the right basal ganglia hematoma. 2. 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 The pattern of infarcts and hemorrhage do not correlate with the clot location however. 3. Mild dilatation of the temporal horn of the left lateral ventricle. 5 mm of midline shift. Electronically Signed   By: Tiburcio Pea M.D.   On: 06/19/2022 06:59   DG Abd 1 View  Result Date: 06/18/2022 CLINICAL DATA:  Check gastric catheter placement EXAM: ABDOMEN - 1 VIEW COMPARISON:  Film from earlier in the same day. FINDINGS: Gastric catheter has been advanced and now lies completely within the stomach. Scattered large and small bowel gas is noted. IMPRESSION: Gastric catheter within the stomach as described. Electronically Signed   By: Alcide Clever M.D.   On:  06/18/2022 23:47   DG Abd 1 View  Result Date: 06/18/2022 CLINICAL DATA:  Feeding tube placement. EXAM: ABDOMEN - 1 VIEW COMPARISON:  Jun 18, 2022 (6:44 p.m.) FINDINGS: A nasogastric tube is seen with its distal tip overlying the expected region of the body of the stomach. The distal side hole sits at the level of the gastroesophageal junction. The bowel gas pattern is normal. No radio-opaque calculi or other significant radiographic abnormality are seen. IMPRESSION: Nasogastric tube positioning, as described above. Further advancement of the NG tube by approximately 7 cm is recommended to decrease the risk of aspiration. Electronically Signed   By: Aram Candela M.D.   On: 06/18/2022 22:23   DG Abd Portable 1V  Result Date: 06/18/2022 CLINICAL DATA:  Check gastric catheter placement EXAM: PORTABLE ABDOMEN - 1 VIEW COMPARISON:  None Available. FINDINGS: Scattered large and small bowel gas is noted. Gastric catheter is noted with the tip in the stomach. Proximal side port lies in the distal esophagus. This should be advanced several cm deeper into the stomach. IMPRESSION: Gastric catheter as described. This should be advanced deeper into the stomach. Electronically Signed   By: Alcide Clever M.D.   On: 06/18/2022 21:24   DG CHEST PORT 1 VIEW  Result Date: 06/18/2022 CLINICAL DATA:  Evaluate central line placement EXAM: PORTABLE CHEST 1 VIEW COMPARISON:  Jun 18, 2022 FINDINGS: The side port of the NG tube is near the GE junction. The distal tip is in the stomach. The ETT is in good position. A left central line is been placed in the interval terminating in the central SVC. No pneumothorax. The left lung is clear. Right middle lobe infiltrate remains. No other interval changes. IMPRESSION: 1. Support apparatus as above. The side port of the NG tube is near the GE junction. Recommend advancing the tube. 2. The left lung is clear. Right middle lobe infiltrate remains. 3. No other interval changes.  Electronically Signed   By: Gerome Sam III M.D.   On: 06/18/2022 17:20   Overnight EEG with video  Result Date: 06/18/2022 Charlsie Quest, MD     06/19/2022  9:37 AM Patient Name: CORDILIA RHEAULT MRN: [id number] Epilepsy Attending: Charlsie Quest Referring Physician/Provider: Erick Blinks, MD Duration: 06/17/2022 1215 to 06/18/2022 1215 Patient history: 47 y.o. female with hx of cocaine use p/w unresponsive and large R IPH with brain compression and 5mm midline shift. EEG to evaluate for seizure Level of alertness: Awake, asleep AEDs during EEG study: None Technical aspects: This EEG study was done with scalp electrodes positioned according to the 10-20 International system of electrode placement. Electrical activity was reviewed with band pass filter of 1-70Hz , sensitivity of 7 uV/mm, display speed of 45mm/sec  with a 60Hz  notched filter applied as appropriate. EEG data were recorded continuously and digitally stored.  Video monitoring was available and reviewed as appropriate. Description: The posterior dominant rhythm consists of 7.5 Hz activity of moderate voltage (25-35 uV) seen predominantly in posterior head regions, symmetric and reactive to eye opening and eye closing. Sleep was characterized by vertex waves, sleep spindles (12 to 14 Hz), maximal frontocentral region. EEG showed continuous 3 to 6 Hz theta-delta slowing in right hemisphere, maximal right temporal region. Intermittent generalized 3-6hz  theta-delta slowing was also noted. Hyperventilation and photic stimulation were not performed.   ABNORMALITY - Continuous slow, right hemisphere, maximal right temporal region - Intermittent slow, generalized IMPRESSION: This study is suggestive of cortical dysfunction arising from right hemisphere, maximal right temporal region likely secondary to underlying structural abnormality. Additionally there is mild to moderate diffuse encephalopathy. No seizures or epileptiform discharges were seen  throughout the recording. Charlsie Quest   DG Chest Port 1 View  Result Date: 06/18/2022 CLINICAL DATA:  Pulmonary infiltrates EXAM: PORTABLE CHEST 1 VIEW COMPARISON:  06/17/2022 FINDINGS: Endotracheal tube is in stable position. NG tube tip in the proximal stomach with the side port near the GE junction. The NG tube appears to be buckled/looped within the neck near the thoracic inlet. Heart mediastinal contours within normal limits. Right middle lobe airspace opacity again noted, stable. No confluent opacity on the left. No effusions or acute bony abnormality. IMPRESSION: Continued right middle lobe infiltrate, unchanged. NG tube appears to be buckled slashed looped in the upper esophagus near the thoracic inlet. Tip is in the proximal stomach. Electronically Signed   By: Charlett Nose M.D.   On: 06/18/2022 07:11   DG Abd 1 View  Result Date: 06/17/2022 CLINICAL DATA:  Nasogastric tube placement. EXAM: ABDOMEN - 1 VIEW COMPARISON:  Radiograph earlier today. FINDINGS: Tip of the enteric tube is below the diaphragm in the stomach, the side port is just at the gastroesophageal junction. Normal upper abdominal bowel gas pattern. IMPRESSION: Tip of the enteric tube below the diaphragm in the stomach, side-port just at the gastroesophageal junction. Electronically Signed   By: Narda Rutherford M.D.   On: 06/17/2022 15:43   ECHOCARDIOGRAM COMPLETE  Result Date: 06/17/2022    ECHOCARDIOGRAM REPORT   Patient Name:   YVONNE LEA Date of Exam: 06/17/2022 Medical Rec #:  [id number]          Height:       65.0 in Accession #:    [id number]         Weight:       140.0 lb Date of Birth:  February 15, 1975          BSA:          1.700 m Patient Age:    46 years           BP:           122/111 mmHg Patient Gender: F                  HR:           102 bpm. Exam Location:  Inpatient Procedure: 2D Echo, Cardiac Doppler and Color Doppler Indications:    Stroke  History:        Patient has no prior history of Echocardiogram  examinations.                 ICH, sepsis; Risk Factors:Current Smoker and Substance abuse.  Sonographer:  Wallie Char Referring Phys: [id number] [facility]  Sonographer Comments: Technically challenging study due to limited acoustic windows and echo performed with patient supine and on artificial respirator. IMPRESSIONS  1. Left ventricular ejection fraction, by estimation, is 50 to 55%. The left ventricle has low normal function. The left ventricle has no regional wall motion abnormalities. There is moderate left ventricular hypertrophy. Left ventricular diastolic parameters are consistent with Grade I diastolic dysfunction (impaired relaxation).  2. Right ventricular systolic function is normal. The right ventricular size is normal. There is mildly elevated pulmonary artery systolic pressure.  3. The mitral valve is normal in structure. Trivial mitral valve regurgitation. No evidence of mitral stenosis.  4. The aortic valve is tricuspid. Aortic valve regurgitation is not visualized. No aortic stenosis is present. FINDINGS  Left Ventricle: Left ventricular ejection fraction, by estimation, is 50 to 55%. The left ventricle has low normal function. The left ventricle has no regional wall motion abnormalities. The left ventricular internal cavity size was normal in size. There is moderate left ventricular hypertrophy. Left ventricular diastolic parameters are consistent with Grade I diastolic dysfunction (impaired relaxation). Right Ventricle: The right ventricular size is normal. No increase in right ventricular wall thickness. Right ventricular systolic function is normal. There is mildly elevated pulmonary artery systolic pressure. The tricuspid regurgitant velocity is 2.74  m/s, and with an assumed right atrial pressure of 8 mmHg, the estimated right ventricular systolic pressure is 38.0 mmHg. Left Atrium: Left atrial size was normal in size. Right Atrium: Right atrial size was normal in size.  Pericardium: There is no evidence of pericardial effusion. Mitral Valve: The mitral valve is normal in structure. Trivial mitral valve regurgitation. No evidence of mitral valve stenosis. MV peak gradient, 5.3 mmHg. The mean mitral valve gradient is 2.0 mmHg. Tricuspid Valve: The tricuspid valve is normal in structure. Tricuspid valve regurgitation is trivial. Aortic Valve: The aortic valve is tricuspid. Aortic valve regurgitation is not visualized. No aortic stenosis is present. Aortic valve mean gradient measures 4.0 mmHg. Aortic valve peak gradient measures 6.2 mmHg. Aortic valve area, by VTI measures 2.39 cm. Pulmonic Valve: The pulmonic valve was normal in structure. Pulmonic valve regurgitation is not visualized. Aorta: The aortic root and ascending aorta are structurally normal, with no evidence of dilitation. IAS/Shunts: The interatrial septum was not well visualized.  LEFT VENTRICLE PLAX 2D LVIDd:         4.50 cm     Diastology LVIDs:         3.30 cm     LV e' medial:    6.63 cm/s LV PW:         1.10 cm     LV E/e' medial:  11.0 LV IVS:        1.00 cm     LV e' lateral:   5.86 cm/s LVOT diam:     1.90 cm     LV E/e' lateral: 12.4 LV SV:         40 LV SV Index:   23 LVOT Area:     2.84 cm  LV Volumes (MOD) LV vol d, MOD A2C: 68.4 ml LV vol d, MOD A4C: 77.5 ml LV vol s, MOD A2C: 33.9 ml LV vol s, MOD A4C: 37.8 ml LV SV MOD A2C:     34.5 ml LV SV MOD A4C:     77.5 ml LV SV MOD BP:      39.2 ml RIGHT VENTRICLE  IVC RV S prime:     13.90 cm/s  IVC diam: 1.50 cm TAPSE (M-mode): 1.6 cm LEFT ATRIUM             Index        RIGHT ATRIUM           Index LA diam:        3.00 cm 1.76 cm/m   RA Area:     10.40 cm LA Vol (A2C):   23.2 ml 13.65 ml/m  RA Volume:   20.40 ml  12.00 ml/m LA Vol (A4C):   19.2 ml 11.29 ml/m LA Biplane Vol: 21.2 ml 12.47 ml/m  AORTIC VALVE AV Area (Vmax):    2.85 cm AV Area (Vmean):   2.41 cm AV Area (VTI):     2.39 cm AV Vmax:           124.00 cm/s AV Vmean:           93.450 cm/s AV VTI:            0.166 m AV Peak Grad:      6.2 mmHg AV Mean Grad:      4.0 mmHg LVOT Vmax:         124.50 cm/s LVOT Vmean:        79.500 cm/s LVOT VTI:          0.140 m LVOT/AV VTI ratio: 0.84  AORTA Ao Root diam: 3.30 cm Ao Asc diam:  2.80 cm MITRAL VALVE               TRICUSPID VALVE MV Area (PHT): 4.06 cm    TR Peak grad:   30.0 mmHg MV Area VTI:   1.72 cm    TR Vmax:        274.00 cm/s MV Peak grad:  5.3 mmHg MV Mean grad:  2.0 mmHg    SHUNTS MV Vmax:       1.15 m/s    Systemic VTI:  0.14 m MV Vmean:      68.2 cm/s   Systemic Diam: 1.90 cm MV Decel Time: 187 msec MV E velocity: 72.90 cm/s MV A velocity: 89.60 cm/s MV E/A ratio:  0.81 Epifanio Lesches MD Electronically signed by Epifanio Lesches MD Signature Date/Time: 06/17/2022/10:47:05 AM    Final    CT VENOGRAM HEAD  Result Date: 06/17/2022 CLINICAL DATA:  Intracranial hemorrhage. EXAM: CT VENOGRAM HEAD TECHNIQUE: Venographic phase images of the brain were obtained following the administration of intravenous contrast. Multiplanar reformats and maximum intensity projections were generated. RADIATION DOSE REDUCTION: This exam was performed according to the departmental dose-optimization program which includes automated exposure control, adjustment of the mA and/or kV according to patient size and/or use of iterative reconstruction technique. CONTRAST:  75mL OMNIPAQUE IOHEXOL 350 MG/ML SOLN COMPARISON:  None Available. FINDINGS: Filling defect of the right transverse sinus and right transverse sigmoid junction which are somewhat large and tubular for arachnoid granulations, although an arachnoid granulation at the left transverse sigmoid junction appears nearly similar. The location would also not explain the acute hemorrhage. No visible cortical or central venous thrombosis. IMPRESSION: No cortical or central venous thrombosis to explain the ICH. There are filling defects at the right transverse and transverse sigmoid dural sinuses  which are prominent for arachnoid granulations but not definitive for thrombus. Attention at follow-up imaging. Electronically Signed   By: Tiburcio Pea M.D.   On: 06/17/2022 05:30   CT ANGIO HEAD NECK W WO CM  Result Date:  06/17/2022 CLINICAL DATA:  Found unresponsive. Intracranial hemorrhage. History of cocaine use EXAM: CT ANGIOGRAPHY HEAD AND NECK WITH AND WITHOUT CONTRAST TECHNIQUE: Multidetector CT imaging of the head and neck was performed using the standard protocol during bolus administration of intravenous contrast. Multiplanar CT image reconstructions and MIPs were obtained to evaluate the vascular anatomy. Carotid stenosis measurements (when applicable) are obtained utilizing NASCET criteria, using the distal internal carotid diameter as the denominator. RADIATION DOSE REDUCTION: This exam was performed according to the departmental dose-optimization program which includes automated exposure control, adjustment of the mA and/or kV according to patient size and/or use of iterative reconstruction technique. CONTRAST:  75mL OMNIPAQUE IOHEXOL 350 MG/ML SOLN COMPARISON:  Head CT from earlier today FINDINGS: CTA NECK FINDINGS Aortic arch: Unremarkable Right carotid system: Mixed density plaque at the bifurcation with some low-density plaque bulging into the ICA bulb. No ulceration or flow limiting stenosis Left carotid system: Mainly low-density plaque at the bifurcation without stenosis or ulceration. Vertebral arteries: No proximal subclavian stenosis. The vertebral arteries are somewhat tortuous but smoothly contoured and diffusely patent Skeleton: No acute or aggressive finding. Other neck: No acute finding Upper chest: Mild opacification of dependent lungs which could be atelectasis. Centrilobular emphysema. Review of the MIP images confirms the above findings CTA HEAD FINDINGS Anterior circulation: No aneurysm or spot sign seen underlying the ICH. There is a aneurysm projecting leftward from the  left carotid terminus which measures 4 mm. No adjacent hemorrhage. No major branch occlusion. There is undulation of the bilateral intracranial branches which is likely atheromatous given findings in the neck. No segmental beading. Some less intense flow and right MCA branches is likely related to mass effect by the ICH. Posterior circulation: The vertebral and basilar arteries arediffusely patent. Undulation of the left more than right PCA with up to moderate narrowing on the left, likely atheromatous given the findings in the neck. Venous sinuses: Reference dedicated CT venogram Anatomic variants: No acute finding Review of the MIP images confirms the above findings IMPRESSION: 1. No vascular lesion or spot sign seen at the acute ICH. 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 3. 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 Electronically Signed   By: Tiburcio Pea M.D.   On: 06/17/2022 05:18   CT Head Wo Contrast  Result Date: 06/17/2022 CLINICAL DATA:  Altered mental status EXAM: CT HEAD WITHOUT CONTRAST TECHNIQUE: Contiguous axial images were obtained from the base of the skull through the vertex without intravenous contrast. RADIATION DOSE REDUCTION: This exam was performed according to the departmental dose-optimization program which includes automated exposure control, adjustment of the mA and/or kV according to patient size and/or use of iterative reconstruction technique. COMPARISON:  None Available. FINDINGS: Brain: There is a large area of parenchymal hemorrhage identified in the right cerebral hemisphere centered primarily within the basal ganglia and extending superiorly into the centrum semi ovale. This measures approximately 4.7 x 4.0 cm in greatest AP and transverse dimensions respectively. It extends for approximately 4.7 cm in craniocaudad dimension. Significant mass-effect upon the right lateral ventricle is noted. Mild midline shift of approximately 5 mm is noted.  Some surrounding edema is seen as well. No other hemorrhage is noted. Relative loss of the sulcal markings is noted likely related to some increased intracranial pressure. Vascular: No hyperdense vessel or unexpected calcification. Skull: Normal. Negative for fracture or focal lesion. Sinuses/Orbits: No acute finding. Other: None. IMPRESSION: Right-sided intraparenchymal hemorrhage as described with associated edema and mild midline shift of 5  mm from right to left. Critical Value/emergent results were called by telephone at the time of interpretation on 06/17/2022 at 2:27 am to Dr. Donna Bernard , who verbally acknowledged these results. Electronically Signed   By: Alcide Clever M.D.   On: 06/17/2022 02:29   DG Abdomen 1 View  Result Date: 06/17/2022 CLINICAL DATA:  Check gastric catheter placement EXAM: ABDOMEN - 1 VIEW COMPARISON:  None Available. FINDINGS: Gastric catheter is noted extending into the stomach. Proximal side port lies at the gastroesophageal junction. This should be advanced further into the stomach. No free air is seen. No bony abnormality is noted. IMPRESSION: Gastric catheter as described. Electronically Signed   By: Alcide Clever M.D.   On: 06/17/2022 02:16   DG Chest Port 1 View  Result Date: 06/17/2022 CLINICAL DATA:  Check endotracheal tube placement EXAM: PORTABLE CHEST 1 VIEW COMPARISON:  10/21/2020 FINDINGS: Cardiac shadow is within normal limits. Endotracheal tube is noted 1.5 cm above the carina. Gastric catheter is noted extending into the stomach. Patchy increased density is noted in the right middle lobe beneath the minor fissure consistent with acute infiltrate. No sizable effusion is noted. No bony abnormality is seen. IMPRESSION: Right middle lobe infiltrate. Tubes and lines as described. Electronically Signed   By: Alcide Clever M.D.   On: 06/17/2022 02:16     PHYSICAL EXAM  Temp:  [99.1 F (37.3 C)-100.8 F (38.2 C)] 99.9 F (37.7 C) (05/23 1506) Pulse Rate:   [47-84] 53 (05/23 1629) Resp:  [15-36] 21 (05/23 1600) BP: (108-175)/(61-98) 121/71 (05/23 1600) SpO2:  [92 %-100 %] 96 % (05/23 1600) FiO2 (%):  [40 %] 40 % (05/23 1600) Weight:  [66.6 kg] 66.6 kg (05/23 0433)   General:  She is s/p tracheostomy.  On ventilator.  Not sedated   Neuro: Eyes are closed.  Unresponsive.    .  Does not open eyes to command, unable to nod yes or no to questions.  Will barely open eyes to stimuli but not track examiner.  Pupils right is 3 mm and left is 2 mm in both unreactive to light.  Does not track examiner.  Oculocephalic intact.  Gag cough intact.  Corneals positive on the right absent on the left.  No movement in the left arm or leg.   No withdrawal to painful stimuli.   Not following any commands    ASSESSMENT/PLAN Ms. Rebecca [name] is a 47 y.o. female with history of cocaine abuse admitted for unresponsiveness.  Intubated in ER for airway protection.  No tPA given due to ICH.    ICH:  right BG and CR ICH, still more likely due to cocaine induced hypertension vs. 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 Vasospasm from ICH vs. 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  2D Echo  EF 50 to 55% LDL 65 HgbA1c 5.9 UDS positive for  cocaine Heparin subcu for VTE prophylaxis No antithrombotic prior to admission, now on No antithrombotic due to ICH. Therapy recommendations: Pending Disposition: Pending, prognosis poor, palliative care on board. Family meeting held again today.  Continue full support and care.  Cerebral edema CT right BG and CR large ICH, 5 mm midline shift CT repeat 5/12 stable ICH with leftward midline shift of 5 mm CT repeat 5/13 unchanged appearance of right ICH with adjacent edema. Midline shift is 5 mm. Na 151->155->157->159-> 155->158->150->159->162->161->159->158->155 -> 154>153 off 3% saline Na monitoring LTM EEG cortical dysfunction arising from right hemisphere with diffuse encephalopathy, no seizures  Respiratory failure Aspiration pneumonia Leukocytosis Pneumothorax  Intubated on vent Still on fentanyl, low dose CCM on board CXR right middle lobe infiltrate On azithromycin and Rocephin -> unasyn and zyvox Tmax 100.6->100.3->afebrile->100.3 -> 101.9 WBC 22.2->20.1->15.8->14.0->13.5->15.0 -> 16.5>20 S/p chest tube  Hypertension BP unstable Off cleviprex Labetalol and hydralazine IV PRN BP goal < 160 Long term BP goal normotensive Due to increased creatinine we will hold off on lisinopril for now.  Can use as needed to control blood pressure.  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  Hospital day # 13 Patient`s   neurological exam has declined further with unreactive pupils and no response to stimulation.  Dr. Rito Ehrlich spoke to  the patient's daughter over the phone and she understands poor prognosis and agrees to DNR and family wants to visit and likely withdraw ventilatory support tomorrow.   Discussed with Dr. Rito Ehrlich This patient is critically ill and at significant risk of neurological worsening, death and care requires constant monitoring of vital signs, hemodynamics,respiratory and cardiac monitoring, extensive review of multiple databases, frequent neurological assessment, discussion with family, other specialists and medical decision making of high complexity.I have made any additions or clarifications directly to the above note.This critical care time does not reflect procedure time, or teaching time or supervisory time of PA/NP/Med Resident etc but could involve care discussion time.  I spent 30 minutes of neurocritical care time  in the care of  this patient.    Delia Heady, MD         To contact Stroke Continuity provider, please refer to [email or web address]. After hours, contact General Neurology

## 2022-06-30 NOTE — Progress Notes (Signed)
Notified MD that patient's belly is more distended than this morning.  Orders received for abdominal xray.

## 2022-06-30 NOTE — Progress Notes (Signed)
SLP Cancellation Note  Patient Details Name: Rebecca [name] MRN: [id number] DOB: 10/31/1975   Cancelled treatment:  Pt remains on vent - not yet weaning. Will follow pt closely for readiness for SLP interventions as appropriate.         Rebecca Tisdel L. Samson Frederic, MA CCC/SLP Clinical Specialist - Acute Care SLP Acute Rehabilitation Services Office number [phone]  Rebecca [name] 06/30/2022, 10:47 AM

## 2022-07-01 DIAGNOSIS — Z515 Encounter for palliative care: Secondary | ICD-10-CM | POA: Diagnosis not present

## 2022-07-01 DIAGNOSIS — I610 Nontraumatic intracerebral hemorrhage in hemisphere, subcortical: Secondary | ICD-10-CM | POA: Diagnosis not present

## 2022-07-01 DIAGNOSIS — D509 Iron deficiency anemia, unspecified: Secondary | ICD-10-CM | POA: Diagnosis not present

## 2022-07-01 DIAGNOSIS — J15212 Pneumonia due to Methicillin resistant Staphylococcus aureus: Secondary | ICD-10-CM

## 2022-07-01 DIAGNOSIS — J9601 Acute respiratory failure with hypoxia: Secondary | ICD-10-CM | POA: Diagnosis not present

## 2022-07-01 DIAGNOSIS — Z9689 Presence of other specified functional implants: Secondary | ICD-10-CM | POA: Diagnosis not present

## 2022-07-01 LAB — RENAL FUNCTION PANEL
Albumin: 1.8 g/dL — ABNORMAL LOW (ref 3.5–5.0)
Anion gap: 9 (ref 5–15)
BUN: 26 mg/dL — ABNORMAL HIGH (ref 6–20)
CO2: 24 mmol/L (ref 22–32)
Calcium: 8.9 mg/dL (ref 8.9–10.3)
Chloride: 110 mmol/L (ref 98–111)
Creatinine, Ser: 0.6 mg/dL (ref 0.44–1.00)
GFR, Estimated: >60 mL/min (ref >60)
Glucose, Bld: 116 mg/dL — ABNORMAL HIGH (ref 70–99)
Phosphorus: 4.1 mg/dL (ref 2.5–4.6)
Potassium: 4.1 mmol/L (ref 3.5–5.1)
Sodium: 143 mmol/L (ref 135–145)

## 2022-07-01 LAB — MAGNESIUM: Magnesium: 2.3 mg/dL (ref 1.7–2.4)

## 2022-07-01 MED ORDER — GLYCOPYRROLATE 0.2 MG/ML IJ SOLN
0.2000 mg | INTRAMUSCULAR | Status: DC | PRN
  Administered 2022-07-01: 0.2 mg via INTRAVENOUS
  Filled 2022-07-01: qty 1

## 2022-07-01 MED ORDER — MORPHINE 100MG IN NS 100ML (1MG/ML) PREMIX INFUSION
0.0000 mg/h | INTRAVENOUS | Status: DC
  Administered 2022-07-01: 5 mg/h via INTRAVENOUS
  Filled 2022-07-01: qty 100

## 2022-07-01 MED ORDER — GLYCOPYRROLATE 0.2 MG/ML IJ SOLN: 0.2000 mg | INTRAMUSCULAR | Status: DC | PRN

## 2022-07-01 MED ORDER — LORAZEPAM 2 MG/ML IJ SOLN
2.0000 mg | INTRAMUSCULAR | Status: DC | PRN
  Administered 2022-07-01: 2 mg via INTRAVENOUS
  Filled 2022-07-01: qty 1

## 2022-07-01 MED ORDER — IPRATROPIUM-ALBUTEROL 0.5-2.5 (3) MG/3ML IN SOLN: 3.0000 mL | RESPIRATORY_TRACT | Status: DC | PRN

## 2022-07-01 MED ORDER — GLYCOPYRROLATE 1 MG PO TABS: 1.0000 mg | ORAL_TABLET | ORAL | Status: DC | PRN

## 2022-07-01 MED ORDER — MORPHINE BOLUS VIA INFUSION
5.0000 mg | INTRAVENOUS | Status: DC | PRN
  Administered 2022-07-01: 5 mg via INTRAVENOUS

## 2022-07-01 MED ORDER — POLYVINYL ALCOHOL 1.4 % OP SOLN: 1.0000 [drp] | Freq: Four times a day (QID) | OPHTHALMIC | Status: DC | PRN

## 2022-07-09 NOTE — Progress Notes (Signed)
Pt withdrew of vent support for comfort care. Prior to disconnecting the vent pt was placed on CPAP 5/5 to assess pt's comfort, pt seemed comfortable and RT proceeded with disconnecting the vent.

## 2022-07-09 NOTE — TOC Progression Note (Signed)
Transition of Care [facility]) - Progression Note    Patient Details  Name: Rebecca [name] MRN: [id number] Date of Birth: [birth year]  Transition of Care [facility]) CM/SW Contact  Tom-Johnson, Hershal Coria, RN Phone Number: 06/15/2022, 12:42 PM  Clinical Narrative:     Patient continues on full Ventilatory support. Family considering withdrawal of Vent support and transition to Comfort Care later today.   CM will continue to follow and render compassionate support at his transitioning time.        Expected Discharge Plan and Services                                               Social Determinants of Health (SDOH) Interventions SDOH Screenings   Tobacco Use: High Risk (10/21/2020)    Readmission Risk Interventions     No data to display

## 2022-07-09 NOTE — Progress Notes (Signed)
                                                     Palliative Care Progress Note, Assessment & Plan   Patient Name: Rebecca [name]       Date: 06/17/2022 DOB: 1975-06-13  Age: 47 y.o. MRN#: [id number] Attending Physician: Osvaldo Shipper, MD Primary Care Physician: Patient, No Pcp Per Admit Date: 06/13/2022  Subjective: Patient is lying in bed in NAD with ventilator/trach in place.  Patient's brother has just arrived bedside to visit.  HPI: 47 yo female with hx of polysubstance abuse presented brought to [facility] ED with AMS. Identified to have significant R sided ICH. Is receiving care in the ICU as is intubated. Neurologically has a poor prognosis therefore the Palliative care team has been asked to get involved for further conversations.   Summary of counseling/coordination of care: After reviewing the patient's chart and assessing the patient at bedside, I counseled with patient's dayshift RN in person. No acute palliative needs at this time. As per RN, Dr. Rito Ehrlich has had multiple conversations with daughter today and this morning.   Plan remains for patient's daughter to arrive and then one way comfort extubation/removal of ventilator/comfort measures to be initiated.   I counseled with Dr. Rito Ehrlich via secure chat throughout the day. Awaiting arrival of family before shifting to full comfort measures. Anticipate hospital death. PMT will remain available to support patient/family through this transitional time.   Physical Exam Vitals reviewed.  Constitutional:      General: She is not in acute distress.    Appearance: She is ill-appearing.  HENT:     Head: Normocephalic.     Mouth/Throat:     Mouth: Mucous membranes are moist.  Abdominal:     Palpations: Abdomen is soft.  Skin:    General: Skin is warm and dry.             Total Time 25  minutes   Naylin Burkle L. Manon Hilding, FNP-BC Palliative Medicine Team Team Phone # [phone]

## 2022-07-09 NOTE — Progress Notes (Signed)
Patient pronounced by [facility], RN and myself at 1620.  Family at bedside.  Patient is ME case, and ME has been notified.  Patient transferred to morgue.

## 2022-07-09 NOTE — Death Summary Note (Addendum)
DEATH SUMMARY   Patient Details  Name: Rebecca [name] MRN: [id number] DOB: Aug 02, 1975 WUJ:[id number], No Pcp Per Admission/Discharge Information   Admit Date:  Jul 01, 2022  Date of Death: Date of Death: 2022-07-15  Time of Death: Time of Death: Jul 24, 1618  Length of Stay: 08-05-2022   Principle Cause of death: Acute intracranial hemorrhage  Hospital Diagnoses:   ICH (intracerebral hemorrhage) (HCC) Acute kidney injury, resolved Anemia of critical illness Demand ischemia MRSA pneumonia Left pneumothorax status post chest tube   Brief History: 47 yo female with hx of polysubstance abuse presented brought to [facility] ED with AMS. Intubated for airway protection. CT head showed large Rt ICH with 5 mm midline shift. UDS positive for cocaine. Transferred to [facility] for further management.    Consultants: Neurology.  Critical care medicine.   Procedures: Tracheostomy.  Chest tube placement.   Hospital Course:   Acute right basal ganglia intraparenchymal hemorrhage/cerebral edema with brain compression/right MCA stroke/acute right sigmoid thrombosis Patient was admitted to the ICU.  She was intubated.  Seen by neurology. Echocardiogram showed EF of 50 to 55%.  LDL 65.  HbA1c 5.9.  Urine drug screen positive for cocaine. Prognosis started to be very poor per neurology.  Palliative care was consulted.  Discussions were held with family.  Initially they wanted to continue full scope of care. D[place] of [place].  After further discussions patient was changed to DNR.  After patient was visited by family withdrawal of care was initiated.  Patient subsequently expired on 15-Jul-2022 at 4:20 PM.     Acute respiratory failure with hypoxia/bilateral multifocal pneumonia with MRSA/left pneumothorax status post chest tube   Acute kidney injury/hyponatremia Resolved.   Microcytic anemia/anemia of critical illness   Demand ischemia with elevated troponin/essential hypertension EF noted to be mildly  low at 50 to 55%.      The results of significant diagnostics from this hospitalization (including imaging, microbiology, ancillary and laboratory) are listed below for reference.   Significant Diagnostic Studies: DG Abd Portable 1V  Result Date: 06/30/2022 CLINICAL DATA:  Abdominal distension. EXAM: PORTABLE ABDOMEN - 1 VIEW COMPARISON:  CT 03/30/2022 FINDINGS: Enteric tube tip is identified within the left upper quadrant of the abdomen in the expected location of the body of stomach. Similar appearance of mild gaseous distension of the colon. No dilated small bowel loops noted. IMPRESSION: 1. Enteric tube tip within the body of stomach. 2. Similar appearance of mild gaseous distension of the colon. Electronically Signed   By: Signa Kell M.D.   On: 06/30/2022 14:17   CT ABDOMEN WO CONTRAST  Result Date: 06/28/2022 CLINICAL DATA:  Dysphagia EXAM: CT ABDOMEN WITHOUT CONTRAST TECHNIQUE: Multidetector CT imaging of the abdomen was performed following the standard protocol without IV contrast. RADIATION DOSE REDUCTION: This exam was performed according to the departmental dose-optimization program which includes automated exposure control, adjustment of the mA and/or kV according to patient size and/or use of iterative reconstruction technique. COMPARISON:  X-ray 06/27/2022. FINDINGS: Lower chest: Breathing motion. There is a left-sided pigtail pleural catheter. Minimal left-sided pleural effusion. There is some dependent opacities along both lung bases, left-greater-than-right. Atelectasis is favored over infiltrate. Hepatobiliary: On this non IV contrast exam, the liver has some small low-attenuation lesions. Example in segment 4 is 1 of the larger foci measuring 12 mm consistent with a benign cystic lesion. Other lesions are less than a cm. No specific imaging follow-up. Gallbladder is contracted. Pancreas: Grossly of the pancreas is preserved. Poorly defined  on this examination. Spleen: The spleen  is nonenlarged. Adrenals/Urinary Tract: The adrenal glands are preserved. 3 mm nonobstructing lower pole right-sided renal stone. Punctate midportion left-sided renal stone. Stomach/Bowel: Enteric tube in place with tip extending to the pylorus. The stomach has some moderate fluid but is nondilated. Limited evaluation of the bowel with overlapping artifacts. There are a few prominent loops of small bowel left midabdomen. The visualized large bowel is nondilated Vascular/Lymphatic: Grossly normal caliber aorta and IVC. Evaluation for lymph nodes is limited by the artifacts. No obvious lymph node enlargement. Other: Mild central mesenteric stranding. Anasarca. Of note there is motion as well as artifacts from patient's arms being scanned at the patient's side. Musculoskeletal: No acute or significant osseous findings. IMPRESSION: Limited examination due to motion artifact and lack of contrast. Enteric tube in place with tip near the pylorus. There are some prominent loops of small bowel left midabdomen with some stranding but appearance is nonspecific and poorly defined. Overall with the history and these findings a contrast examination may be useful with the contrast examination. Oral and/or IV would be useful. Nonobstructing renal stones. Catheter in the left lung base. Dependent bilateral lower lobe lung opacities, left-greater-than-right. Trace left pleural fluid. Electronically Signed   By: Karen Kays M.D.   On: 06/28/2022 11:38   DG CHEST PORT 1 VIEW  Result Date: 06/28/2022 CLINICAL DATA:  [id number] Chest tube in place [id number] EXAM: PORTABLE CHEST 1 VIEW COMPARISON:  CXR 06/27/22 FINDINGS: Tracheostomy terminates approximately 7 cm above the carina. Weighted enteric tube courses below diaphragm with the tip out field of view. Left basilar pleural drainage catheter in place No radiographically discernible pneumothorax. No large pleural effusion. Unchanged cardiac and mediastinal contours. Linear opacity in  the right lung base is favored to represent atelectasis. Hazy opacity at the left lung base could represent atelectasis or infection. No radiographically apparent displaced rib fractures. Visualized upper abdomen is unremarkable. IMPRESSION: Left basilar pleural drainage catheter in place. No radiographically discernible pneumothorax or large pleural effusion. There is a hazy opacity at the left lung base could represent atelectasis or infection. Electronically Signed   By: Lorenza Cambridge M.D.   On: 06/28/2022 08:08   DG Chest Port 1 View  Result Date: 06/27/2022 CLINICAL DATA:  Hypoxia EXAM: PORTABLE CHEST 1 VIEW COMPARISON:  Previous studies including the examination done earlier today FINDINGS: Tip of tracheostomy is 7.4 cm above the carina. Enteric tube is noted traversing the esophagus. Left chest tube is noted with its tip in the medial left lower lung field. There is interval decrease in opacity in the left lower lung fields suggesting decreasing pleural effusion. There are linear densities in both lower lung fields suggesting atelectasis/pneumonia. There are no signs of alveolar pulmonary edema. There is no pneumothorax. IMPRESSION: There is decrease in amount of left pleural effusion. Linear densities are seen in medial aspects of both lower lung fields suggesting atelectasis/pneumonia. Electronically Signed   By: Ernie Avena M.D.   On: 06/27/2022 17:34   DG Chest Port 1 View  Result Date: 06/27/2022 CLINICAL DATA:  Post tracheostomy. EXAM: PORTABLE CHEST 1 VIEW COMPARISON:  06/23/2022 FINDINGS: Feeding tube is in place, tip beyond the edge of the image at least to the level of the stomach. A pigtail type catheter is identified at the LEFT lung base, stable in appearance. There has been interval placement of tracheostomy tube, tip approximately 5.3 centimeters above the carina. There continues to be dense opacification of the LEFT lung base. No  evidence for pulmonary edema. No pneumothorax.  IMPRESSION: 1. Interval placement of tracheostomy tube. 2. Persistent dense opacification of the LEFT lung base. Electronically Signed   By: Norva Pavlov M.D.   On: 06/27/2022 15:26   DG Abd Portable 1V  Result Date: 06/27/2022 CLINICAL DATA:  Evaluate feeding tube placement. EXAM: PORTABLE ABDOMEN - 1 VIEW COMPARISON:  06/24/2022 FINDINGS: Percutaneous pigtail thoracostomy tube is again noted overlying the left lower chest. Interval placement of feeding tube with tip below the level of the GE junction in the expected location of the body of stomach. Similar appearance of gaseous distension of the colon. IMPRESSION: Interval placement of feeding tube with tip below the level of the GE junction in the expected location of the body of stomach. Electronically Signed   By: Signa Kell M.D.   On: 06/27/2022 12:45   DG Abd 1 View  Result Date: 06/24/2022 CLINICAL DATA:  OG tube placement EXAM: ABDOMEN - 1 VIEW COMPARISON:  06/24/2022 FINDINGS: OG tube is in place with the tip in the fundus of the stomach. Left basilar chest tube remains in place. Left base atelectasis. IMPRESSION: OG tube tip in the fundus of the stomach. Electronically Signed   By: Charlett Nose M.D.   On: 06/24/2022 03:43   DG Abd 1 View  Result Date: 06/24/2022 CLINICAL DATA:  [id number] Encounter for imaging study to confirm orogastric (OG) tube placement [id number] EXAM: ABDOMEN - 1 VIEW COMPARISON:  X-ray abdomen 06/18/2022 FINDINGS: Enteric tube with tip and side port overlying the expected location of the gastric lumen. Left upper lobe pigtail. Gaseous distension of the large bowel. No radio-opaque calculi or other significant radiographic abnormality are seen. IMPRESSION: Enteric tube in good position. Electronically Signed   By: Tish Frederickson M.D.   On: 06/24/2022 02:06   DG CHEST PORT 1 VIEW  Result Date: 06/23/2022 CLINICAL DATA:  [id number] Pneumothorax [id number] EXAM: PORTABLE CHEST 1 VIEW COMPARISON:  Jun 21, 2022 FINDINGS: The  cardiomediastinal silhouette is unchanged in contour.LEFT-sided chest tube. LEFT neck CVC tip terminates over the SVC. ETT tip terminates 6.7 cm above the carina. Enteric tube tip and side port project over the esophagus. Favored trace LEFT pleural effusion. No significant pneumothorax. Bibasilar atelectasis. IMPRESSION: 1. Favored trace LEFT pleural effusion. No significant pneumothorax. 2. Enteric tube tip and side port project over the esophagus. Recommend advancement. Electronically Signed   By: Meda Klinefelter M.D.   On: 06/23/2022 14:00   DG Chest Port 1 View  Result Date: 06/21/2022 CLINICAL DATA:  Left pneumothorax EXAM: PORTABLE CHEST 1 VIEW COMPARISON:  Previous studies including the examination done earlier today FINDINGS: There is interval decrease in size of small left apical pneumothorax. There is tiny residual pneumothorax in the medial left apex in the current study. Cardiac size is within normal limits. There are no signs of pulmonary edema or new focal infiltrates. Costophrenic angles are clear. Tip of left chest tube is seen in the medial left lower lung field. Tip of NG tube is seen in the fundus of the stomach. Tip of left IJ central venous catheter is seen in superior vena cava. Tip of endotracheal tube is 5.5 cm above the carina. IMPRESSION: There is interval decrease in size of left pneumothorax with tiny residual pneumothorax in the medial left apex. There are no new infiltrates or signs of pulmonary edema. Electronically Signed   By: Ernie Avena M.D.   On: 06/21/2022 13:24   DG CHEST PORT 1 VIEW  Result Date:  06/21/2022 CLINICAL DATA:  Pneumothorax EXAM: PORTABLE CHEST 1 VIEW COMPARISON:  CXR 06/20/22 FINDINGS: Left-sided pleural pigtail drainage catheter in place with unchanged positioning. There is a small left apical pneumothorax, likely decreased in size compared prior exam. Left-sided central venous catheter with unchanged positioning. Enteric tube courses below  diaphragm with the tip out of the field of view. Endotracheal tube terminates approximately 5 cm above the carina. No pleural effusion. No focal airspace opacity. Unchanged cardiac and mediastinal contours. No radiographically apparent new displaced rib fractures. Visualized upper abdomen is unremarkable. IMPRESSION: Small left apical pneumothorax, likely decreased in size compared to prior exam. Thoracostomy tube in place. Electronically Signed   By: Lorenza Cambridge M.D.   On: 06/21/2022 10:01   DG Chest Port 1 View  Result Date: 06/20/2022 CLINICAL DATA:  Chest tube placement. EXAM: PORTABLE CHEST 1 VIEW COMPARISON:  One-view chest x-ray 06/20/2022 at 12:25 p.m. FINDINGS: The heart is enlarged. Endotracheal tube is stable. Left IJ catheter is stable. Gastric tube terminates in the fundus the stomach. A left sided pleural pigtail catheter was placed. The pneumothorax is reduced but not eliminated. IMPRESSION: 1. Interval placement of left-sided pleural pigtail catheter with reduction in left-sided pneumothorax. 2. Stable cardiomegaly without failure. Electronically Signed   By: Marin Roberts M.D.   On: 06/20/2022 17:50   DG CHEST PORT 1 VIEW  Addendum Date: 06/20/2022   ADDENDUM REPORT: 06/20/2022 16:05 ADDENDUM: Critical Value/emergent results were called by telephone at the time of interpretation on 06/20/2022 at 4:03 pm to provider Priscella Mann, RN, who verbally acknowledged these results. Electronically Signed   By: Signa Kell M.D.   On: 06/20/2022 16:05   Result Date: 06/20/2022 CLINICAL DATA:  Hypoxia. EXAM: PORTABLE CHEST 1 VIEW COMPARISON:  06/18/2018 for FINDINGS: ETT tip is stable above the carina. There is an enteric tube with tip coursing below the field of view. Left IJ catheter tip is in the distal SVC. There is a new moderate left-sided pneumothorax overlying the apex and lateral basal left lung. No signs of pleural effusion or edema. Persistent opacities within the right middle  lobe. IMPRESSION: 1. New moderate left-sided pneumothorax. 2. Stable support apparatus. 3. Persistent right middle lobe opacities. Electronically Signed: By: Signa Kell M.D. On: 06/20/2022 15:41   CT HEAD WO CONTRAST ( )  Result Date: 06/20/2022 CLINICAL DATA:  Neuro deficit with acute stroke suspected EXAM: CT HEAD WITHOUT CONTRAST TECHNIQUE: Contiguous axial images were obtained from the base of the skull through the vertex without intravenous contrast. RADIATION DOSE REDUCTION: This exam was performed according to the departmental dose-optimization program which includes automated exposure control, adjustment of the mA and/or kV according to patient size and/or use of iterative reconstruction technique. COMPARISON:  Yesterday FINDINGS: Brain: Large ICH centered at the right basal ganglia and adjacent white matter, with tracking towards the right brainstem is unchanged in shape and size, measuring up to 5 cm anterior to posterior and 4 cm craniocaudal. Regional edema is unchanged. Extensive cortical infarct in the right posterior frontal and parietal lobe. Smaller but more apparent left parietal cortex infarcts since brain MRI yesterday. Midline shift is 5 mm. Vascular: No hyperdense vessel or unexpected calcification. Skull: Normal. Negative for fracture or focal lesion. Sinuses/Orbits: No acute finding. IMPRESSION: 1. Multiple infarcts by brain MRI yesterday, suspect progressive cortex infarction in the left parietal lobe. 2. Unchanged appearance of right ICH with adjacent edema. Midline shift is 5 mm. Electronically Signed   By: Audry Riles.D.  On: 06/20/2022 06:09   CT HEAD WO CONTRAST ( )  Result Date: 06/19/2022 CLINICAL DATA:  Follow-up ICH EXAM: CT HEAD WITHOUT CONTRAST TECHNIQUE: Contiguous axial images were obtained from the base of the skull through the vertex without intravenous contrast. RADIATION DOSE REDUCTION: This exam was performed according to the departmental  dose-optimization program which includes automated exposure control, adjustment of the mA and/or kV according to patient size and/or use of iterative reconstruction technique. COMPARISON:  MRI from earlier today. FINDINGS: Brain: Large acute hemorrhage centered at the right basal ganglia and adjacent white matter without progression in size or shape when compared to prior. Cytotoxic edema most apparent in the right frontal parietal cortex, infarcts being under appreciated when compared to preceding brain MRI. Diffuse effacement of subarachnoid spaces, leftward midline shift of 5 mm. Unchanged mild dilatation of the left lateral ventricle. Vascular: No hyperdense vessel or unexpected calcification. Skull: Normal. Negative for fracture or focal lesion. Sinuses/Orbits: No acute finding. IMPRESSION: Right ICH and extensive cortical infarction that is stable from brain MRI earlier today. No new abnormality. Leftward midline shift of 5 mm. Electronically Signed   By: Tiburcio Pea M.D.   On: 06/19/2022 11:13   MR BRAIN W WO CONTRAST  Result Date: 06/19/2022 CLINICAL DATA:  Hemorrhagic stroke workup EXAM: MRI HEAD WITHOUT AND WITH CONTRAST TECHNIQUE: Multiplanar, multiecho pulse sequences of the brain and surrounding structures were obtained without and with intravenous contrast. CONTRAST:  6mL GADAVIST GADOBUTROL 1 MMOL/ML IV SOLN COMPARISON:  Head CT and CTA from 2 days prior FINDINGS: Brain: Multiple areas of restricted diffusion most confluent in the right parietal and posterior temporal cortex. Restricted diffusion is seen surrounding the large and known right basal ganglia hemorrhage which measures up to 5.3 cm anterior to posterior with cleft of fluid surrounding the hematoma. Infarcts in other arterial distributions are also present, including along the left cerebral convexity (especially parietooccipital)and at the right brainstem. Midline shift measures 5 mm. There is periventricular FLAIR hyperintensity  suggesting chronic small vessel disease. There is asymmetric mild dilatation of the left temporal horn. Vascular: Filling defect in the right sigmoid sinus and upper internal jugular vein. Skull and upper cervical spine: Normal marrow signal Sinuses/Orbits: Mild mucosal thickening in the mastoid air cells. Retention cysts in the right maxillary sinus. Diffuse mucosal thickening in the paranasal sinuses. Negative orbits. IMPRESSION: 1. 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 The most confluent area is in the right MCA territory with infarct likely underlying the right basal ganglia hematoma. 2. 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 The pattern of infarcts and hemorrhage do not correlate with the clot location however. 3. Mild dilatation of the temporal horn of the left lateral ventricle. 5 mm of midline shift. Electronically Signed   By: Tiburcio Pea M.D.   On: 06/19/2022 06:59   DG Abd 1 View  Result Date: 06/18/2022 CLINICAL DATA:  Check gastric catheter placement EXAM: ABDOMEN - 1 VIEW COMPARISON:  Film from earlier in the same day. FINDINGS: Gastric catheter has been advanced and now lies completely within the stomach. Scattered large and small bowel gas is noted. IMPRESSION: Gastric catheter within the stomach as described. Electronically Signed   By: Alcide Clever M.D.   On: 06/18/2022 23:47   DG Abd 1 View  Result Date: 06/18/2022 CLINICAL DATA:  Feeding tube placement. EXAM: ABDOMEN - 1 VIEW COMPARISON:  Jun 18, 2022 (6:44 p.m.) FINDINGS: A  nasogastric tube is seen with its distal tip overlying the expected region of the body of the stomach. The distal side hole sits at the level of the gastroesophageal junction. The bowel gas pattern is normal. No radio-opaque calculi or other significant radiographic abnormality are seen. IMPRESSION: Nasogastric tube positioning, as described above.  Further advancement of the NG tube by approximately 7 cm is recommended to decrease the risk of aspiration. Electronically Signed   By: Aram Candela M.D.   On: 06/18/2022 22:23   DG Abd Portable 1V  Result Date: 06/18/2022 CLINICAL DATA:  Check gastric catheter placement EXAM: PORTABLE ABDOMEN - 1 VIEW COMPARISON:  None Available. FINDINGS: Scattered large and small bowel gas is noted. Gastric catheter is noted with the tip in the stomach. Proximal side port lies in the distal esophagus. This should be advanced several cm deeper into the stomach. IMPRESSION: Gastric catheter as described. This should be advanced deeper into the stomach. Electronically Signed   By: Alcide Clever M.D.   On: 06/18/2022 21:24   DG CHEST PORT 1 VIEW  Result Date: 06/18/2022 CLINICAL DATA:  Evaluate central line placement EXAM: PORTABLE CHEST 1 VIEW COMPARISON:  Jun 18, 2022 FINDINGS: The side port of the NG tube is near the GE junction. The distal tip is in the stomach. The ETT is in good position. A left central line is been placed in the interval terminating in the central SVC. No pneumothorax. The left lung is clear. Right middle lobe infiltrate remains. No other interval changes. IMPRESSION: 1. Support apparatus as above. The side port of the NG tube is near the GE junction. Recommend advancing the tube. 2. The left lung is clear. Right middle lobe infiltrate remains. 3. No other interval changes. Electronically Signed   By: Gerome Sam III M.D.   On: 06/18/2022 17:20   Overnight EEG with video  Result Date: 06/18/2022 Charlsie Quest, MD     06/19/2022  9:37 AM Patient Name: NEASIA GUIDROZ MRN: [id number] Epilepsy Attending: Charlsie Quest Referring Physician/Provider: Erick Blinks, MD Duration: 06/24/2022 1215 to 06/18/2022 1215 Patient history: 47 y.o. female with hx of cocaine use p/w unresponsive and large R IPH with brain compression and 5mm midline shift. EEG to evaluate for seizure Level of  alertness: Awake, asleep AEDs during EEG study: None Technical aspects: This EEG study was done with scalp electrodes positioned according to the 10-20 International system of electrode placement. Electrical activity was reviewed with band pass filter of 1-70Hz , sensitivity of 7 uV/mm, display speed of 62mm/sec with a 60Hz  notched filter applied as appropriate. EEG data were recorded continuously and digitally stored.  Video monitoring was available and reviewed as appropriate. Description: The posterior dominant rhythm consists of 7.5 Hz activity of moderate voltage (25-35 uV) seen predominantly in posterior head regions, symmetric and reactive to eye opening and eye closing. Sleep was characterized by vertex waves, sleep spindles (12 to 14 Hz), maximal frontocentral region. EEG showed continuous 3 to 6 Hz theta-delta slowing in right hemisphere, maximal right temporal region. Intermittent generalized 3-6hz  theta-delta slowing was also noted. Hyperventilation and photic stimulation were not performed.   ABNORMALITY - Continuous slow, right hemisphere, maximal right temporal region - Intermittent slow, generalized IMPRESSION: This study is suggestive of cortical dysfunction arising from right hemisphere, maximal right temporal region likely secondary to underlying structural abnormality. Additionally there is mild to moderate diffuse encephalopathy. No seizures or epileptiform discharges were seen throughout the recording. Priyanka Annabelle Harman   DG  Chest Port 1 View  Result Date: 06/18/2022 CLINICAL DATA:  Pulmonary infiltrates EXAM: PORTABLE CHEST 1 VIEW COMPARISON:  07/07/2022 FINDINGS: Endotracheal tube is in stable position. NG tube tip in the proximal stomach with the side port near the GE junction. The NG tube appears to be buckled/looped within the neck near the thoracic inlet. Heart mediastinal contours within normal limits. Right middle lobe airspace opacity again noted, stable. No confluent opacity on the  left. No effusions or acute bony abnormality. IMPRESSION: Continued right middle lobe infiltrate, unchanged. NG tube appears to be buckled slashed looped in the upper esophagus near the thoracic inlet. Tip is in the proximal stomach. Electronically Signed   By: Charlett Nose M.D.   On: 06/18/2022 07:11   DG Abd 1 View  Result Date: 06/30/2022 CLINICAL DATA:  Nasogastric tube placement. EXAM: ABDOMEN - 1 VIEW COMPARISON:  Radiograph earlier today. FINDINGS: Tip of the enteric tube is below the diaphragm in the stomach, the side port is just at the gastroesophageal junction. Normal upper abdominal bowel gas pattern. IMPRESSION: Tip of the enteric tube below the diaphragm in the stomach, side-port just at the gastroesophageal junction. Electronically Signed   By: Narda Rutherford M.D.   On: 06/25/2022 15:43   ECHOCARDIOGRAM COMPLETE  Result Date: 06/13/2022    ECHOCARDIOGRAM REPORT   Patient Name:   JAMAIYA TUOMI Date of Exam: 06/19/2022 Medical Rec #:  [id number]          Height:       65.0 in Accession #:    [id number]         Weight:       140.0 lb Date of Birth:  1975-02-23          BSA:          1.700 m Patient Age:    46 years           BP:           122/111 mmHg Patient Gender: F                  HR:           102 bpm. Exam Location:  Inpatient Procedure: 2D Echo, Cardiac Doppler and Color Doppler Indications:    Stroke  History:        Patient has no prior history of Echocardiogram examinations.                 ICH, sepsis; Risk Factors:Current Smoker and Substance abuse.  Sonographer:    Wallie Char Referring Phys: [id number] [facility]  Sonographer Comments: Technically challenging study due to limited acoustic windows and echo performed with patient supine and on artificial respirator. IMPRESSIONS  1. Left ventricular ejection fraction, by estimation, is 50 to 55%. The left ventricle has low normal function. The left ventricle has no regional wall motion abnormalities. There is moderate left  ventricular hypertrophy. Left ventricular diastolic parameters are consistent with Grade I diastolic dysfunction (impaired relaxation).  2. Right ventricular systolic function is normal. The right ventricular size is normal. There is mildly elevated pulmonary artery systolic pressure.  3. The mitral valve is normal in structure. Trivial mitral valve regurgitation. No evidence of mitral stenosis.  4. The aortic valve is tricuspid. Aortic valve regurgitation is not visualized. No aortic stenosis is present. FINDINGS  Left Ventricle: Left ventricular ejection fraction, by estimation, is 50 to 55%. The left ventricle has low normal function. The left ventricle has no regional wall  motion abnormalities. The left ventricular internal cavity size was normal in size. There is moderate left ventricular hypertrophy. Left ventricular diastolic parameters are consistent with Grade I diastolic dysfunction (impaired relaxation). Right Ventricle: The right ventricular size is normal. No increase in right ventricular wall thickness. Right ventricular systolic function is normal. There is mildly elevated pulmonary artery systolic pressure. The tricuspid regurgitant velocity is 2.74  m/s, and with an assumed right atrial pressure of 8 mmHg, the estimated right ventricular systolic pressure is 38.0 mmHg. Left Atrium: Left atrial size was normal in size. Right Atrium: Right atrial size was normal in size. Pericardium: There is no evidence of pericardial effusion. Mitral Valve: The mitral valve is normal in structure. Trivial mitral valve regurgitation. No evidence of mitral valve stenosis. MV peak gradient, 5.3 mmHg. The mean mitral valve gradient is 2.0 mmHg. Tricuspid Valve: The tricuspid valve is normal in structure. Tricuspid valve regurgitation is trivial. Aortic Valve: The aortic valve is tricuspid. Aortic valve regurgitation is not visualized. No aortic stenosis is present. Aortic valve mean gradient measures 4.0 mmHg. Aortic  valve peak gradient measures 6.2 mmHg. Aortic valve area, by VTI measures 2.39 cm. Pulmonic Valve: The pulmonic valve was normal in structure. Pulmonic valve regurgitation is not visualized. Aorta: The aortic root and ascending aorta are structurally normal, with no evidence of dilitation. IAS/Shunts: The interatrial septum was not well visualized.  LEFT VENTRICLE PLAX 2D LVIDd:         4.50 cm     Diastology LVIDs:         3.30 cm     LV e' medial:    6.63 cm/s LV PW:         1.10 cm     LV E/e' medial:  11.0 LV IVS:        1.00 cm     LV e' lateral:   5.86 cm/s LVOT diam:     1.90 cm     LV E/e' lateral: 12.4 LV SV:         40 LV SV Index:   23 LVOT Area:     2.84 cm  LV Volumes (MOD) LV vol d, MOD A2C: 68.4 ml LV vol d, MOD A4C: 77.5 ml LV vol s, MOD A2C: 33.9 ml LV vol s, MOD A4C: 37.8 ml LV SV MOD A2C:     34.5 ml LV SV MOD A4C:     77.5 ml LV SV MOD BP:      39.2 ml RIGHT VENTRICLE             IVC RV S prime:     13.90 cm/s  IVC diam: 1.50 cm TAPSE (M-mode): 1.6 cm LEFT ATRIUM             Index        RIGHT ATRIUM           Index LA diam:        3.00 cm 1.76 cm/m   RA Area:     10.40 cm LA Vol (A2C):   23.2 ml 13.65 ml/m  RA Volume:   20.40 ml  12.00 ml/m LA Vol (A4C):   19.2 ml 11.29 ml/m LA Biplane Vol: 21.2 ml 12.47 ml/m  AORTIC VALVE AV Area (Vmax):    2.85 cm AV Area (Vmean):   2.41 cm AV Area (VTI):     2.39 cm AV Vmax:           124.00 cm/s AV Vmean:  93.450 cm/s AV VTI:            0.166 m AV Peak Grad:      6.2 mmHg AV Mean Grad:      4.0 mmHg LVOT Vmax:         124.50 cm/s LVOT Vmean:        79.500 cm/s LVOT VTI:          0.140 m LVOT/AV VTI ratio: 0.84  AORTA Ao Root diam: 3.30 cm Ao Asc diam:  2.80 cm MITRAL VALVE               TRICUSPID VALVE MV Area (PHT): 4.06 cm    TR Peak grad:   30.0 mmHg MV Area VTI:   1.72 cm    TR Vmax:        274.00 cm/s MV Peak grad:  5.3 mmHg MV Mean grad:  2.0 mmHg    SHUNTS MV Vmax:       1.15 m/s    Systemic VTI:  0.14 m MV Vmean:      68.2 cm/s    Systemic Diam: 1.90 cm MV Decel Time: 187 msec MV E velocity: 72.90 cm/s MV A velocity: 89.60 cm/s MV E/A ratio:  0.81 Epifanio Lesches MD Electronically signed by Epifanio Lesches MD Signature Date/Time: 06/30/2022/10:47:05 AM    Final    CT VENOGRAM HEAD  Result Date: 06/14/2022 CLINICAL DATA:  Intracranial hemorrhage. EXAM: CT VENOGRAM HEAD TECHNIQUE: Venographic phase images of the brain were obtained following the administration of intravenous contrast. Multiplanar reformats and maximum intensity projections were generated. RADIATION DOSE REDUCTION: This exam was performed according to the departmental dose-optimization program which includes automated exposure control, adjustment of the mA and/or kV according to patient size and/or use of iterative reconstruction technique. CONTRAST:  75mL OMNIPAQUE IOHEXOL 350 MG/ML SOLN COMPARISON:  None Available. FINDINGS: Filling defect of the right transverse sinus and right transverse sigmoid junction which are somewhat large and tubular for arachnoid granulations, although an arachnoid granulation at the left transverse sigmoid junction appears nearly similar. The location would also not explain the acute hemorrhage. No visible cortical or central venous thrombosis. IMPRESSION: No cortical or central venous thrombosis to explain the ICH. There are filling defects at the right transverse and transverse sigmoid dural sinuses which are prominent for arachnoid granulations but not definitive for thrombus. Attention at follow-up imaging. Electronically Signed   By: Tiburcio Pea M.D.   On: 06/26/2022 05:30   CT ANGIO HEAD NECK W WO CM  Result Date: 06/29/2022 CLINICAL DATA:  Found unresponsive. Intracranial hemorrhage. History of cocaine use EXAM: CT ANGIOGRAPHY HEAD AND NECK WITH AND WITHOUT CONTRAST TECHNIQUE: Multidetector CT imaging of the head and neck was performed using the standard protocol during bolus administration of intravenous contrast.  Multiplanar CT image reconstructions and MIPs were obtained to evaluate the vascular anatomy. Carotid stenosis measurements (when applicable) are obtained utilizing NASCET criteria, using the distal internal carotid diameter as the denominator. RADIATION DOSE REDUCTION: This exam was performed according to the departmental dose-optimization program which includes automated exposure control, adjustment of the mA and/or kV according to patient size and/or use of iterative reconstruction technique. CONTRAST:  75mL OMNIPAQUE IOHEXOL 350 MG/ML SOLN COMPARISON:  Head CT from earlier today FINDINGS: CTA NECK FINDINGS Aortic arch: Unremarkable Right carotid system: Mixed density plaque at the bifurcation with some low-density plaque bulging into the ICA bulb. No ulceration or flow limiting stenosis Left carotid system: Mainly low-density plaque at the bifurcation  without stenosis or ulceration. Vertebral arteries: No proximal subclavian stenosis. The vertebral arteries are somewhat tortuous but smoothly contoured and diffusely patent Skeleton: No acute or aggressive finding. Other neck: No acute finding Upper chest: Mild opacification of dependent lungs which could be atelectasis. Centrilobular emphysema. Review of the MIP images confirms the above findings CTA HEAD FINDINGS Anterior circulation: No aneurysm or spot sign seen underlying the ICH. There is a aneurysm projecting leftward from the left carotid terminus which measures 4 mm. No adjacent hemorrhage. No major branch occlusion. There is undulation of the bilateral intracranial branches which is likely atheromatous given findings in the neck. No segmental beading. Some less intense flow and right MCA branches is likely related to mass effect by the ICH. Posterior circulation: The vertebral and basilar arteries arediffusely patent. Undulation of the left more than right PCA with up to moderate narrowing on the left, likely atheromatous given the findings in the neck.  Venous sinuses: Reference dedicated CT venogram Anatomic variants: No acute finding Review of the MIP images confirms the above findings IMPRESSION: 1. No vascular lesion or spot sign seen at the acute ICH. 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 3. 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 Electronically Signed   By: Tiburcio Pea M.D.   On: 06/22/2022 05:18   CT Head Wo Contrast  Result Date: 06/25/2022 CLINICAL DATA:  Altered mental status EXAM: CT HEAD WITHOUT CONTRAST TECHNIQUE: Contiguous axial images were obtained from the base of the skull through the vertex without intravenous contrast. RADIATION DOSE REDUCTION: This exam was performed according to the departmental dose-optimization program which includes automated exposure control, adjustment of the mA and/or kV according to patient size and/or use of iterative reconstruction technique. COMPARISON:  None Available. FINDINGS: Brain: There is a large area of parenchymal hemorrhage identified in the right cerebral hemisphere centered primarily within the basal ganglia and extending superiorly into the centrum semi ovale. This measures approximately 4.7 x 4.0 cm in greatest AP and transverse dimensions respectively. It extends for approximately 4.7 cm in craniocaudad dimension. Significant mass-effect upon the right lateral ventricle is noted. Mild midline shift of approximately 5 mm is noted. Some surrounding edema is seen as well. No other hemorrhage is noted. Relative loss of the sulcal markings is noted likely related to some increased intracranial pressure. Vascular: No hyperdense vessel or unexpected calcification. Skull: Normal. Negative for fracture or focal lesion. Sinuses/Orbits: No acute finding. Other: None. IMPRESSION: Right-sided intraparenchymal hemorrhage as described with associated edema and mild midline shift of 5 mm from right to left. Critical Value/emergent results were called by telephone at the time  of interpretation on 06/20/2022 at 2:27 am to Dr. Donna Bernard , who verbally acknowledged these results. Electronically Signed   By: Alcide Clever M.D.   On: 06/28/2022 02:29   DG Abdomen 1 View  Result Date: 07/07/2022 CLINICAL DATA:  Check gastric catheter placement EXAM: ABDOMEN - 1 VIEW COMPARISON:  None Available. FINDINGS: Gastric catheter is noted extending into the stomach. Proximal side port lies at the gastroesophageal junction. This should be advanced further into the stomach. No free air is seen. No bony abnormality is noted. IMPRESSION: Gastric catheter as described. Electronically Signed   By: Alcide Clever M.D.   On: 06/20/2022 02:16   DG Chest Port 1 View  Result Date: 06/19/2022 CLINICAL DATA:  Check endotracheal tube placement EXAM: PORTABLE CHEST 1 VIEW COMPARISON:  10/21/2020 FINDINGS: Cardiac shadow is within normal limits. Endotracheal tube is noted 1.5  cm above the carina. Gastric catheter is noted extending into the stomach. Patchy increased density is noted in the right middle lobe beneath the minor fissure consistent with acute infiltrate. No sizable effusion is noted. No bony abnormality is seen. IMPRESSION: Right middle lobe infiltrate. Tubes and lines as described. Electronically Signed   By: Alcide Clever M.D.   On: 06/18/2022 02:16    Microbiology: Recent Results (from the past 240 hour(s))  Culture, blood (Routine X 2) w Reflex to ID Panel     Status: None   Collection Time: 06/24/22  9:49 PM   Specimen: BLOOD LEFT HAND  Result Value Ref Range Status   Specimen Description BLOOD LEFT HAND  Final   Special Requests   Final    BOTTLES DRAWN AEROBIC AND ANAEROBIC Blood Culture results may not be optimal due to an excessive volume of blood received in culture bottles   Culture   Final    NO GROWTH 5 DAYS Performed at [facility], [address], [place], [place] [address]    Report Status 06/29/2022 FINAL  Final  Culture, blood (Routine X 2) w Reflex to ID  Panel     Status: None   Collection Time: 06/24/22  9:52 PM   Specimen: BLOOD LEFT ARM  Result Value Ref Range Status   Specimen Description BLOOD LEFT ARM  Final   Special Requests   Final    BOTTLES DRAWN AEROBIC AND ANAEROBIC Blood Culture adequate volume   Culture   Final    NO GROWTH 5 DAYS Performed at [facility], [address] [place], [place] [address]    Report Status 06/29/2022 FINAL  Final     Signed: Osvaldo Shipper, MD

## 2022-07-09 NOTE — Progress Notes (Signed)
Waisted 55mg  Morphine in Stericycle.  Witnessed by [place], Charity fundraiser.

## 2022-07-09 NOTE — Progress Notes (Signed)
STROKE TEAM PROGRESS NOTE   SUBJECTIVE (INTERVAL HISTORY) Patient neurological exam remains quite poor with unreactive unequal pupils.  She is still on ventilatory support and is not weaning.   She is quite unresponsive to stimulation on bedside exam.    There is no family at the bedside.   Family has agreed to DNR and considering withdrawal of ventilatory support and comfort care later today. OBJECTIVE Temp:  [98 F (36.7 C)-100.5 F (38.1 C)] 98 F (36.7 C) (05/24 0713) Pulse Rate:  [51-74] 53 (05/24 0930) Cardiac Rhythm: Normal sinus rhythm (05/24 0801) Resp:  [14-30] 20 (05/24 0930) BP: (108-149)/(61-92) 139/81 (05/24 1103) SpO2:  [93 %-98 %] 96 % (05/24 0930) FiO2 (%):  [40 %] 40 % (05/24 1052) Weight:  [67.1 kg] 67.1 kg (05/24 0500)  Recent Labs  Lab 06/25/22 0750 06/25/22 1151 06/25/22 1530 06/26/22 1952 06/28/22 0737  GLUCAP 97 87 102* 112* 117*   Recent Labs  Lab 06/26/22 0955 06/27/22 0355 06/28/22 1003 06/30/22 0010 07/06/2022 0109  NA 154* 151* 149* 145 143  K 3.5 3.3* 3.5 3.5 4.1  CL 120* 118* 116* 111 110  CO2 26 26 22 23 24   GLUCOSE 120* 104* 125* 118* 116*  BUN 33* 30* 26* 24* 26*  CREATININE 1.00 0.86 0.80 0.72 0.60  CALCIUM 8.6* 8.4* 8.6* 8.8* 8.9  MG  --  2.5*  --  2.1 2.3  PHOS  --  3.7  --  4.4 4.1   Recent Labs  Lab 06/30/22 0010 07/05/2022 0109  ALBUMIN 1.9* 1.8*    Recent Labs  Lab 06/25/22 0349 06/27/22 0355 06/28/22 1003 06/30/22 0010  WBC 20.7* 12.4* 11.5* 15.1*  HGB 7.6* 7.3* 8.2* 8.2*  HCT 26.3* 25.2* 28.5* 28.2*  MCV 75.8* 76.1* 76.0* 75.2*  PLT 354 380 433* 547*   No results for input(s): "CKTOTAL", "CKMB", "CKMBINDEX", "TROPONINI" in the last 168 hours.  No results for input(s): "LABPROT", "INR" in the last 72 hours. No results for input(s): "COLORURINE", "LABSPEC", "PHURINE", "GLUCOSEU", "HGBUR", "BILIRUBINUR", "KETONESUR", "PROTEINUR", "UROBILINOGEN", "NITRITE", "LEUKOCYTESUR" in the last 72 hours.  Invalid input(s):  "APPERANCEUR"     Component Value Date/Time   CHOL 126 06/18/2022 0242   TRIG 123 06/21/2022 0407   HDL 45 06/18/2022 0242   CHOLHDL 2.8 06/18/2022 0242   VLDL 16 06/18/2022 0242   LDLCALC 65 06/18/2022 0242   Lab Results  Component Value Date   HGBA1C 5.9 (H) 06/18/2022      Component Value Date/Time   LABOPIA NONE DETECTED 06/24/2022 0206   COCAINSCRNUR POSITIVE (A) 06/08/2022 0206   LABBENZ NONE DETECTED 06/24/2022 0206   AMPHETMU NONE DETECTED 06/11/2022 0206   THCU NONE DETECTED 06/16/2022 0206   LABBARB NONE DETECTED 06/28/2022 0206    No results for input(s): "ETH" in the last 168 hours.   DG Abd Portable 1V  Result Date: 06/30/2022 CLINICAL DATA:  Abdominal distension. EXAM: PORTABLE ABDOMEN - 1 VIEW COMPARISON:  CT 03/30/2022 FINDINGS: Enteric tube tip is identified within the left upper quadrant of the abdomen in the expected location of the body of stomach. Similar appearance of mild gaseous distension of the colon. No dilated small bowel loops noted. IMPRESSION: 1. Enteric tube tip within the body of stomach. 2. Similar appearance of mild gaseous distension of the colon. Electronically Signed   By: Signa Kell M.D.   On: 06/30/2022 14:17   CT ABDOMEN WO CONTRAST  Result Date: 06/28/2022 CLINICAL DATA:  Dysphagia EXAM: CT ABDOMEN WITHOUT CONTRAST TECHNIQUE:  Multidetector CT imaging of the abdomen was performed following the standard protocol without IV contrast. RADIATION DOSE REDUCTION: This exam was performed according to the departmental dose-optimization program which includes automated exposure control, adjustment of the mA and/or kV according to patient size and/or use of iterative reconstruction technique. COMPARISON:  X-ray 06/27/2022. FINDINGS: Lower chest: Breathing motion. There is a left-sided pigtail pleural catheter. Minimal left-sided pleural effusion. There is some dependent opacities along both lung bases, left-greater-than-right. Atelectasis is favored  over infiltrate. Hepatobiliary: On this non IV contrast exam, the liver has some small low-attenuation lesions. Example in segment 4 is 1 of the larger foci measuring 12 mm consistent with a benign cystic lesion. Other lesions are less than a cm. No specific imaging follow-up. Gallbladder is contracted. Pancreas: Grossly of the pancreas is preserved. Poorly defined on this examination. Spleen: The spleen is nonenlarged. Adrenals/Urinary Tract: The adrenal glands are preserved. 3 mm nonobstructing lower pole right-sided renal stone. Punctate midportion left-sided renal stone. Stomach/Bowel: Enteric tube in place with tip extending to the pylorus. The stomach has some moderate fluid but is nondilated. Limited evaluation of the bowel with overlapping artifacts. There are a few prominent loops of small bowel left midabdomen. The visualized large bowel is nondilated Vascular/Lymphatic: Grossly normal caliber aorta and IVC. Evaluation for lymph nodes is limited by the artifacts. No obvious lymph node enlargement. Other: Mild central mesenteric stranding. Anasarca. Of note there is motion as well as artifacts from patient's arms being scanned at the patient's side. Musculoskeletal: No acute or significant osseous findings. IMPRESSION: Limited examination due to motion artifact and lack of contrast. Enteric tube in place with tip near the pylorus. There are some prominent loops of small bowel left midabdomen with some stranding but appearance is nonspecific and poorly defined. Overall with the history and these findings a contrast examination may be useful with the contrast examination. Oral and/or IV would be useful. Nonobstructing renal stones. Catheter in the left lung base. Dependent bilateral lower lobe lung opacities, left-greater-than-right. Trace left pleural fluid. Electronically Signed   By: Karen Kays M.D.   On: 06/28/2022 11:38   DG CHEST PORT 1 VIEW  Result Date: 06/28/2022 CLINICAL DATA:  [id number] Chest  tube in place [id number] EXAM: PORTABLE CHEST 1 VIEW COMPARISON:  CXR 06/27/22 FINDINGS: Tracheostomy terminates approximately 7 cm above the carina. Weighted enteric tube courses below diaphragm with the tip out field of view. Left basilar pleural drainage catheter in place No radiographically discernible pneumothorax. No large pleural effusion. Unchanged cardiac and mediastinal contours. Linear opacity in the right lung base is favored to represent atelectasis. Hazy opacity at the left lung base could represent atelectasis or infection. No radiographically apparent displaced rib fractures. Visualized upper abdomen is unremarkable. IMPRESSION: Left basilar pleural drainage catheter in place. No radiographically discernible pneumothorax or large pleural effusion. There is a hazy opacity at the left lung base could represent atelectasis or infection. Electronically Signed   By: Lorenza Cambridge M.D.   On: 06/28/2022 08:08   DG Chest Port 1 View  Result Date: 06/27/2022 CLINICAL DATA:  Hypoxia EXAM: PORTABLE CHEST 1 VIEW COMPARISON:  Previous studies including the examination done earlier today FINDINGS: Tip of tracheostomy is 7.4 cm above the carina. Enteric tube is noted traversing the esophagus. Left chest tube is noted with its tip in the medial left lower lung field. There is interval decrease in opacity in the left lower lung fields suggesting decreasing pleural effusion. There are linear densities in both lower  lung fields suggesting atelectasis/pneumonia. There are no signs of alveolar pulmonary edema. There is no pneumothorax. IMPRESSION: There is decrease in amount of left pleural effusion. Linear densities are seen in medial aspects of both lower lung fields suggesting atelectasis/pneumonia. Electronically Signed   By: Ernie Avena M.D.   On: 06/27/2022 17:34   DG Chest Port 1 View  Result Date: 06/27/2022 CLINICAL DATA:  Post tracheostomy. EXAM: PORTABLE CHEST 1 VIEW COMPARISON:  06/23/2022  FINDINGS: Feeding tube is in place, tip beyond the edge of the image at least to the level of the stomach. A pigtail type catheter is identified at the LEFT lung base, stable in appearance. There has been interval placement of tracheostomy tube, tip approximately 5.3 centimeters above the carina. There continues to be dense opacification of the LEFT lung base. No evidence for pulmonary edema. No pneumothorax. IMPRESSION: 1. Interval placement of tracheostomy tube. 2. Persistent dense opacification of the LEFT lung base. Electronically Signed   By: Norva Pavlov M.D.   On: 06/27/2022 15:26   DG Abd Portable 1V  Result Date: 06/27/2022 CLINICAL DATA:  Evaluate feeding tube placement. EXAM: PORTABLE ABDOMEN - 1 VIEW COMPARISON:  06/24/2022 FINDINGS: Percutaneous pigtail thoracostomy tube is again noted overlying the left lower chest. Interval placement of feeding tube with tip below the level of the GE junction in the expected location of the body of stomach. Similar appearance of gaseous distension of the colon. IMPRESSION: Interval placement of feeding tube with tip below the level of the GE junction in the expected location of the body of stomach. Electronically Signed   By: Signa Kell M.D.   On: 06/27/2022 12:45   DG Abd 1 View  Result Date: 06/24/2022 CLINICAL DATA:  OG tube placement EXAM: ABDOMEN - 1 VIEW COMPARISON:  06/24/2022 FINDINGS: OG tube is in place with the tip in the fundus of the stomach. Left basilar chest tube remains in place. Left base atelectasis. IMPRESSION: OG tube tip in the fundus of the stomach. Electronically Signed   By: Charlett Nose M.D.   On: 06/24/2022 03:43   DG Abd 1 View  Result Date: 06/24/2022 CLINICAL DATA:  [id number] Encounter for imaging study to confirm orogastric (OG) tube placement [id number] EXAM: ABDOMEN - 1 VIEW COMPARISON:  X-ray abdomen 06/18/2022 FINDINGS: Enteric tube with tip and side port overlying the expected location of the gastric lumen. Left upper  lobe pigtail. Gaseous distension of the large bowel. No radio-opaque calculi or other significant radiographic abnormality are seen. IMPRESSION: Enteric tube in good position. Electronically Signed   By: Tish Frederickson M.D.   On: 06/24/2022 02:06   DG CHEST PORT 1 VIEW  Result Date: 06/23/2022 CLINICAL DATA:  [id number] Pneumothorax [id number] EXAM: PORTABLE CHEST 1 VIEW COMPARISON:  Jun 21, 2022 FINDINGS: The cardiomediastinal silhouette is unchanged in contour.LEFT-sided chest tube. LEFT neck CVC tip terminates over the SVC. ETT tip terminates 6.7 cm above the carina. Enteric tube tip and side port project over the esophagus. Favored trace LEFT pleural effusion. No significant pneumothorax. Bibasilar atelectasis. IMPRESSION: 1. Favored trace LEFT pleural effusion. No significant pneumothorax. 2. Enteric tube tip and side port project over the esophagus. Recommend advancement. Electronically Signed   By: Meda Klinefelter M.D.   On: 06/23/2022 14:00   DG Chest Port 1 View  Result Date: 06/21/2022 CLINICAL DATA:  Left pneumothorax EXAM: PORTABLE CHEST 1 VIEW COMPARISON:  Previous studies including the examination done earlier today FINDINGS: There is interval decrease in size of small  left apical pneumothorax. There is tiny residual pneumothorax in the medial left apex in the current study. Cardiac size is within normal limits. There are no signs of pulmonary edema or new focal infiltrates. Costophrenic angles are clear. Tip of left chest tube is seen in the medial left lower lung field. Tip of NG tube is seen in the fundus of the stomach. Tip of left IJ central venous catheter is seen in superior vena cava. Tip of endotracheal tube is 5.5 cm above the carina. IMPRESSION: There is interval decrease in size of left pneumothorax with tiny residual pneumothorax in the medial left apex. There are no new infiltrates or signs of pulmonary edema. Electronically Signed   By: Ernie Avena M.D.   On: 06/21/2022 13:24    DG CHEST PORT 1 VIEW  Result Date: 06/21/2022 CLINICAL DATA:  Pneumothorax EXAM: PORTABLE CHEST 1 VIEW COMPARISON:  CXR 06/20/22 FINDINGS: Left-sided pleural pigtail drainage catheter in place with unchanged positioning. There is a small left apical pneumothorax, likely decreased in size compared prior exam. Left-sided central venous catheter with unchanged positioning. Enteric tube courses below diaphragm with the tip out of the field of view. Endotracheal tube terminates approximately 5 cm above the carina. No pleural effusion. No focal airspace opacity. Unchanged cardiac and mediastinal contours. No radiographically apparent new displaced rib fractures. Visualized upper abdomen is unremarkable. IMPRESSION: Small left apical pneumothorax, likely decreased in size compared to prior exam. Thoracostomy tube in place. Electronically Signed   By: Lorenza Cambridge M.D.   On: 06/21/2022 10:01   DG Chest Port 1 View  Result Date: 06/20/2022 CLINICAL DATA:  Chest tube placement. EXAM: PORTABLE CHEST 1 VIEW COMPARISON:  One-view chest x-ray 06/20/2022 at 12:25 p.m. FINDINGS: The heart is enlarged. Endotracheal tube is stable. Left IJ catheter is stable. Gastric tube terminates in the fundus the stomach. A left sided pleural pigtail catheter was placed. The pneumothorax is reduced but not eliminated. IMPRESSION: 1. Interval placement of left-sided pleural pigtail catheter with reduction in left-sided pneumothorax. 2. Stable cardiomegaly without failure. Electronically Signed   By: Marin Roberts M.D.   On: 06/20/2022 17:50   DG CHEST PORT 1 VIEW  Addendum Date: 06/20/2022   ADDENDUM REPORT: 06/20/2022 16:05 ADDENDUM: Critical Value/emergent results were called by telephone at the time of interpretation on 06/20/2022 at 4:03 pm to provider Priscella Mann, RN, who verbally acknowledged these results. Electronically Signed   By: Signa Kell M.D.   On: 06/20/2022 16:05   Result Date: 06/20/2022 CLINICAL  DATA:  Hypoxia. EXAM: PORTABLE CHEST 1 VIEW COMPARISON:  06/18/2018 for FINDINGS: ETT tip is stable above the carina. There is an enteric tube with tip coursing below the field of view. Left IJ catheter tip is in the distal SVC. There is a new moderate left-sided pneumothorax overlying the apex and lateral basal left lung. No signs of pleural effusion or edema. Persistent opacities within the right middle lobe. IMPRESSION: 1. New moderate left-sided pneumothorax. 2. Stable support apparatus. 3. Persistent right middle lobe opacities. Electronically Signed: By: Signa Kell M.D. On: 06/20/2022 15:41   CT HEAD WO CONTRAST ( )  Result Date: 06/20/2022 CLINICAL DATA:  Neuro deficit with acute stroke suspected EXAM: CT HEAD WITHOUT CONTRAST TECHNIQUE: Contiguous axial images were obtained from the base of the skull through the vertex without intravenous contrast. RADIATION DOSE REDUCTION: This exam was performed according to the departmental dose-optimization program which includes automated exposure control, adjustment of the mA and/or kV according to patient size  and/or use of iterative reconstruction technique. COMPARISON:  Yesterday FINDINGS: Brain: Large ICH centered at the right basal ganglia and adjacent white matter, with tracking towards the right brainstem is unchanged in shape and size, measuring up to 5 cm anterior to posterior and 4 cm craniocaudal. Regional edema is unchanged. Extensive cortical infarct in the right posterior frontal and parietal lobe. Smaller but more apparent left parietal cortex infarcts since brain MRI yesterday. Midline shift is 5 mm. Vascular: No hyperdense vessel or unexpected calcification. Skull: Normal. Negative for fracture or focal lesion. Sinuses/Orbits: No acute finding. IMPRESSION: 1. Multiple infarcts by brain MRI yesterday, suspect progressive cortex infarction in the left parietal lobe. 2. Unchanged appearance of right ICH with adjacent edema. Midline shift is 5  mm. Electronically Signed   By: Tiburcio Pea M.D.   On: 06/20/2022 06:09   CT HEAD WO CONTRAST ( )  Result Date: 06/19/2022 CLINICAL DATA:  Follow-up ICH EXAM: CT HEAD WITHOUT CONTRAST TECHNIQUE: Contiguous axial images were obtained from the base of the skull through the vertex without intravenous contrast. RADIATION DOSE REDUCTION: This exam was performed according to the departmental dose-optimization program which includes automated exposure control, adjustment of the mA and/or kV according to patient size and/or use of iterative reconstruction technique. COMPARISON:  MRI from earlier today. FINDINGS: Brain: Large acute hemorrhage centered at the right basal ganglia and adjacent white matter without progression in size or shape when compared to prior. Cytotoxic edema most apparent in the right frontal parietal cortex, infarcts being under appreciated when compared to preceding brain MRI. Diffuse effacement of subarachnoid spaces, leftward midline shift of 5 mm. Unchanged mild dilatation of the left lateral ventricle. Vascular: No hyperdense vessel or unexpected calcification. Skull: Normal. Negative for fracture or focal lesion. Sinuses/Orbits: No acute finding. IMPRESSION: Right ICH and extensive cortical infarction that is stable from brain MRI earlier today. No new abnormality. Leftward midline shift of 5 mm. Electronically Signed   By: Tiburcio Pea M.D.   On: 06/19/2022 11:13   MR BRAIN W WO CONTRAST  Result Date: 06/19/2022 CLINICAL DATA:  Hemorrhagic stroke workup EXAM: MRI HEAD WITHOUT AND WITH CONTRAST TECHNIQUE: Multiplanar, multiecho pulse sequences of the brain and surrounding structures were obtained without and with intravenous contrast. CONTRAST:  6mL GADAVIST GADOBUTROL 1 MMOL/ML IV SOLN COMPARISON:  Head CT and CTA from 2 days prior FINDINGS: Brain: Multiple areas of restricted diffusion most confluent in the right parietal and posterior temporal cortex. Restricted diffusion is  seen surrounding the large and known right basal ganglia hemorrhage which measures up to 5.3 cm anterior to posterior with cleft of fluid surrounding the hematoma. Infarcts in other arterial distributions are also present, including along the left cerebral convexity (especially parietooccipital)and at the right brainstem. Midline shift measures 5 mm. There is periventricular FLAIR hyperintensity suggesting chronic small vessel disease. There is asymmetric mild dilatation of the left temporal horn. Vascular: Filling defect in the right sigmoid sinus and upper internal jugular vein. Skull and upper cervical spine: Normal marrow signal Sinuses/Orbits: Mild mucosal thickening in the mastoid air cells. Retention cysts in the right maxillary sinus. Diffuse mucosal thickening in the paranasal sinuses. Negative orbits. IMPRESSION: 1. 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 The most confluent area is in the right MCA territory with infarct likely underlying the right basal ganglia hematoma. 2. 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 The pattern of infarcts and hemorrhage do not correlate  with the clot location however. 3. Mild dilatation of the temporal horn of the left lateral ventricle. 5 mm of midline shift. Electronically Signed   By: Tiburcio Pea M.D.   On: 06/19/2022 06:59   DG Abd 1 View  Result Date: 06/18/2022 CLINICAL DATA:  Check gastric catheter placement EXAM: ABDOMEN - 1 VIEW COMPARISON:  Film from earlier in the same day. FINDINGS: Gastric catheter has been advanced and now lies completely within the stomach. Scattered large and small bowel gas is noted. IMPRESSION: Gastric catheter within the stomach as described. Electronically Signed   By: Alcide Clever M.D.   On: 06/18/2022 23:47   DG Abd 1 View  Result Date: 06/18/2022 CLINICAL DATA:  Feeding tube placement. EXAM: ABDOMEN - 1 VIEW  COMPARISON:  Jun 18, 2022 (6:44 p.m.) FINDINGS: A nasogastric tube is seen with its distal tip overlying the expected region of the body of the stomach. The distal side hole sits at the level of the gastroesophageal junction. The bowel gas pattern is normal. No radio-opaque calculi or other significant radiographic abnormality are seen. IMPRESSION: Nasogastric tube positioning, as described above. Further advancement of the NG tube by approximately 7 cm is recommended to decrease the risk of aspiration. Electronically Signed   By: Aram Candela M.D.   On: 06/18/2022 22:23   DG Abd Portable 1V  Result Date: 06/18/2022 CLINICAL DATA:  Check gastric catheter placement EXAM: PORTABLE ABDOMEN - 1 VIEW COMPARISON:  None Available. FINDINGS: Scattered large and small bowel gas is noted. Gastric catheter is noted with the tip in the stomach. Proximal side port lies in the distal esophagus. This should be advanced several cm deeper into the stomach. IMPRESSION: Gastric catheter as described. This should be advanced deeper into the stomach. Electronically Signed   By: Alcide Clever M.D.   On: 06/18/2022 21:24   DG CHEST PORT 1 VIEW  Result Date: 06/18/2022 CLINICAL DATA:  Evaluate central line placement EXAM: PORTABLE CHEST 1 VIEW COMPARISON:  Jun 18, 2022 FINDINGS: The side port of the NG tube is near the GE junction. The distal tip is in the stomach. The ETT is in good position. A left central line is been placed in the interval terminating in the central SVC. No pneumothorax. The left lung is clear. Right middle lobe infiltrate remains. No other interval changes. IMPRESSION: 1. Support apparatus as above. The side port of the NG tube is near the GE junction. Recommend advancing the tube. 2. The left lung is clear. Right middle lobe infiltrate remains. 3. No other interval changes. Electronically Signed   By: Gerome Sam III M.D.   On: 06/18/2022 17:20   Overnight EEG with video  Result Date:  06/18/2022 Charlsie Quest, MD     06/19/2022  9:37 AM Patient Name: CLIFFORD VOEGELE MRN: [id number] Epilepsy Attending: Charlsie Quest Referring Physician/Provider: Erick Blinks, MD Duration: 06/27/2022 1215 to 06/18/2022 1215 Patient history: 47 y.o. female with hx of cocaine use p/w unresponsive and large R IPH with brain compression and 5mm midline shift. EEG to evaluate for seizure Level of alertness: Awake, asleep AEDs during EEG study: None Technical aspects: This EEG study was done with scalp electrodes positioned according to the 10-20 International system of electrode placement. Electrical activity was reviewed with band pass filter of 1-70Hz , sensitivity of 7 uV/mm, display speed of 42mm/sec with a 60Hz  notched filter applied as appropriate. EEG data were recorded continuously and digitally stored.  Video monitoring was available and reviewed  as appropriate. Description: The posterior dominant rhythm consists of 7.5 Hz activity of moderate voltage (25-35 uV) seen predominantly in posterior head regions, symmetric and reactive to eye opening and eye closing. Sleep was characterized by vertex waves, sleep spindles (12 to 14 Hz), maximal frontocentral region. EEG showed continuous 3 to 6 Hz theta-delta slowing in right hemisphere, maximal right temporal region. Intermittent generalized 3-6hz  theta-delta slowing was also noted. Hyperventilation and photic stimulation were not performed.   ABNORMALITY - Continuous slow, right hemisphere, maximal right temporal region - Intermittent slow, generalized IMPRESSION: This study is suggestive of cortical dysfunction arising from right hemisphere, maximal right temporal region likely secondary to underlying structural abnormality. Additionally there is mild to moderate diffuse encephalopathy. No seizures or epileptiform discharges were seen throughout the recording. Charlsie Quest   DG Chest Port 1 View  Result Date: 06/18/2022 CLINICAL DATA:  Pulmonary  infiltrates EXAM: PORTABLE CHEST 1 VIEW COMPARISON:  06/27/2022 FINDINGS: Endotracheal tube is in stable position. NG tube tip in the proximal stomach with the side port near the GE junction. The NG tube appears to be buckled/looped within the neck near the thoracic inlet. Heart mediastinal contours within normal limits. Right middle lobe airspace opacity again noted, stable. No confluent opacity on the left. No effusions or acute bony abnormality. IMPRESSION: Continued right middle lobe infiltrate, unchanged. NG tube appears to be buckled slashed looped in the upper esophagus near the thoracic inlet. Tip is in the proximal stomach. Electronically Signed   By: Charlett Nose M.D.   On: 06/18/2022 07:11   DG Abd 1 View  Result Date: 06/13/2022 CLINICAL DATA:  Nasogastric tube placement. EXAM: ABDOMEN - 1 VIEW COMPARISON:  Radiograph earlier today. FINDINGS: Tip of the enteric tube is below the diaphragm in the stomach, the side port is just at the gastroesophageal junction. Normal upper abdominal bowel gas pattern. IMPRESSION: Tip of the enteric tube below the diaphragm in the stomach, side-port just at the gastroesophageal junction. Electronically Signed   By: Narda Rutherford M.D.   On: 06/30/2022 15:43   ECHOCARDIOGRAM COMPLETE  Result Date: 07/05/2022    ECHOCARDIOGRAM REPORT   Patient Name:   Rebecca [name] Date of Exam: 06/24/2022 Medical Rec #:  [id number]          Height:       65.0 in Accession #:    [id number]         Weight:       140.0 lb Date of Birth:  10/09/1975          BSA:          1.700 m Patient Age:    46 years           BP:           122/111 mmHg Patient Gender: F                  HR:           102 bpm. Exam Location:  Inpatient Procedure: 2D Echo, Cardiac Doppler and Color Doppler Indications:    Stroke  History:        Patient has no prior history of Echocardiogram examinations.                 ICH, sepsis; Risk Factors:Current Smoker and Substance abuse.  Sonographer:    Wallie Char Referring Phys: [id number] [facility]  Sonographer Comments: Technically challenging study due to limited acoustic windows and echo performed with  patient supine and on artificial respirator. IMPRESSIONS  1. Left ventricular ejection fraction, by estimation, is 50 to 55%. The left ventricle has low normal function. The left ventricle has no regional wall motion abnormalities. There is moderate left ventricular hypertrophy. Left ventricular diastolic parameters are consistent with Grade I diastolic dysfunction (impaired relaxation).  2. Right ventricular systolic function is normal. The right ventricular size is normal. There is mildly elevated pulmonary artery systolic pressure.  3. The mitral valve is normal in structure. Trivial mitral valve regurgitation. No evidence of mitral stenosis.  4. The aortic valve is tricuspid. Aortic valve regurgitation is not visualized. No aortic stenosis is present. FINDINGS  Left Ventricle: Left ventricular ejection fraction, by estimation, is 50 to 55%. The left ventricle has low normal function. The left ventricle has no regional wall motion abnormalities. The left ventricular internal cavity size was normal in size. There is moderate left ventricular hypertrophy. Left ventricular diastolic parameters are consistent with Grade I diastolic dysfunction (impaired relaxation). Right Ventricle: The right ventricular size is normal. No increase in right ventricular wall thickness. Right ventricular systolic function is normal. There is mildly elevated pulmonary artery systolic pressure. The tricuspid regurgitant velocity is 2.74  m/s, and with an assumed right atrial pressure of 8 mmHg, the estimated right ventricular systolic pressure is 38.0 mmHg. Left Atrium: Left atrial size was normal in size. Right Atrium: Right atrial size was normal in size. Pericardium: There is no evidence of pericardial effusion. Mitral Valve: The mitral valve is normal in structure. Trivial  mitral valve regurgitation. No evidence of mitral valve stenosis. MV peak gradient, 5.3 mmHg. The mean mitral valve gradient is 2.0 mmHg. Tricuspid Valve: The tricuspid valve is normal in structure. Tricuspid valve regurgitation is trivial. Aortic Valve: The aortic valve is tricuspid. Aortic valve regurgitation is not visualized. No aortic stenosis is present. Aortic valve mean gradient measures 4.0 mmHg. Aortic valve peak gradient measures 6.2 mmHg. Aortic valve area, by VTI measures 2.39 cm. Pulmonic Valve: The pulmonic valve was normal in structure. Pulmonic valve regurgitation is not visualized. Aorta: The aortic root and ascending aorta are structurally normal, with no evidence of dilitation. IAS/Shunts: The interatrial septum was not well visualized.  LEFT VENTRICLE PLAX 2D LVIDd:         4.50 cm     Diastology LVIDs:         3.30 cm     LV e' medial:    6.63 cm/s LV PW:         1.10 cm     LV E/e' medial:  11.0 LV IVS:        1.00 cm     LV e' lateral:   5.86 cm/s LVOT diam:     1.90 cm     LV E/e' lateral: 12.4 LV SV:         40 LV SV Index:   23 LVOT Area:     2.84 cm  LV Volumes (MOD) LV vol d, MOD A2C: 68.4 ml LV vol d, MOD A4C: 77.5 ml LV vol s, MOD A2C: 33.9 ml LV vol s, MOD A4C: 37.8 ml LV SV MOD A2C:     34.5 ml LV SV MOD A4C:     77.5 ml LV SV MOD BP:      39.2 ml RIGHT VENTRICLE             IVC RV S prime:     13.90 cm/s  IVC diam: 1.50 cm TAPSE (M-mode):  1.6 cm LEFT ATRIUM             Index        RIGHT ATRIUM           Index LA diam:        3.00 cm 1.76 cm/m   RA Area:     10.40 cm LA Vol (A2C):   23.2 ml 13.65 ml/m  RA Volume:   20.40 ml  12.00 ml/m LA Vol (A4C):   19.2 ml 11.29 ml/m LA Biplane Vol: 21.2 ml 12.47 ml/m  AORTIC VALVE AV Area (Vmax):    2.85 cm AV Area (Vmean):   2.41 cm AV Area (VTI):     2.39 cm AV Vmax:           124.00 cm/s AV Vmean:          93.450 cm/s AV VTI:            0.166 m AV Peak Grad:      6.2 mmHg AV Mean Grad:      4.0 mmHg LVOT Vmax:         124.50 cm/s  LVOT Vmean:        79.500 cm/s LVOT VTI:          0.140 m LVOT/AV VTI ratio: 0.84  AORTA Ao Root diam: 3.30 cm Ao Asc diam:  2.80 cm MITRAL VALVE               TRICUSPID VALVE MV Area (PHT): 4.06 cm    TR Peak grad:   30.0 mmHg MV Area VTI:   1.72 cm    TR Vmax:        274.00 cm/s MV Peak grad:  5.3 mmHg MV Mean grad:  2.0 mmHg    SHUNTS MV Vmax:       1.15 m/s    Systemic VTI:  0.14 m MV Vmean:      68.2 cm/s   Systemic Diam: 1.90 cm MV Decel Time: 187 msec MV E velocity: 72.90 cm/s MV A velocity: 89.60 cm/s MV E/A ratio:  0.81 Epifanio Lesches MD Electronically signed by Epifanio Lesches MD Signature Date/Time: 07/02/2022/10:47:05 AM    Final    CT VENOGRAM HEAD  Result Date: 06/12/2022 CLINICAL DATA:  Intracranial hemorrhage. EXAM: CT VENOGRAM HEAD TECHNIQUE: Venographic phase images of the brain were obtained following the administration of intravenous contrast. Multiplanar reformats and maximum intensity projections were generated. RADIATION DOSE REDUCTION: This exam was performed according to the departmental dose-optimization program which includes automated exposure control, adjustment of the mA and/or kV according to patient size and/or use of iterative reconstruction technique. CONTRAST:  75mL OMNIPAQUE IOHEXOL 350 MG/ML SOLN COMPARISON:  None Available. FINDINGS: Filling defect of the right transverse sinus and right transverse sigmoid junction which are somewhat large and tubular for arachnoid granulations, although an arachnoid granulation at the left transverse sigmoid junction appears nearly similar. The location would also not explain the acute hemorrhage. No visible cortical or central venous thrombosis. IMPRESSION: No cortical or central venous thrombosis to explain the ICH. There are filling defects at the right transverse and transverse sigmoid dural sinuses which are prominent for arachnoid granulations but not definitive for thrombus. Attention at follow-up imaging. Electronically  Signed   By: Tiburcio Pea M.D.   On: 07/07/2022 05:30   CT ANGIO HEAD NECK W WO CM  Result Date: 06/11/2022 CLINICAL DATA:  Found unresponsive. Intracranial hemorrhage. History of cocaine use EXAM: CT ANGIOGRAPHY HEAD AND  NECK WITH AND WITHOUT CONTRAST TECHNIQUE: Multidetector CT imaging of the head and neck was performed using the standard protocol during bolus administration of intravenous contrast. Multiplanar CT image reconstructions and MIPs were obtained to evaluate the vascular anatomy. Carotid stenosis measurements (when applicable) are obtained utilizing NASCET criteria, using the distal internal carotid diameter as the denominator. RADIATION DOSE REDUCTION: This exam was performed according to the departmental dose-optimization program which includes automated exposure control, adjustment of the mA and/or kV according to patient size and/or use of iterative reconstruction technique. CONTRAST:  75mL OMNIPAQUE IOHEXOL 350 MG/ML SOLN COMPARISON:  Head CT from earlier today FINDINGS: CTA NECK FINDINGS Aortic arch: Unremarkable Right carotid system: Mixed density plaque at the bifurcation with some low-density plaque bulging into the ICA bulb. No ulceration or flow limiting stenosis Left carotid system: Mainly low-density plaque at the bifurcation without stenosis or ulceration. Vertebral arteries: No proximal subclavian stenosis. The vertebral arteries are somewhat tortuous but smoothly contoured and diffusely patent Skeleton: No acute or aggressive finding. Other neck: No acute finding Upper chest: Mild opacification of dependent lungs which could be atelectasis. Centrilobular emphysema. Review of the MIP images confirms the above findings CTA HEAD FINDINGS Anterior circulation: No aneurysm or spot sign seen underlying the ICH. There is a aneurysm projecting leftward from the left carotid terminus which measures 4 mm. No adjacent hemorrhage. No major branch occlusion. There is undulation of the  bilateral intracranial branches which is likely atheromatous given findings in the neck. No segmental beading. Some less intense flow and right MCA branches is likely related to mass effect by the ICH. Posterior circulation: The vertebral and basilar arteries arediffusely patent. Undulation of the left more than right PCA with up to moderate narrowing on the left, likely atheromatous given the findings in the neck. Venous sinuses: Reference dedicated CT venogram Anatomic variants: No acute finding Review of the MIP images confirms the above findings IMPRESSION: 1. No vascular lesion or spot sign seen at the acute ICH. 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 3. 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 Electronically Signed   By: Tiburcio Pea M.D.   On: 06/29/2022 05:18   CT Head Wo Contrast  Result Date: 06/25/2022 CLINICAL DATA:  Altered mental status EXAM: CT HEAD WITHOUT CONTRAST TECHNIQUE: Contiguous axial images were obtained from the base of the skull through the vertex without intravenous contrast. RADIATION DOSE REDUCTION: This exam was performed according to the departmental dose-optimization program which includes automated exposure control, adjustment of the mA and/or kV according to patient size and/or use of iterative reconstruction technique. COMPARISON:  None Available. FINDINGS: Brain: There is a large area of parenchymal hemorrhage identified in the right cerebral hemisphere centered primarily within the basal ganglia and extending superiorly into the centrum semi ovale. This measures approximately 4.7 x 4.0 cm in greatest AP and transverse dimensions respectively. It extends for approximately 4.7 cm in craniocaudad dimension. Significant mass-effect upon the right lateral ventricle is noted. Mild midline shift of approximately 5 mm is noted. Some surrounding edema is seen as well. No other hemorrhage is noted. Relative loss of the sulcal markings is noted likely  related to some increased intracranial pressure. Vascular: No hyperdense vessel or unexpected calcification. Skull: Normal. Negative for fracture or focal lesion. Sinuses/Orbits: No acute finding. Other: None. IMPRESSION: Right-sided intraparenchymal hemorrhage as described with associated edema and mild midline shift of 5 mm from right to left. Critical Value/emergent results were called by telephone at the time of interpretation  on 06/26/2022 at 2:27 am to Dr. Donna Bernard , who verbally acknowledged these results. Electronically Signed   By: Alcide Clever M.D.   On: 06/21/2022 02:29   DG Abdomen 1 View  Result Date: 07/05/2022 CLINICAL DATA:  Check gastric catheter placement EXAM: ABDOMEN - 1 VIEW COMPARISON:  None Available. FINDINGS: Gastric catheter is noted extending into the stomach. Proximal side port lies at the gastroesophageal junction. This should be advanced further into the stomach. No free air is seen. No bony abnormality is noted. IMPRESSION: Gastric catheter as described. Electronically Signed   By: Alcide Clever M.D.   On: 06/26/2022 02:16   DG Chest Port 1 View  Result Date: 06/10/2022 CLINICAL DATA:  Check endotracheal tube placement EXAM: PORTABLE CHEST 1 VIEW COMPARISON:  10/21/2020 FINDINGS: Cardiac shadow is within normal limits. Endotracheal tube is noted 1.5 cm above the carina. Gastric catheter is noted extending into the stomach. Patchy increased density is noted in the right middle lobe beneath the minor fissure consistent with acute infiltrate. No sizable effusion is noted. No bony abnormality is seen. IMPRESSION: Right middle lobe infiltrate. Tubes and lines as described. Electronically Signed   By: Alcide Clever M.D.   On: 06/09/2022 02:16     PHYSICAL EXAM  Temp:  [98 F (36.7 C)-100.5 F (38.1 C)] 98 F (36.7 C) (05/24 0713) Pulse Rate:  [51-74] 53 (05/24 0930) Resp:  [14-30] 20 (05/24 0930) BP: (108-149)/(61-92) 139/81 (05/24 1103) SpO2:  [93 %-98 %] 96 % (05/24  0930) FiO2 (%):  [40 %] 40 % (05/24 1052) Weight:  [67.1 kg] 67.1 kg (05/24 0500)   General:  She is s/p tracheostomy.  On ventilator.  Not sedated   Neuro: Eyes are closed.  Unresponsive.    .  Does not open eyes to command, unable to nod yes or no to questions.  Will barely open eyes to stimuli but not track examiner.  Pupils right is 3 mm and left is 2 mm in both unreactive to light.  Does not track examiner.  Oculocephalic intact.  Gag cough intact.  Corneals positive on the right absent on the left.  No movement in the left arm or leg.   No withdrawal to painful stimuli.   Not following any commands    ASSESSMENT/PLAN Rebecca [name] is a 47 y.o. female with history of cocaine abuse admitted for unresponsiveness.  Intubated in ER for airway protection.  No tPA given due to ICH.    ICH:  right BG and CR ICH, still more likely due to cocaine induced hypertension vs. 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 Vasospasm from ICH vs. 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  2D Echo EF 50 to 55% LDL 65 HgbA1c 5.9 UDS positive for cocaine Heparin subcu for VTE prophylaxis  No antithrombotic prior to admission, now on No antithrombotic due to ICH. Therapy  recommendations: Pending Disposition: Pending, prognosis poor, palliative care on board. Family meeting held again today.  Continue full support and care.  Cerebral edema CT right BG and CR large ICH, 5 mm midline shift CT repeat 5/12 stable ICH with leftward midline shift of 5 mm CT repeat 5/13 unchanged appearance of right ICH with adjacent edema. Midline shift is 5 mm. Na 151->155->157->159-> 155->158->150->159->162->161->159->158->155 -> 154>153 off 3% saline Na monitoring LTM EEG cortical dysfunction arising from right hemisphere with diffuse encephalopathy, no seizures  Respiratory failure Aspiration pneumonia Leukocytosis Pneumothorax  Intubated on vent Still on fentanyl, low dose CCM on board CXR right middle lobe infiltrate On azithromycin and Rocephin -> unasyn and zyvox Tmax 100.6->100.3->afebrile->100.3 -> 101.9 WBC 22.2->20.1->15.8->14.0->13.5->15.0 -> 16.5>20 S/p chest tube  Hypertension BP unstable Off cleviprex Labetalol and hydralazine IV PRN BP goal < 160 Long term BP goal normotensive Due to increased creatinine we will hold off on lisinopril for now.  Can use as needed to control blood pressure.  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  Hospital day # 14 Patient's neurological exam is quite poor with prognosis for survival and making meaningful neurological recovery being negligible.  Family understands this and has made her DNR and will likely withdraw ventilatory support later today and make her full  comfort care measures.  Discussed with Dr. Rito Ehrlich.This patient is critically ill and at significant risk of neurological worsening, death and care requires constant monitoring of vital signs, hemodynamics,respiratory and cardiac monitoring, extensive review of multiple databases, frequent neurological assessment, discussion with family, other specialists and medical decision making of high complexity.I have made any additions or clarifications directly to the above note.This critical care time does not reflect procedure time, or teaching time or supervisory time of PA/NP/Med Resident etc but could involve care discussion time.  I spent 30 minutes of neurocritical care time  in the care of  this patient.        Delia Heady, MD         To contact Stroke Continuity provider, please refer to [email or web address]. After hours, contact General Neurology

## 2022-07-09 NOTE — Progress Notes (Signed)
OT Cancellation Note  Patient Details Name: Rebecca [name] MRN: [id number] DOB: 11-19-1975   Cancelled Treatment:    Reason Eval/Treat Not Completed: Other (comment) (RN reports comfort care for this pt with no further OT needs at this time. Will sign off.)  Tyler Deis, OTR/L [facility] Acute Rehabilitation Office: [phone]   [name] 06/14/2022, 1:45 PM

## 2022-07-09 NOTE — Progress Notes (Signed)
[facility] PROGRESS NOTE   Rebecca [name]:[id number] DOB: February 25, 1975 DOA: 07/05/2022  PCP: Patient, No Pcp Per  Brief History/Interval Summary: 47 yo female with hx of polysubstance abuse presented brought to [facility] ED with AMS. Intubated for airway protection. CT head showed large Rt ICH with 5 mm midline shift. UDS positive for cocaine. Transferred to [facility] for further management.   Consultants: Neurology.  Critical care medicine.  Procedures: Tracheostomy.  Chest tube placement.    Subjective/Interval History: Patient remains unresponsive.   Assessment/Plan:  Acute right basal ganglia intraparenchymal hemorrhage/cerebral edema with brain compression/right MCA stroke/acute right sigmoid thrombosis Patient was admitted to the ICU.  She was intubated.  Seen by neurology. Underwent multiple imaging studies as outlined in the neurology note. Echocardiogram showed EF of 50 to 55%.  LDL 65.  HbA1c 5.9.  Urine drug screen positive for cocaine. Prognosis started to be very poor per neurology.  Palliative care was consulted.  Discussions were held with family.  Initially they wanted to continue full scope of care. D[place].  After further discussions patient was changed to DNR.  Family to visit this morning.  Plan is to start withdrawing care this afternoon after family has had a chance to spend some time with the patient. No anticoagulation due to intracranial hemorrhage.  Acute respiratory failure with hypoxia/bilateral multifocal pneumonia with MRSA/left pneumothorax status post chest tube Pulmonology is following.  Patient underwent tracheostomy placement on 5/20.  Requiring ventilator support. Patient previously completed Unasyn on 5/20.  Also completed linezolid on 5/22. Continues to have low-grade fever on and off. Blood cultures from 5/17 did not show any growth.  Sputum/tracheal aspirate cultures grew MRSA on 5/14. Plan is to withdraw care  this afternoon.  Acute kidney injury/hyponatremia Resolved.  Microcytic anemia/anemia of critical illness No evidence of overt bleeding.  Hemoglobin is stable.  Demand ischemia with elevated troponin/essential hypertension EF noted to be mildly low at 50 to 55%. Noted to be on amlodipine and carvedilol.  Nutrition Noted to be on tube feedings.  Interventional radiology was consulted for PEG tube placement.  However due to overlying: They cannot do it.  Plan was to consult general surgery however no plans to withdraw care later today.  Goals of care Palliative care following.  Discussions held with patient's daughter yesterday after she came in from out of state.  Patient was changed to DNR.  Family to visit this morning with possible withdrawal of care this afternoon.  DVT Prophylaxis: Subcutaneous heparin Code Status: Full code Family Communication: No family at bedside Disposition Plan: Likely withdrawal of care this afternoon.  Will need to see how she does once ventilator support is withdrawn.  Chest tube will also need to be removed.  Status is: Inpatient Remains inpatient appropriate because: Acute stroke      Medications: Scheduled:  amLODipine  10 mg Per Tube Daily   carvedilol  3.125 mg Per Tube BID WC   Chlorhexidine Gluconate Cloth  6 each Topical Daily   feeding supplement (PROSource TF20)  60 mL Per Tube Daily   fiber supplement (BANATROL TF)  60 mL Per Tube BID   folic acid  1 mg Per Tube Daily   free water  200 mL Per Tube Q4H   heparin injection (subcutaneous)  5,000 Units Subcutaneous Q8H   multivitamin with minerals  1 tablet Per Tube Daily   mouth rinse  15 mL Mouth Rinse Q2H   pantoprazole (PROTONIX) IV  40 mg Intravenous QHS   sodium chloride flush  10 mL Intrapleural Q8H   thiamine  100 mg Per Tube Daily   Continuous:  sodium chloride Stopped (06/28/22 1214)   feeding supplement (VITAL 1.5 CAL) 45 mL/hr at 06/19/2022 0900   [name]:WRUEAV chloride,  acetaminophen **OR** acetaminophen (TYLENOL) oral liquid 160 mg/5 mL **OR** acetaminophen, docusate, hydrALAZINE, ipratropium-albuterol, mouth rinse, oxyCODONE, senna-docusate  Antibiotics: Anti-infectives (From admission, onward)    Start     Dose/Rate Route Frequency Ordered Stop   06/22/22 1000  linezolid (ZYVOX) IVPB 600 mg        600 mg 300 mL/hr over 60 Minutes Intravenous Every 12 hours 06/22/22 0754 06/29/22 2212   06/20/22 0900  Ampicillin-Sulbactam (UNASYN) 3 g in sodium chloride 0.9 % 100 mL IVPB  Status:  Discontinued        3 g 200 mL/hr over 30 Minutes Intravenous Every 6 hours 06/20/22 0759 06/26/22 1456   06/15/2022 0830  cefTRIAXone (ROCEPHIN) 2 g in sodium chloride 0.9 % 100 mL IVPB  Status:  Discontinued        2 g 200 mL/hr over 30 Minutes Intravenous Every 24 hours 06/20/2022 0750 06/20/22 0744   06/13/2022 0830  azithromycin (ZITHROMAX) 500 mg in sodium chloride 0.9 % 250 mL IVPB  Status:  Discontinued        500 mg 250 mL/hr over 60 Minutes Intravenous Every 24 hours 06/23/2022 0750 06/20/22 0744       Objective:  Vital Signs  Vitals:   06/12/2022 0800 06/09/2022 0830 06/08/2022 0839 07/05/2022 0900  BP: 138/81 138/83 138/83 126/82  Pulse: 61 63 74 70  Resp: (!) 22 18  20   Temp:      TempSrc:      SpO2: 94% 97%  98%  Weight:      Height:        Intake/Output Summary (Last 24 hours) at 06/10/2022 0930 Last data filed at 07/07/2022 0900 Gross per 24 hour  Intake 1012 ml  Output 1600 ml  Net -588 ml    Filed Weights   06/29/22 0500 06/30/22 0433 07/07/2022 0500  Weight: 68 kg 66.6 kg 67.1 kg    General appearance: Unresponsive Resp: Coarse sounds bilaterally.  Left-sided chest tube noted.  Tracheostomy noted. Cardio: S1-S2 is normal regular.  No S3-S4.  No rubs murmurs or bruit GI: Abdomen is soft.  Nontender nondistended.  Bowel sounds are present normal.  No masses organomegaly   Lab Results:  Data Reviewed: I have personally reviewed following labs and  reports of the imaging studies  CBC: Recent Labs  Lab 06/25/22 0349 06/27/22 0355 06/28/22 1003 06/30/22 0010  WBC 20.7* 12.4* 11.5* 15.1*  HGB 7.6* 7.3* 8.2* 8.2*  HCT 26.3* 25.2* 28.5* 28.2*  MCV 75.8* 76.1* 76.0* 75.2*  PLT 354 380 433* 547*     Basic Metabolic Panel: Recent Labs  Lab 06/26/22 0955 06/27/22 0355 06/28/22 1003 06/30/22 0010 06/08/2022 0109  NA 154* 151* 149* 145 143  K 3.5 3.3* 3.5 3.5 4.1  CL 120* 118* 116* 111 110  CO2 26 26 22 23 24   GLUCOSE 120* 104* 125* 118* 116*  BUN 33* 30* 26* 24* 26*  CREATININE 1.00 0.86 0.80 0.72 0.60  CALCIUM 8.6* 8.4* 8.6* 8.8* 8.9  MG  --  2.5*  --  2.1 2.3  PHOS  --  3.7  --  4.4 4.1     GFR: Estimated Creatinine Clearance: 78.2 mL/min (by C-G formula based on SCr  of 0.6 mg/dL).  Liver Function Tests: Recent Labs  Lab 06/30/22 0010 07/06/2022 0109  ALBUMIN 1.9* 1.8*      CBG: Recent Labs  Lab 06/25/22 0750 06/25/22 1151 06/25/22 1530 06/26/22 1952 06/28/22 0737  GLUCAP 97 87 102* 112* 117*      Recent Results (from the past 240 hour(s))  Culture, blood (Routine X 2) w Reflex to ID Panel     Status: None   Collection Time: 06/24/22  9:49 PM   Specimen: BLOOD LEFT HAND  Result Value Ref Range Status   Specimen Description BLOOD LEFT HAND  Final   Special Requests   Final    BOTTLES DRAWN AEROBIC AND ANAEROBIC Blood Culture results may not be optimal due to an excessive volume of blood received in culture bottles   Culture   Final    NO GROWTH 5 DAYS Performed at [facility], [address], [place], [place] [address]    Report Status 06/29/2022 FINAL  Final  Culture, blood (Routine X 2) w Reflex to ID Panel     Status: None   Collection Time: 06/24/22  9:52 PM   Specimen: BLOOD LEFT ARM  Result Value Ref Range Status   Specimen Description BLOOD LEFT ARM  Final   Special Requests   Final    BOTTLES DRAWN AEROBIC AND ANAEROBIC Blood Culture adequate volume   Culture   Final    NO  GROWTH 5 DAYS Performed at [facility], [address], [place], [place] [address]    Report Status 06/29/2022 FINAL  Final      Radiology Studies: DG Abd Portable 1V  Result Date: 06/30/2022 CLINICAL DATA:  Abdominal distension. EXAM: PORTABLE ABDOMEN - 1 VIEW COMPARISON:  CT 03/30/2022 FINDINGS: Enteric tube tip is identified within the left upper quadrant of the abdomen in the expected location of the body of stomach. Similar appearance of mild gaseous distension of the colon. No dilated small bowel loops noted. IMPRESSION: 1. Enteric tube tip within the body of stomach. 2. Similar appearance of mild gaseous distension of the colon. Electronically Signed   By: Signa Kell M.D.   On: 06/30/2022 14:17       LOS: 14 days   Sharaine Delange [employer] on [email or web address]  06/15/2022, 9:30 AM

## 2022-07-09 DEATH — deceased
